# Patient Record
Sex: Female | Born: 1945
Health system: Southern US, Community
[De-identification: ages and names within clinical notes are randomized; demographics above are authoritative.]

## PROBLEM LIST (undated history)

## (undated) DIAGNOSIS — F419 Anxiety disorder, unspecified: Secondary | ICD-10-CM

## (undated) DIAGNOSIS — D649 Anemia, unspecified: Secondary | ICD-10-CM

## (undated) DIAGNOSIS — Z630 Problems in relationship with spouse or partner: Secondary | ICD-10-CM

## (undated) DIAGNOSIS — E785 Hyperlipidemia, unspecified: Secondary | ICD-10-CM

## (undated) DIAGNOSIS — F329 Major depressive disorder, single episode, unspecified: Secondary | ICD-10-CM

## (undated) DIAGNOSIS — R42 Dizziness and giddiness: Secondary | ICD-10-CM

## (undated) DIAGNOSIS — R009 Unspecified abnormalities of heart beat: Secondary | ICD-10-CM

## (undated) DIAGNOSIS — K635 Polyp of colon: Secondary | ICD-10-CM

## (undated) DIAGNOSIS — R1013 Epigastric pain: Secondary | ICD-10-CM

## (undated) DIAGNOSIS — Z780 Asymptomatic menopausal state: Secondary | ICD-10-CM

## (undated) DIAGNOSIS — K219 Gastro-esophageal reflux disease without esophagitis: Secondary | ICD-10-CM

## (undated) DIAGNOSIS — K2970 Gastritis, unspecified, without bleeding: Secondary | ICD-10-CM

## (undated) DIAGNOSIS — F32A Depression, unspecified: Secondary | ICD-10-CM

## (undated) DIAGNOSIS — M1990 Unspecified osteoarthritis, unspecified site: Secondary | ICD-10-CM

## (undated) DIAGNOSIS — I69398 Other sequelae of cerebral infarction: Secondary | ICD-10-CM

## (undated) DIAGNOSIS — I10 Essential (primary) hypertension: Secondary | ICD-10-CM

## (undated) DIAGNOSIS — R630 Anorexia: Secondary | ICD-10-CM

## (undated) DIAGNOSIS — R05 Cough: Secondary | ICD-10-CM

## (undated) HISTORY — DX: Essential (primary) hypertension: I10

## (undated) HISTORY — DX: Major depressive disorder, single episode, unspecified: F32.9

## (undated) HISTORY — DX: Depression, unspecified: F32.A

## (undated) HISTORY — DX: Epigastric pain: R10.13

## (undated) HISTORY — DX: Anorexia: R63.0

## (undated) HISTORY — DX: Hyperlipidemia, unspecified: E78.5

## (undated) HISTORY — DX: Cough: R05

## (undated) HISTORY — DX: Unspecified osteoarthritis, unspecified site: M19.90

## (undated) HISTORY — DX: Problems in relationship with spouse or partner: Z63.0

## (undated) HISTORY — PX: BREAST SURGERY: SHX581

## (undated) HISTORY — DX: Polyp of colon: K63.5

## (undated) HISTORY — DX: Gastritis, unspecified, without bleeding: K29.70

## (undated) HISTORY — DX: Gastro-esophageal reflux disease without esophagitis: K21.9

## (undated) HISTORY — DX: Unspecified abnormalities of heart beat: R00.9

## (undated) HISTORY — PX: MOUTH SURGERY: SHX715

## (undated) HISTORY — DX: Anxiety disorder, unspecified: F41.9

## (undated) HISTORY — DX: Dizziness and giddiness: R42

## (undated) HISTORY — DX: Other sequelae of cerebral infarction: I69.398

## (undated) HISTORY — DX: Asymptomatic menopausal state: Z78.0

## (undated) HISTORY — PX: TUBAL LIGATION: SHX77

## (undated) HISTORY — DX: Other sequelae of cerebral infarction: R42

## (undated) HISTORY — DX: Anemia, unspecified: D64.9

## (undated) HISTORY — PX: COLONOSCOPY: SHX174

---

## 2003-02-01 ENCOUNTER — Emergency Department (HOSPITAL_COMMUNITY): Admission: EM | Admit: 2003-02-01 | Discharge: 2003-02-01 | Payer: Self-pay | Admitting: Emergency Medicine

## 2003-08-19 ENCOUNTER — Emergency Department (HOSPITAL_COMMUNITY): Admission: AD | Admit: 2003-08-19 | Discharge: 2003-08-19 | Payer: Self-pay | Admitting: Emergency Medicine

## 2003-08-19 ENCOUNTER — Emergency Department (HOSPITAL_COMMUNITY): Admission: EM | Admit: 2003-08-19 | Discharge: 2003-08-20 | Payer: Self-pay

## 2003-08-22 ENCOUNTER — Encounter: Admission: RE | Admit: 2003-08-22 | Discharge: 2003-08-22 | Payer: Self-pay | Admitting: Internal Medicine

## 2004-09-02 ENCOUNTER — Emergency Department (HOSPITAL_COMMUNITY): Admission: EM | Admit: 2004-09-02 | Discharge: 2004-09-02 | Payer: Self-pay | Admitting: Emergency Medicine

## 2006-07-22 ENCOUNTER — Ambulatory Visit: Payer: Self-pay | Admitting: Cardiology

## 2010-07-15 ENCOUNTER — Other Ambulatory Visit: Payer: Self-pay | Admitting: Obstetrics and Gynecology

## 2010-07-15 DIAGNOSIS — R1906 Epigastric swelling, mass or lump: Secondary | ICD-10-CM

## 2010-07-17 ENCOUNTER — Ambulatory Visit
Admission: RE | Admit: 2010-07-17 | Discharge: 2010-07-17 | Disposition: A | Discharge status: Home / Self Care | Payer: Medicare Other | Source: Ambulatory Visit | Attending: Obstetrics and Gynecology | Admitting: Obstetrics and Gynecology

## 2010-07-17 DIAGNOSIS — R1906 Epigastric swelling, mass or lump: Secondary | ICD-10-CM

## 2010-07-17 MED ORDER — IOHEXOL 300 MG/ML  SOLN
100.0000 mL | Freq: Once | INTRAMUSCULAR | Status: AC | PRN
  Administered 2010-07-17: 100 mL via INTRAVENOUS

## 2010-09-18 ENCOUNTER — Encounter: Payer: Self-pay | Admitting: Gastroenterology

## 2010-09-18 ENCOUNTER — Ambulatory Visit (INDEPENDENT_AMBULATORY_CARE_PROVIDER_SITE_OTHER): Payer: Medicare Other | Admitting: Gastroenterology

## 2010-09-18 VITALS — BP 120/68 | HR 68 | Ht 63.0 in | Wt 120.0 lb

## 2010-09-18 DIAGNOSIS — R630 Anorexia: Secondary | ICD-10-CM

## 2010-09-18 DIAGNOSIS — R1013 Epigastric pain: Secondary | ICD-10-CM

## 2010-09-18 DIAGNOSIS — R634 Abnormal weight loss: Secondary | ICD-10-CM

## 2010-09-18 DIAGNOSIS — K5900 Constipation, unspecified: Secondary | ICD-10-CM

## 2010-09-18 MED ORDER — PEG-KCL-NACL-NASULF-NA ASC-C 100 G PO SOLR: 1.0000 | Freq: Once | ORAL | Status: AC

## 2010-09-18 MED ORDER — POLYETHYLENE GLYCOL 3350 17 GM/SCOOP PO POWD: ORAL | Status: DC

## 2010-09-18 NOTE — Progress Notes (Signed)
HPI: This is a  very pleasant 65 year old woman who has had upper abdominal pain, weight loss, anorexia over the past several months. She had blood tests done in March showing that she is not anemic. Her complete metabolic profile is normal. She also had a CT scan done of her abdomen pelvis and this was essentially normal except for some constipation.  Who has upper abdominal discomfort ("sore muscle") for four months.  +anorexia. She has lost 30 pounds in the past several months.  She was told she had ulcers and was put on PPI medicines.  She is not better since starting the proton pump inhibitor.  +early satiety.  These symptoms are all relatively new.  Never really takes a lot of pain medicines.  She has chronic constipation, for many many years.  Saw overt blood in stool one time 2 years ago.  She has never had colon cancer screening that I can tell.   Review of systems: Pertinent positive and negative review of systems were noted in the above HPI section.  All other review of systems was otherwise negative.   Past Medical History, Past Surgical History, Family History, Social History, Current Medications, Allergies were all reviewed with the patient via [facility] electronic medical record system.   Physical Exam: BP 120/68  Pulse 68  Ht 5\' 3"  (1.6 m)  Wt 120 lb (54.432 kg)  BMI 21.26 kg/m2 Constitutional: generally well-appearing Psychiatric: alert and oriented x3 Eyes: extraocular movements intact Mouth: oral pharynx moist, no lesions Neck: supple no lymphadenopathy Cardiovascular: heart regular rate and rhythm Lungs: clear to auscultation bilaterally Abdomen: soft, mild abdominal pain, midepigastrium, nondistended, no obvious ascites, no peritoneal signs, normal bowel sounds Extremities: no lower extremity edema bilaterally Skin: no lesions on visible extremities    Assessment and plan: 65 y.o. female with anorexia, weight loss, constipation, upper abdominal   Pain  We should proceed with colonoscopy as well as upper endoscopy. Her mother may have had gastric cancer. CT scan was underwhelming and her labs were essentially normal 2 months ago.

## 2010-09-18 NOTE — Patient Instructions (Signed)
You will be set up for a colonoscopy. You will be set up for an upper endoscopy. Until those tests, you should be on two scoops of miralax. A copy of this information will be made available to Dr. Modesto Charon.

## 2010-09-20 ENCOUNTER — Encounter: Payer: Self-pay | Admitting: Gastroenterology

## 2010-09-23 ENCOUNTER — Encounter: Payer: Self-pay | Admitting: Gastroenterology

## 2010-09-23 ENCOUNTER — Ambulatory Visit (AMBULATORY_SURGERY_CENTER): Payer: Medicare Other | Admitting: Gastroenterology

## 2010-09-23 VITALS — BP 132/86 | HR 61 | Temp 98.4°F | Resp 18 | Ht 63.0 in | Wt 120.0 lb

## 2010-09-23 DIAGNOSIS — D1330 Benign neoplasm of unspecified part of small intestine: Secondary | ICD-10-CM

## 2010-09-23 DIAGNOSIS — K3189 Other diseases of stomach and duodenum: Secondary | ICD-10-CM

## 2010-09-23 DIAGNOSIS — K2970 Gastritis, unspecified, without bleeding: Secondary | ICD-10-CM

## 2010-09-23 DIAGNOSIS — D126 Benign neoplasm of colon, unspecified: Secondary | ICD-10-CM

## 2010-09-23 DIAGNOSIS — K2990 Gastroduodenitis, unspecified, without bleeding: Secondary | ICD-10-CM

## 2010-09-23 DIAGNOSIS — Z800 Family history of malignant neoplasm of digestive organs: Secondary | ICD-10-CM

## 2010-09-23 DIAGNOSIS — R634 Abnormal weight loss: Secondary | ICD-10-CM

## 2010-09-23 LAB — HM COLONOSCOPY

## 2010-09-23 MED ORDER — SODIUM CHLORIDE 0.9 % IV SOLN: 500.0000 mL | INTRAVENOUS | Status: DC

## 2010-09-23 NOTE — Patient Instructions (Signed)
Findings:  Polyp, Hemorrhoids,  Gastritis  Teachings on Gastritis, Polyps,  And Hemorrhoids given to Pt and care giver  Discharge instructions explained and given to pt and care giver.

## 2010-09-24 ENCOUNTER — Telehealth: Payer: Self-pay

## 2010-09-24 NOTE — Telephone Encounter (Signed)

## 2012-02-05 ENCOUNTER — Encounter (HOSPITAL_COMMUNITY): Payer: Self-pay | Admitting: Emergency Medicine

## 2012-02-05 ENCOUNTER — Emergency Department (HOSPITAL_COMMUNITY): Payer: Medicare Other

## 2012-02-05 ENCOUNTER — Emergency Department (HOSPITAL_COMMUNITY)
Admission: EM | Admit: 2012-02-05 | Discharge: 2012-02-05 | Disposition: A | Discharge status: Home / Self Care | Payer: Medicare Other | Attending: Emergency Medicine | Admitting: Emergency Medicine

## 2012-02-05 DIAGNOSIS — Z886 Allergy status to analgesic agent status: Secondary | ICD-10-CM | POA: Insufficient documentation

## 2012-02-05 DIAGNOSIS — F3289 Other specified depressive episodes: Secondary | ICD-10-CM | POA: Insufficient documentation

## 2012-02-05 DIAGNOSIS — R079 Chest pain, unspecified: Secondary | ICD-10-CM

## 2012-02-05 DIAGNOSIS — F329 Major depressive disorder, single episode, unspecified: Secondary | ICD-10-CM | POA: Insufficient documentation

## 2012-02-05 DIAGNOSIS — F439 Reaction to severe stress, unspecified: Secondary | ICD-10-CM

## 2012-02-05 DIAGNOSIS — M129 Arthropathy, unspecified: Secondary | ICD-10-CM | POA: Insufficient documentation

## 2012-02-05 DIAGNOSIS — F4389 Other reactions to severe stress: Secondary | ICD-10-CM | POA: Insufficient documentation

## 2012-02-05 DIAGNOSIS — K219 Gastro-esophageal reflux disease without esophagitis: Secondary | ICD-10-CM | POA: Insufficient documentation

## 2012-02-05 DIAGNOSIS — M810 Age-related osteoporosis without current pathological fracture: Secondary | ICD-10-CM | POA: Insufficient documentation

## 2012-02-05 DIAGNOSIS — F438 Other reactions to severe stress: Secondary | ICD-10-CM | POA: Insufficient documentation

## 2012-02-05 DIAGNOSIS — I10 Essential (primary) hypertension: Secondary | ICD-10-CM | POA: Insufficient documentation

## 2012-02-05 DIAGNOSIS — F411 Generalized anxiety disorder: Secondary | ICD-10-CM | POA: Insufficient documentation

## 2012-02-05 LAB — BASIC METABOLIC PANEL
Chloride: 101 mEq/L (ref 96–112)
Creatinine, Ser: 0.82 mg/dL (ref 0.50–1.10)
GFR calc Af Amer: 85 mL/min — ABNORMAL LOW (ref >90)
Potassium: 4 mEq/L (ref 3.5–5.1)
Sodium: 135 mEq/L (ref 135–145)

## 2012-02-05 LAB — CBC
HCT: 37.3 % (ref 36.0–46.0)
Hemoglobin: 12.4 g/dL (ref 12.0–15.0)
MCV: 89.7 fL (ref 78.0–100.0)
Platelets: 317 10*3/uL (ref 150–400)
RBC: 4.16 MIL/uL (ref 3.87–5.11)
RDW: 13.5 % (ref 11.5–15.5)

## 2012-02-05 LAB — TROPONIN I: Troponin I: <0.3 ng/mL (ref <0.30)
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
## 2012-02-05 NOTE — ED Provider Notes (Signed)
History   This chart was scribed for Flint Melter, MD by Albertha Ghee Rifaie. This patient was seen in room APA12/APA12 and the patient's care was started at 19;15.  CSN: [id number]  Arrival date & time 02/05/12  4098   First MD Initiated Contact with Patient 02/05/12 1915      Chief Complaint  Patient presents with  . Chest Pain    The history is provided by the patient and a relative. No language interpreter was used.   Kathryn Stein is a 66 y.o. female who presents to the Emergency Department complaining of intermittent CP that radiates to the shoulder bilaterally and lasts about 5-10 min. Pain is worsening with breathing. She states taking aspirin but no relief.  Pt also c/o a week of intermittent neck pain. She denies taking any medications for it. She states taking citalopram for stress and meclizine. Pt reports having some stress at home attributed ther husband who aggravate her. She reports being told that she had a weak heart after her 3rd pregnancy. She denies fever, nausea, chills and emesis. Pt denies smoking and alcohol use.   Past Medical History  Diagnosis Date  . Depression   . Anorexia   . Dyspepsia   . Anemia   . Anxiety   . Arthritis   . Hypertension   . GERD (gastroesophageal reflux disease)   . Osteoporosis     Past Surgical History  Procedure Date  . Breast surgery     lt.  . Cesarean section   . Mouth surgery     bone extraction  . Colonoscopy     Family History  Problem Relation Age of Onset  . Colon cancer Mother   . Diabetes Mother     3 sisters  . Prostate cancer Brother   . Stomach cancer      niece  . Heart disease Sister   . Liver disease Brother   . Kidney cancer      niece    History  Substance Use Topics  . Smoking status: Never Smoker   . Smokeless tobacco: Never Used  . Alcohol Use: No    No OB history provided.   Review of Systems  All other systems reviewed and are negative.    Allergies  Codeine  Home  Medications   Current Outpatient Rx  Name Route Sig Dispense Refill  . ASPIRIN EC 81 MG PO TBEC Oral Take 81 mg by mouth daily as needed. For pain    . CALCIUM CITRATE-VITAMIN D 315-200 MG-UNIT PO TABS Oral Take 1 tablet by mouth 2 (two) times daily.      Marland Kitchen VITAMIN D 1000 UNITS PO TABS Oral Take 1,000 Units by mouth 2 (two) times daily.    Marland Kitchen CITALOPRAM HYDROBROMIDE 40 MG PO TABS Oral Take 40 mg by mouth daily.    Marland Kitchen MECLIZINE HCL 25 MG PO TABS Oral Take 25 mg by mouth every 8 (eight) hours as needed. For dizziness      BP 146/98  Pulse 75  Temp 98.1 F (36.7 C) (Oral)  Resp 20  Ht 5\' 5"  (1.651 m)  Wt 135 lb (61.236 kg)  BMI 22.47 kg/m2  SpO2 100%  Physical Exam  Nursing note and vitals reviewed. Constitutional: She is oriented to person, place, and time. She appears well-developed and well-nourished. No distress.  HENT:  Head: Normocephalic and atraumatic.  Eyes: Conjunctivae normal and EOM are normal.  Neck: Neck supple. No tracheal deviation present.  Cardiovascular: Normal rate, regular rhythm and normal heart sounds.        Normal heart   Pulmonary/Chest: Effort normal. No respiratory distress.       congestion in chest  Normal lungs.  Abdominal: She exhibits no distension.  Musculoskeletal: Normal range of motion.  Neurological: She is alert and oriented to person, place, and time. No sensory deficit.  Skin: Skin is dry.  Psychiatric: Her behavior is normal.       Seems depressed.     ED Course  Procedures (including critical care time) DIAGNOSTIC STUDIES: Oxygen Saturation is 100% on room air, normal by my interpretation.    COORDINATION OF CARE: 7:33 PM Discussed treatment plan with pt at bedside and pt agreed to plan. 9:05 PM Revaluated and pt reports feeling better, test results were shared as negative and pt was advised to follow up with PCP; discharge plans were discussed with pt at bedside and pt agreed to plans.   Date: 02/05/2012  Rate: 61  Rhythm:  normal sinus rhythm  QRS Axis: normal  Intervals: normal  ST/T Wave abnormalities: normal  Conduction Disutrbances:none  Narrative Interpretation:   Old EKG Reviewed: none available   Labs Reviewed  BASIC METABOLIC PANEL - Abnormal; Notable for the following:    GFR calc non Af Amer 73 (*)     GFR calc Af Amer 85 (*)     All other components within normal limits  CBC  TROPONIN I  LAB REPORT - SCANNED   Dg Chest Port 1 View  02/05/2012  *RADIOLOGY REPORT*  Clinical Data: Chest pain and shortness of breath.  PORTABLE CHEST - 1 VIEW  Comparison: Chest x-ray 09/02/2004.  Findings: Lung volumes are normal.  No consolidative airspace disease.  No definite pleural effusions.  Pulmonary vasculature is normal.  Heart size is upper limits of normal, and likely accentuated by patient rotation, lordotic positioning and portable AP technique.  Mediastinal contours are within normal limits allowing for the above factors.  IMPRESSION: 1.  No radiographic evidence of acute cardiopulmonary disease.   Original Report Authenticated By: Florencia Reasons, M.D.      1. Chest pain   2. Stress       MDM  Nonspecific CP with negative ED evaluation and underlying stress d/t issues with husband. Doubt ACS, PE, PNE. Doubt metabolic instability, serious bacterial infection or impending vascular collapse; the patient is stable for discharge.    I personally performed the services described in this documentation, which was scribed in my presence. The recorded information has been reviewed and considered.    Plan: Home Medications- usual; Home Treatments- rest, stress management; Recommended follow up- PCP f/u in 4-5 days        Flint Melter, MD 02/07/12 1151
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
## 2012-02-05 NOTE — ED Notes (Signed)
Pt c/o left chest/shoulder pain intermittently since 8am today.

## 2012-07-11 ENCOUNTER — Encounter: Payer: Self-pay | Admitting: Family Medicine

## 2012-07-11 DIAGNOSIS — Z860100 Personal history of colon polyps, unspecified: Secondary | ICD-10-CM | POA: Insufficient documentation

## 2012-07-11 DIAGNOSIS — F32A Depression, unspecified: Secondary | ICD-10-CM | POA: Insufficient documentation

## 2012-07-11 DIAGNOSIS — M810 Age-related osteoporosis without current pathological fracture: Secondary | ICD-10-CM | POA: Insufficient documentation

## 2012-07-11 DIAGNOSIS — F3289 Other specified depressive episodes: Secondary | ICD-10-CM

## 2012-07-11 DIAGNOSIS — F329 Major depressive disorder, single episode, unspecified: Secondary | ICD-10-CM | POA: Insufficient documentation

## 2012-07-24 ENCOUNTER — Other Ambulatory Visit: Payer: Self-pay | Admitting: *Deleted

## 2012-07-24 DIAGNOSIS — M810 Age-related osteoporosis without current pathological fracture: Secondary | ICD-10-CM

## 2012-07-30 ENCOUNTER — Encounter: Payer: Self-pay | Admitting: Family Medicine

## 2012-07-30 ENCOUNTER — Ambulatory Visit (INDEPENDENT_AMBULATORY_CARE_PROVIDER_SITE_OTHER): Payer: Medicare Other | Admitting: Family Medicine

## 2012-07-30 VITALS — BP 125/75 | HR 66 | Temp 97.4°F | Ht 63.0 in | Wt 144.0 lb

## 2012-07-30 DIAGNOSIS — F3289 Other specified depressive episodes: Secondary | ICD-10-CM

## 2012-07-30 DIAGNOSIS — Z13220 Encounter for screening for lipoid disorders: Secondary | ICD-10-CM

## 2012-07-30 DIAGNOSIS — F32A Depression, unspecified: Secondary | ICD-10-CM

## 2012-07-30 DIAGNOSIS — F329 Major depressive disorder, single episode, unspecified: Secondary | ICD-10-CM

## 2012-07-30 DIAGNOSIS — M810 Age-related osteoporosis without current pathological fracture: Secondary | ICD-10-CM

## 2012-07-30 DIAGNOSIS — F4329 Adjustment disorder with other symptoms: Secondary | ICD-10-CM

## 2012-07-30 DIAGNOSIS — F438 Other reactions to severe stress: Secondary | ICD-10-CM

## 2012-07-30 DIAGNOSIS — F4389 Other reactions to severe stress: Secondary | ICD-10-CM

## 2012-07-30 LAB — LIPID PANEL
Cholesterol: 201 mg/dL — ABNORMAL HIGH (ref 0–200)
HDL: 50 mg/dL (ref >39)
LDL Cholesterol: 139 mg/dL — ABNORMAL HIGH (ref 0–99)
Total CHOL/HDL Ratio: 4 Ratio
Triglycerides: 60 mg/dL (ref <150)
VLDL: 12 mg/dL (ref 0–40)

## 2012-07-30 MED ORDER — CITALOPRAM HYDROBROMIDE 40 MG PO TABS: 40.0000 mg | ORAL_TABLET | Freq: Every day | ORAL | Status: DC

## 2012-07-30 NOTE — Progress Notes (Signed)
Subjective:     Patient ID: Kathryn Stein, female   DOB: 1945/11/22, 67 y.o.   MRN: [id number]  HPI Patient comes in for followup of her depression. Her husband is now attending church. He is also participating in the family dynamics and being a positive influence. One of the grandkids he is still using marijuana. One of the kids have stopped using marijuana. She is happy about progress made in the family problems. She is sleeping better. No suicidal ideation. No hallucination. She is able to function and cope with  daily activities quite well. She does need a refill on the citalopram. On questioning patient has not had a lipid panel recently she is fasting. She does have osteoporosis and is on Forteo injections. And is doing well with that without any side effects. She is followed by the pharmacist here at [facility] family medicine.  Past Medical History  Diagnosis Date  . Depression   . Anorexia   . Dyspepsia   . Anemia   . Anxiety   . Arthritis   . Hypertension   . GERD (gastroesophageal reflux disease)   . Osteoporosis   . Colon polyps   . Gastritis     found on EGD  . Marital relationship problem   . Vertigo due to previous cerebellar infarction   . Postmenopausal    Past Surgical History  Procedure Laterality Date  . Cesarean section    . Mouth surgery      bone extraction  . Colonoscopy    . Breast surgery Left     biopsy benign  . Tubal ligation     History   Social History  . Marital Status: Married    Spouse Name: N/A    Number of Children: 5  . Years of Education: N/A   Occupational History  . disability    Social History Main Topics  . Smoking status: Never Smoker   . Smokeless tobacco: Never Used  . Alcohol Use: No  . Drug Use: No  . Sexually Active: Not on file   Other Topics Concern  . Not on file   Social History Narrative  . No narrative on file   Family History  Problem Relation Age of Onset  . Colon cancer Mother   .  Diabetes Mother     3 sisters  . Prostate cancer Brother   . Stomach cancer      niece  . Kidney cancer      niece  . Heart disease Sister   . Liver disease Brother   . Stroke Father   . Diabetes Sister   . Heart disease Sister    Current Outpatient Prescriptions on File Prior to Visit  Medication Sig Dispense Refill  . calcium citrate-vitamin D (CITRACAL+D) 315-200 MG-UNIT per tablet Take 1 tablet by mouth 2 (two) times daily.        . cholecalciferol (VITAMIN D) 1000 UNITS tablet Take 1,000 Units by mouth 2 (two) times daily.      . Teriparatide, Recombinant, (FORTEO) 600 MCG/2.4ML SOLN Inject 20 mcg into the skin daily.      Marland Kitchen aspirin EC 81 MG tablet Take 81 mg by mouth daily as needed. For pain      . meclizine (ANTIVERT) 25 MG tablet Take 25 mg by mouth every 8 (eight) hours as needed. For dizziness       Current Facility-Administered Medications on File Prior to Visit  Medication Dose Route Frequency Provider Last Rate Last  Dose  . 0.9 %  sodium chloride infusion  500 mL Intravenous Continuous Rachael Fee, MD       Allergies  Allergen Reactions  . Codeine Nausea And Vomiting   Immunization History  Administered Date(s) Administered  . DTaP 07/12/2010  . Influenza-Generic 02/23/2012  . Pneumococcal Polysaccharide 12/23/2011   Prior to Admission medications   Medication Sig Start Date End Date Taking? Authorizing Provider  calcium citrate-vitamin D (CITRACAL+D) 315-200 MG-UNIT per tablet Take 1 tablet by mouth 2 (two) times daily.     Yes Historical Provider, MD  cholecalciferol (VITAMIN D) 1000 UNITS tablet Take 1,000 Units by mouth 2 (two) times daily.   Yes Historical Provider, MD  citalopram (CELEXA) 40 MG tablet Take 1 tablet (40 mg total) by mouth daily. 07/30/12  Yes Ileana Ladd, MD  Teriparatide, Recombinant, (FORTEO) 600 MCG/2.4ML SOLN Inject 20 mcg into the skin daily. 09/11/10 09/10/12 Yes Historical Provider, MD  aspirin EC 81 MG tablet Take 81 mg by mouth  daily as needed. For pain    Historical Provider, MD  meclizine (ANTIVERT) 25 MG tablet Take 25 mg by mouth every 8 (eight) hours as needed. For dizziness    Historical Provider, MD     Review of Systems  All other systems reviewed and are negative.       Objective:   Physical Exam BP 125/75  Pulse 66  Temp(Src) 97.4 F (36.3 C) (Oral)  Ht 5\' 3"  (1.6 m)  Wt 144 lb (65.318 kg)  BMI 25.51 kg/m2 On examination she appeared in good health and spirits. Well developed, well nourished. Vital signs as documented.  Skin warm and dry and without overt rashes. Head & Neck without JVD. Lungs clear.  Heart exam notable for regular rhythm, normal sounds and absence of murmurs, rubs or gallops. Abdomen unremarkable and without evidence of organomegaly, masses, or abdominal aortic enlargement.  Breast exam: not performed. Gyn Exam: Not performed. External Genitalia: Vagina:: Cervix: Uterus: Adnexae: R/V: Extremities nonedematous.    Assessment:     1 depression doing very well on present regimen. 2 osteoporosis doing well on Forteo. 3 screening for cholesterol. 4 marital discord this has resolved.    Plan:     Supportive therapy. Patient counseled on wellness. If her depression relapses she should call stat. If there is any thoughts of homicide or suicide she should proceed to behavioral health emergently. Barbara patient doesn't quite well on present regimen. Her citalopram was refilled. Labs was ordered. Will see her back for followup in 4 months.  Giann Obara P. Modesto Charon, M.D.

## 2012-09-01 ENCOUNTER — Ambulatory Visit: Payer: Self-pay

## 2012-09-01 ENCOUNTER — Other Ambulatory Visit: Payer: Self-pay

## 2012-11-10 ENCOUNTER — Telehealth: Payer: Self-pay

## 2012-11-10 NOTE — Telephone Encounter (Signed)
Patient said she missed two appts you referred her to and needs to set up again

## 2012-11-16 NOTE — Telephone Encounter (Signed)
Can wait until follow up appointment.

## 2012-11-16 NOTE — Telephone Encounter (Signed)
Pt aware.

## 2012-11-30 ENCOUNTER — Ambulatory Visit (INDEPENDENT_AMBULATORY_CARE_PROVIDER_SITE_OTHER): Payer: Medicare Other | Admitting: Family Medicine

## 2012-11-30 ENCOUNTER — Encounter: Payer: Self-pay | Admitting: Family Medicine

## 2012-11-30 VITALS — BP 128/81 | HR 64 | Temp 97.5°F | Ht 63.0 in | Wt 130.2 lb

## 2012-11-30 DIAGNOSIS — Z860100 Personal history of colon polyps, unspecified: Secondary | ICD-10-CM

## 2012-11-30 DIAGNOSIS — R634 Abnormal weight loss: Secondary | ICD-10-CM

## 2012-11-30 DIAGNOSIS — M810 Age-related osteoporosis without current pathological fracture: Secondary | ICD-10-CM

## 2012-11-30 DIAGNOSIS — R630 Anorexia: Secondary | ICD-10-CM

## 2012-11-30 DIAGNOSIS — F3289 Other specified depressive episodes: Secondary | ICD-10-CM

## 2012-11-30 DIAGNOSIS — F329 Major depressive disorder, single episode, unspecified: Secondary | ICD-10-CM

## 2012-11-30 LAB — COMPLETE METABOLIC PANEL WITH GFR
ALT: 9 U/L (ref 0–35)
AST: 17 U/L (ref 0–37)
Albumin: 4.5 g/dL (ref 3.5–5.2)
Alkaline Phosphatase: 60 U/L (ref 39–117)
BUN: 19 mg/dL (ref 6–23)
CO2: 31 mEq/L (ref 19–32)
Calcium: 10 mg/dL (ref 8.4–10.5)
Chloride: 102 mEq/L (ref 96–112)
Creat: 0.87 mg/dL (ref 0.50–1.10)
GFR, Est African American: 80 mL/min
GFR, Est Non African American: 70 mL/min
Glucose, Bld: 74 mg/dL (ref 70–99)
Potassium: 4.4 mEq/L (ref 3.5–5.3)
Sodium: 139 mEq/L (ref 135–145)
Total Bilirubin: 0.7 mg/dL (ref 0.3–1.2)
Total Protein: 7 g/dL (ref 6.0–8.3)

## 2012-11-30 LAB — POCT CBC
Granulocyte percent: 60.8 %G (ref 37–80)
HCT, POC: 38.3 % (ref 37.7–47.9)
Hemoglobin: 12.7 g/dL (ref 12.2–16.2)
Lymph, poc: 1.7 (ref 0.6–3.4)
MCH, POC: 29.5 pg (ref 27–31.2)
MCHC: 33.1 g/dL (ref 31.8–35.4)
MCV: 89.1 fL (ref 80–97)
MPV: 6.9 fL (ref 0–99.8)
POC Granulocyte: 3.2 (ref 2–6.9)
POC LYMPH PERCENT: 33.4 %L (ref 10–50)
Platelet Count, POC: 306 10*3/uL (ref 142–424)
RBC: 4.3 M/uL (ref 4.04–5.48)
RDW, POC: 13.3 %
WBC: 5.2 10*3/uL (ref 4.6–10.2)

## 2012-11-30 LAB — TSH: TSH: 2.214 u[IU]/mL (ref 0.350–4.500)

## 2012-11-30 LAB — LIPID PANEL
Cholesterol: 224 mg/dL — ABNORMAL HIGH (ref 0–200)
HDL: 61 mg/dL (ref >39)
LDL Cholesterol: 153 mg/dL — ABNORMAL HIGH (ref 0–99)
Total CHOL/HDL Ratio: 3.7 Ratio
Triglycerides: 49 mg/dL (ref <150)
VLDL: 10 mg/dL (ref 0–40)

## 2012-11-30 MED ORDER — CYPROHEPTADINE HCL 4 MG PO TABS: 4.0000 mg | ORAL_TABLET | Freq: Three times a day (TID) | ORAL | Status: DC | PRN

## 2012-11-30 NOTE — Progress Notes (Signed)
Patient ID: Kathryn Stein, female   DOB: Sep 25, 1945, 67 y.o.   MRN: [id number] SUBJECTIVE: CC: Chief Complaint  Patient presents with  . Follow-up    states appetite poor. fasting    HPI: Marital problems again. arguing with husband about everything. He stopped going to church again. No appetite is poor. Lost weight again. Sleep poor. Sleeps and wakes every 45 minutes. Some of the grandkids moved out. The one at her house has quit using drugs. Night sweats? No melena, no blood in the stools. Constipated again. Not eating due to dissatisfaction with husband and home life like  Before. She has had EGD and colonoscopy in 2012 with similar symptoms and it resolved when husband participated with church and her personal life.  Past Medical History  Diagnosis Date  . Depression   . Anorexia   . Dyspepsia   . Anemia   . Anxiety   . Arthritis   . Hypertension   . GERD (gastroesophageal reflux disease)   . Osteoporosis   . Colon polyps   . Gastritis     found on EGD  . Marital relationship problem   . Vertigo due to previous cerebellar infarction   . Postmenopausal   . Vertigo    Past Surgical History  Procedure Laterality Date  . Cesarean section    . Mouth surgery      bone extraction  . Colonoscopy    . Breast surgery Left     biopsy benign  . Tubal ligation     History   Social History  . Marital Status: Married    Spouse Name: N/A    Number of Children: 5  . Years of Education: N/A   Occupational History  . disability    Social History Main Topics  . Smoking status: Never Smoker   . Smokeless tobacco: Never Used  . Alcohol Use: No  . Drug Use: No  . Sexually Active: Not on file   Other Topics Concern  . Not on file   Social History Narrative  . No narrative on file   Family History  Problem Relation Age of Onset  . Colon cancer Mother   . Diabetes Mother     3 sisters  . Prostate cancer Brother   . Stomach cancer      niece  . Kidney cancer     niece  . Heart disease Sister   . Liver disease Brother   . Stroke Father   . Diabetes Sister   . Heart disease Sister    Current Outpatient Prescriptions on File Prior to Visit  Medication Sig Dispense Refill  . aspirin EC 81 MG tablet Take 81 mg by mouth daily as needed. For pain      . calcium citrate-vitamin D (CITRACAL+D) 315-200 MG-UNIT per tablet Take 1 tablet by mouth 2 (two) times daily.        . cholecalciferol (VITAMIN D) 1000 UNITS tablet Take 1,000 Units by mouth 2 (two) times daily.      . citalopram (CELEXA) 40 MG tablet Take 1 tablet (40 mg total) by mouth daily.  30 tablet  5   No current facility-administered medications on file prior to visit.   Allergies  Allergen Reactions  . Codeine Nausea And Vomiting  . Darvocet (Propoxyphene-Acetaminophen)    Immunization History  Administered Date(s) Administered  . DTaP 07/12/2010  . Influenza-Generic 02/23/2012  . Pneumococcal Polysaccharide 12/23/2011   Prior to Admission medications   Medication Sig Start Date  End Date Taking? Authorizing Provider  aspirin EC 81 MG tablet Take 81 mg by mouth daily as needed. For pain   Yes Historical Provider, MD  calcium citrate-vitamin D (CITRACAL+D) 315-200 MG-UNIT per tablet Take 1 tablet by mouth 2 (two) times daily.     Yes Historical Provider, MD  cholecalciferol (VITAMIN D) 1000 UNITS tablet Take 1,000 Units by mouth 2 (two) times daily.   Yes Historical Provider, MD  citalopram (CELEXA) 40 MG tablet Take 1 tablet (40 mg total) by mouth daily. 07/30/12  Yes Ileana Ladd, MD  cyproheptadine (PERIACTIN) 4 MG tablet Take 1 tablet (4 mg total) by mouth 3 (three) times daily as needed. 11/30/12   Ileana Ladd, MD     ROS: As above in the HPI. All other systems are stable or negative.  OBJECTIVE: APPEARANCE:  Patient in no acute distress.The patient appeared well nourished and normally developed. Acyanotic. Waist: VITAL SIGNS:BP 128/81  Pulse 64  Temp(Src) 97.5 F (36.4  C) (Oral)  Ht 5\' 3"  (1.6 m)  Wt 130 lb 3.2 oz (59.058 kg)  BMI 23.07 kg/m2 AAF with some weight loss since last visit.  SKIN: warm and  Dry without overt rashes, tattoos and scars  HEAD and Neck: without JVD, Head and scalp: normal Eyes:No scleral icterus. Fundi normal, eye movements normal. Ears: Auricle normal, canal normal, Tympanic membranes normal, insufflation normal. Nose: normal Throat: normal Neck & thyroid: normal  CHEST & LUNGS: Chest wall: normal Lungs: Clear  CVS: Reveals the PMI to be normally located. Regular rhythm, First and Second Heart sounds are normal,  absence of murmurs, rubs or gallops. Peripheral vasculature: Radial pulses: normal Dorsal pedis pulses: normal Posterior pulses: normal  ABDOMEN:  Appearance: normal Benign, no organomegaly, no masses, no Abdominal Aortic enlargement. No Guarding , no rebound. No Bruits. Bowel sounds: normal  RECTAL: N/A GU: N/A  EXTREMETIES: nonedematous. Both Femoral and Pedal pulses are normal.  MUSCULOSKELETAL:  Spine: normal Joints: intact  NEUROLOGIC: oriented to time,place and person; nonfocal. Strength is normal Sensory is normal Reflexes are normal Cranial Nerves are normal.  PSYCHIATRY: sad, not suicidal, not hallucinating. Patient has been taking her citalopram and it has been doing well until recent disagreements at home. Patient's pastor was her counselor but he  Recently died.  ASSESSMENT: Anorexia - Plan: cyproheptadine (PERIACTIN) 4 MG tablet, POCT CBC  Loss of weight - Plan: POCT CBC, COMPLETE METABOLIC PANEL WITH GFR, TSH, Lipid panel  Depressive disorder, not elsewhere classified - Plan: TSH  Personal history of colonic polyps  Osteoporosis, unspecified  PLAN: Orders Placed This Encounter  Procedures  . COMPLETE METABOLIC PANEL WITH GFR  . TSH  . Lipid panel  . POCT CBC   Meds ordered this encounter  Medications  . cyproheptadine (PERIACTIN) 4 MG tablet    Sig: Take 1  tablet (4 mg total) by mouth 3 (three) times daily as needed.    Dispense:  90 tablet    Refill:  1   Results for orders placed in visit on 07/30/12  LIPID PANEL      Result Value Range   Cholesterol 201 (*) 0 - 200 mg/dL   Triglycerides 60  <962 mg/dL   HDL 50  >95 mg/dL   Total CHOL/HDL Ratio 4.0     VLDL 12  0 - 40 mg/dL   LDL Cholesterol 284 (*) 0 - 99 mg/dL   Advised to seek a new counselor at church or at [employer]. Let us know  if she needs help finding a new one. Supportive therapy.  Return in about 6 weeks (around 01/11/2013) for Recheck medical problems.  Jamieka Royle P. Modesto Charon, M.D.

## 2012-11-30 NOTE — Patient Instructions (Signed)
Stress Stress-related medical problems are becoming increasingly common. The body has a built-in physical response to stressful situations. Faced with pressure, challenge or danger, we need to react quickly. Our bodies release hormones such as cortisol and adrenaline to help do this. These hormones are part of the "fight or flight" response and affect the metabolic rate, heart rate and blood pressure, resulting in a heightened, stressed state that prepares the body for optimum performance in dealing with a stressful situation. It is likely that early man required these mechanisms to stay alive, but usually modern stresses do not call for this, and the same hormones released in today's world can damage health and reduce coping ability. CAUSES  Pressure to perform at work, at school or in sports.  Threats of physical violence.  Money worries.  Arguments.  Family conflicts.  Divorce or separation from significant other.  Bereavement.  New job or unemployment.  Changes in location.  Alcohol or drug abuse. SOMETIMES, THERE IS NO PARTICULAR REASON FOR DEVELOPING STRESS. Almost all people are at risk of being stressed at some time in their lives. It is important to know that some stress is temporary and some is long term.  Temporary stress will go away when a situation is resolved. Most people can cope with short periods of stress, and it can often be relieved by relaxing, taking a walk, chatting through issues with friends, or having a good night's sleep.  Chronic (long-term, continuous) stress is much harder to deal with. It can be psychologically and emotionally damaging. It can be harmful both for an individual and for friends and family. SYMPTOMS Everyone reacts to stress differently. There are some common effects that help us recognize it. In times of extreme stress, people may:  Shake uncontrollably.  Breathe faster and deeper than normal (hyperventilate).  Vomit.  For people  with asthma, stress can trigger an attack.  For some people, stress may trigger migraine headaches, ulcers, and body pain. PHYSICAL EFFECTS OF STRESS MAY INCLUDE:  Loss of energy.  Skin problems.  Aches and pains resulting from tense muscles, including neck ache, backache and tension headaches.  Increased pain from arthritis and other conditions.  Irregular heart beat (palpitations).  Periods of irritability or anger.  Apathy or depression.  Anxiety (feeling uptight or worrying).  Unusual behavior.  Loss of appetite.  Comfort eating.  Lack of concentration.  Loss of, or decreased, sex-drive.  Increased smoking, drinking, or recreational drug use.  For women, missed periods.  Ulcers, joint pain, and muscle pain. Post-traumatic stress is the stress caused by any serious accident, strong emotional damage, or extremely difficult or violent experience such as rape or war. Post-traumatic stress victims can experience mixtures of emotions such as fear, shame, depression, guilt or anger. It may include recurrent memories or images that may be haunting. These feelings can last for weeks, months or even years after the traumatic event that triggered them. Specialized treatment, possibly with medicines and psychological therapies, is available. If stress is causing physical symptoms, severe distress or making it difficult for you to function as normal, it is worth seeing your caregiver. It is important to remember that although stress is a usual part of life, extreme or prolonged stress can lead to other illnesses that will need treatment. It is better to visit a doctor sooner rather than later. Stress has been linked to the development of high blood pressure and heart disease, as well as insomnia and depression. There is no diagnostic test for   stress since everyone reacts to it differently. But a caregiver will be able to spot the physical symptoms, such  as:  Headaches.  Shingles.  Ulcers. Emotional distress such as intense worry, low mood or irritability should be detected when the doctor asks pertinent questions to identify any underlying problems that might be the cause. In case there are physical reasons for the symptoms, the doctor may also want to do some tests to exclude certain conditions. If you feel that you are suffering from stress, try to identify the aspects of your life that are causing it. Sometimes you may not be able to change or avoid them, but even a small change can have a positive ripple effect. A simple lifestyle change can make all the difference. STRATEGIES THAT CAN HELP DEAL WITH STRESS:  Delegating or sharing responsibilities.  Avoiding confrontations.  Learning to be more assertive.  Regular exercise.  Avoid using alcohol or street drugs to cope.  Eating a healthy, balanced diet, rich in fruit and vegetables and proteins.  Finding humor or absurdity in stressful situations.  Never taking on more than you know you can handle comfortably.  Organizing your time better to get as much done as possible.  Talking to friends or family and sharing your thoughts and fears.  Listening to music or relaxation tapes.  Tensing and then relaxing your muscles, starting at the toes and working up to the head and neck. If you think that you would benefit from help, either in identifying the things that are causing your stress or in learning techniques to help you relax, see a caregiver who is capable of helping you with this. Rather than relying on medications, it is usually better to try and identify the things in your life that are causing stress and try to deal with them. There are many techniques of managing stress including counseling, psychotherapy, aromatherapy, yoga, and exercise. Your caregiver can help you determine what is best for you. Document Released: 07/19/2002 Document Revised: 07/21/2011 Document  Reviewed: 06/15/2007 [employer] Patient Information 2014 [employer].  

## 2012-11-30 NOTE — Progress Notes (Signed)
Quick Note:  Lab result at goal. The LDLc is high however the HDLc is great at 61. The good cholesterol balances the bad. No change in Medications for now. No Change in plans and follow up. ______

## 2012-12-06 ENCOUNTER — Telehealth: Payer: Self-pay | Admitting: Family Medicine

## 2012-12-07 NOTE — Telephone Encounter (Signed)
These have been done, pt aware

## 2012-12-09 ENCOUNTER — Telehealth: Payer: Self-pay | Admitting: Family Medicine

## 2012-12-09 NOTE — Telephone Encounter (Signed)
Pt notified that meds are at [facility] --spoke to [place] at [place]

## 2012-12-21 ENCOUNTER — Telehealth: Payer: Self-pay | Admitting: Family Medicine

## 2012-12-21 NOTE — Telephone Encounter (Signed)
Spoke with pt and informed her I had spoken with Silvio Pate at [facility] and med would cost 30.00 dollars and pt stated could afford it.

## 2012-12-21 NOTE — Telephone Encounter (Signed)
Cheapest can even buy it cash. It is generic.

## 2012-12-22 ENCOUNTER — Telehealth: Payer: Self-pay | Admitting: *Deleted

## 2012-12-22 NOTE — Telephone Encounter (Signed)
Medicare will not cover cyproheptadine hcl  Because all drugs used for  Anorexia, weight loss or weight gain are excluded from coverage under medicare rules.

## 2012-12-22 NOTE — Telephone Encounter (Signed)
I thought we dealt with this yesterday.

## 2012-12-23 NOTE — Telephone Encounter (Signed)
Spoke with pt and she states she went and got the medicine yest at [facility].

## 2013-01-11 ENCOUNTER — Ambulatory Visit: Payer: Medicare Other | Admitting: Family Medicine

## 2013-01-18 ENCOUNTER — Encounter: Payer: Self-pay | Admitting: Family Medicine

## 2013-01-18 ENCOUNTER — Ambulatory Visit (INDEPENDENT_AMBULATORY_CARE_PROVIDER_SITE_OTHER): Payer: Medicare Other | Admitting: Family Medicine

## 2013-01-18 VITALS — BP 137/79 | HR 80 | Temp 97.3°F | Wt 137.8 lb

## 2013-01-18 DIAGNOSIS — E441 Mild protein-calorie malnutrition: Secondary | ICD-10-CM | POA: Insufficient documentation

## 2013-01-18 DIAGNOSIS — F329 Major depressive disorder, single episode, unspecified: Secondary | ICD-10-CM

## 2013-01-18 DIAGNOSIS — M549 Dorsalgia, unspecified: Secondary | ICD-10-CM | POA: Insufficient documentation

## 2013-01-18 DIAGNOSIS — E46 Unspecified protein-calorie malnutrition: Secondary | ICD-10-CM | POA: Insufficient documentation

## 2013-01-18 DIAGNOSIS — F3289 Other specified depressive episodes: Secondary | ICD-10-CM

## 2013-01-18 DIAGNOSIS — Z860100 Personal history of colon polyps, unspecified: Secondary | ICD-10-CM

## 2013-01-18 DIAGNOSIS — R630 Anorexia: Secondary | ICD-10-CM

## 2013-01-18 DIAGNOSIS — M810 Age-related osteoporosis without current pathological fracture: Secondary | ICD-10-CM

## 2013-01-18 NOTE — Progress Notes (Signed)
Patient ID: Kathryn Stein, female   DOB: 1945/08/07, 67 y.o.   MRN: [id number] SUBJECTIVE: CC: Chief Complaint  Patient presents with  . Follow-up    6 week follow up states appetite better. feels like "catch " in lower back with pain in  left leg     HPI:  Family stress getting better. The events are settling down. Has a catch in the lower back when she bends over. No weakness , no numbness in the legs. No radiculopathy.  Depression is better. Not suicidal. Coping better and back to baseline.apetite picked up and gained back weight. Sleeping better. No paasive death wish and no suicidal ideation.  Past Medical History  Diagnosis Date  . Depression   . Anorexia   . Dyspepsia   . Anemia   . Anxiety   . Arthritis   . Hypertension   . GERD (gastroesophageal reflux disease)   . Osteoporosis   . Colon polyps   . Gastritis     found on EGD  . Marital relationship problem   . Vertigo due to previous cerebellar infarction   . Postmenopausal   . Vertigo    Past Surgical History  Procedure Laterality Date  . Cesarean section    . Mouth surgery      bone extraction  . Colonoscopy    . Breast surgery Left     biopsy benign  . Tubal ligation     History   Social History  . Marital Status: Married    Spouse Name: N/A    Number of Children: 5  . Years of Education: N/A   Occupational History  . disability    Social History Main Topics  . Smoking status: Never Smoker   . Smokeless tobacco: Never Used  . Alcohol Use: No  . Drug Use: No  . Sexual Activity: Not on file   Other Topics Concern  . Not on file   Social History Narrative  . No narrative on file   Family History  Problem Relation Age of Onset  . Colon cancer Mother   . Diabetes Mother     3 sisters  . Prostate cancer Brother   . Stomach cancer      niece  . Kidney cancer      niece  . Heart disease Sister   . Liver disease Brother   . Stroke Father   . Diabetes Sister   . Heart disease Sister     Current Outpatient Prescriptions on File Prior to Visit  Medication Sig Dispense Refill  . aspirin EC 81 MG tablet Take 81 mg by mouth daily as needed. For pain      . calcium citrate-vitamin D (CITRACAL+D) 315-200 MG-UNIT per tablet Take 1 tablet by mouth 2 (two) times daily.        . cholecalciferol (VITAMIN D) 1000 UNITS tablet Take 1,000 Units by mouth 2 (two) times daily.      . citalopram (CELEXA) 40 MG tablet Take 1 tablet (40 mg total) by mouth daily.  30 tablet  5  . cyproheptadine (PERIACTIN) 4 MG tablet Take 1 tablet (4 mg total) by mouth 3 (three) times daily as needed.  90 tablet  1   No current facility-administered medications on file prior to visit.   Allergies  Allergen Reactions  . Codeine Nausea And Vomiting  . Darvocet [Propoxyphene-Acetaminophen]    Immunization History  Administered Date(s) Administered  . DTaP 07/12/2010  . Influenza-Generic 02/23/2012  . Pneumococcal Polysaccharide  12/23/2011   Prior to Admission medications   Medication Sig Start Date End Date Taking? Authorizing Provider  aspirin EC 81 MG tablet Take 81 mg by mouth daily as needed. For pain   Yes Historical Provider, MD  calcium citrate-vitamin D (CITRACAL+D) 315-200 MG-UNIT per tablet Take 1 tablet by mouth 2 (two) times daily.     Yes Historical Provider, MD  cholecalciferol (VITAMIN D) 1000 UNITS tablet Take 1,000 Units by mouth 2 (two) times daily.   Yes Historical Provider, MD  citalopram (CELEXA) 40 MG tablet Take 1 tablet (40 mg total) by mouth daily. 07/30/12  Yes Ileana Ladd, MD  cyproheptadine (PERIACTIN) 4 MG tablet Take 1 tablet (4 mg total) by mouth 3 (three) times daily as needed. 11/30/12  Yes Ileana Ladd, MD    ROS: As above in the HPI. All other systems are stable or negative.  OBJECTIVE: APPEARANCE:  Patient in no acute distress.The patient appeared well nourished and normally developed. Acyanotic. Waist: VITAL SIGNS:BP 137/79  Pulse 80  Temp(Src) 97.3 F  (36.3 C) (Oral)  Wt 137 lb 12.8 oz (62.506 kg)  BMI 24.42 kg/m2 AAF  SKIN: warm and  Dry without overt rashes, tattoos and scars  HEAD and Neck: without JVD, Head and scalp: normal Eyes:No scleral icterus. Fundi normal, eye movements normal. Ears: Auricle normal, canal normal, Tympanic membranes normal, insufflation normal. Nose: normal Throat: normal Neck & thyroid: normal  CHEST & LUNGS: Chest wall: normal Lungs: Clear  CVS: Reveals the PMI to be normally located. Regular rhythm, First and Second Heart sounds are normal,  absence of murmurs, rubs or gallops. Peripheral vasculature: Radial pulses: normal Dorsal pedis pulses: normal Posterior pulses: normal  ABDOMEN:  Appearance: normal Benign, no organomegaly, no masses, no Abdominal Aortic enlargement. No Guarding , no rebound. No Bruits. Bowel sounds: normal  RECTAL: N/A GU: N/A  EXTREMETIES: nonedematous.  MUSCULOSKELETAL:  Spine: FROM, but has a sharp pain on twisting and bending at L3-L4 area. SLR was negative. Joints: intact  NEUROLOGIC: oriented to time,place and person; nonfocal. Strength is normal Sensory is normal Reflexes are normal Cranial Nerves are normal.   Psychiatry. Stable.  ASSESSMENT: Depressive disorder, not elsewhere classified  Osteoporosis, unspecified  Personal history of colonic polyps  Anorexia symptom  Back pain - musculo=skeletal.    PLAN: Results for orders placed in visit on 11/30/12  COMPLETE METABOLIC PANEL WITH GFR      Result Value Range   Sodium 139  135 - 145 mEq/L   Potassium 4.4  3.5 - 5.3 mEq/L   Chloride 102  96 - 112 mEq/L   CO2 31  19 - 32 mEq/L   Glucose, Bld 74  70 - 99 mg/dL   BUN 19  6 - 23 mg/dL   Creat 2.13  0.86 - 5.78 mg/dL   Total Bilirubin 0.7  0.3 - 1.2 mg/dL   Alkaline Phosphatase 60  39 - 117 U/L   AST 17  0 - 37 U/L   ALT 9  0 - 35 U/L   Total Protein 7.0  6.0 - 8.3 g/dL   Albumin 4.5  3.5 - 5.2 g/dL   Calcium 46.9  8.4 - 62.9  mg/dL   GFR, Est African American 80     GFR, Est Non African American 70    TSH      Result Value Range   TSH 2.214  0.350 - 4.500 uIU/mL  LIPID PANEL      Result Value  Range   Cholesterol 224 (*) 0 - 200 mg/dL   Triglycerides 49  <098 mg/dL   HDL 61  >11 mg/dL   Total CHOL/HDL Ratio 3.7     VLDL 10  0 - 40 mg/dL   LDL Cholesterol 914 (*) 0 - 99 mg/dL  POCT CBC      Result Value Range   WBC 5.2  4.6 - 10.2 K/uL   Lymph, poc 1.7  0.6 - 3.4   POC LYMPH PERCENT 33.4  10 - 50 %L   MID (cbc)    0 - 0.9   POC MID %    0 - 12 %M   POC Granulocyte 3.2  2 - 6.9   Granulocyte percent 60.8  37 - 80 %G   RBC 4.3  4.04 - 5.48 M/uL   Hemoglobin 12.7  12.2 - 16.2 g/dL   HCT, POC 78.2  95.6 - 47.9 %   MCV 89.1  80 - 97 fL   MCH, POC 29.5  27 - 31.2 pg   MCHC 33.1  31.8 - 35.4 g/dL   RDW, POC 21.3     Platelet Count, POC 306.0  142 - 424 K/uL   MPV 6.9  0 - 99.8 fL    Reviewed labs and meds.  Supportive therapy. Balanced lifestyle. Heat to the back with massages. Back care discussed.  Return in about 2 months (around 03/20/2013) for Recheck medical problems.  Zola Runion P. [place] Charon, M.D.

## 2013-02-11 ENCOUNTER — Other Ambulatory Visit: Payer: Self-pay | Admitting: Family Medicine

## 2013-03-22 ENCOUNTER — Other Ambulatory Visit: Payer: Self-pay | Admitting: Family Medicine

## 2013-04-05 ENCOUNTER — Encounter: Payer: Self-pay | Admitting: Family Medicine

## 2013-04-05 ENCOUNTER — Ambulatory Visit (INDEPENDENT_AMBULATORY_CARE_PROVIDER_SITE_OTHER): Payer: Medicare Other | Admitting: Family Medicine

## 2013-04-05 VITALS — BP 119/74 | HR 66 | Temp 97.6°F | Ht 63.0 in | Wt 142.4 lb

## 2013-04-05 DIAGNOSIS — Z23 Encounter for immunization: Secondary | ICD-10-CM | POA: Insufficient documentation

## 2013-04-05 DIAGNOSIS — F329 Major depressive disorder, single episode, unspecified: Secondary | ICD-10-CM

## 2013-04-05 DIAGNOSIS — Z860100 Personal history of colon polyps, unspecified: Secondary | ICD-10-CM

## 2013-04-05 DIAGNOSIS — M810 Age-related osteoporosis without current pathological fracture: Secondary | ICD-10-CM

## 2013-04-05 DIAGNOSIS — F3289 Other specified depressive episodes: Secondary | ICD-10-CM

## 2013-04-05 NOTE — Progress Notes (Signed)
Patient ID: Kathryn Stein, female   DOB: 1946/04/11, 67 y.o.   MRN: [id number] SUBJECTIVE: CC: Chief Complaint  Patient presents with  . Follow-up    2 MONTH FOLLOW UP CHRONIC PROBLEMS     HPI:  Here for follow up. Depression is doing well. No suicidal ideation or delusions. Family stresses are calm at present. Only 1 family member is at home. Husband is cooperating.  Past Medical History  Diagnosis Date  . Depression   . Anorexia   . Dyspepsia   . Anemia   . Anxiety   . Arthritis   . Hypertension   . GERD (gastroesophageal reflux disease)   . Osteoporosis   . Colon polyps   . Gastritis     found on EGD  . Marital relationship problem   . Vertigo due to previous cerebellar infarction   . Postmenopausal   . Vertigo    Past Surgical History  Procedure Laterality Date  . Cesarean section    . Mouth surgery      bone extraction  . Colonoscopy    . Breast surgery Left     biopsy benign  . Tubal ligation     History   Social History  . Marital Status: Married    Spouse Name: N/A    Number of Children: 5  . Years of Education: N/A   Occupational History  . disability    Social History Main Topics  . Smoking status: Never Smoker   . Smokeless tobacco: Never Used  . Alcohol Use: No  . Drug Use: No  . Sexual Activity: Not on file   Other Topics Concern  . Not on file   Social History Narrative  . No narrative on file   Family History  Problem Relation Age of Onset  . Colon cancer Mother   . Diabetes Mother     3 sisters  . Prostate cancer Brother   . Stomach cancer      niece  . Kidney cancer      niece  . Heart disease Sister   . Liver disease Brother   . Stroke Father   . Diabetes Sister   . Heart disease Sister    Current Outpatient Prescriptions on File Prior to Visit  Medication Sig Dispense Refill  . aspirin EC 81 MG tablet Take 81 mg by mouth daily as needed. For pain      . calcium citrate-vitamin D (CITRACAL+D) 315-200 MG-UNIT per  tablet Take 1 tablet by mouth 2 (two) times daily.        . cholecalciferol (VITAMIN D) 1000 UNITS tablet Take 1,000 Units by mouth 2 (two) times daily.      . citalopram (CELEXA) 40 MG tablet TAKE ONE TABLET BY MOUTH ONE TIME DAILY  30 tablet  0  . cyproheptadine (PERIACTIN) 4 MG tablet Take 1 tablet (4 mg total) by mouth 3 (three) times daily as needed.  90 tablet  1   No current facility-administered medications on file prior to visit.   Allergies  Allergen Reactions  . Codeine Nausea And Vomiting  . Darvocet [Propoxyphene-Acetaminophen]    Immunization History  Administered Date(s) Administered  . DTaP 07/12/2010  . Influenza,inj,Quad PF,36+ Mos 04/05/2013  . Influenza-Unspecified 02/23/2012  . Pneumococcal Polysaccharide-23 12/23/2011   Prior to Admission medications   Medication Sig Start Date End Date Taking? Authorizing Provider  aspirin EC 81 MG tablet Take 81 mg by mouth daily as needed. For pain   Yes Historical  Provider, MD  calcium citrate-vitamin D (CITRACAL+D) 315-200 MG-UNIT per tablet Take 1 tablet by mouth 2 (two) times daily.     Yes Historical Provider, MD  cholecalciferol (VITAMIN D) 1000 UNITS tablet Take 1,000 Units by mouth 2 (two) times daily.   Yes Historical Provider, MD  citalopram (CELEXA) 40 MG tablet TAKE ONE TABLET BY MOUTH ONE TIME DAILY 03/22/13  Yes Ileana Ladd, MD  cyproheptadine (PERIACTIN) 4 MG tablet Take 1 tablet (4 mg total) by mouth 3 (three) times daily as needed. 11/30/12  Yes Ileana Ladd, MD     ROS: As above in the HPI. All other systems are stable or negative.  OBJECTIVE: APPEARANCE:  Patient in no acute distress.The patient appeared well nourished and normally developed. Acyanotic. Waist: VITAL SIGNS:BP 119/74  Pulse 66  Temp(Src) 97.6 F (36.4 C) (Oral)  Ht 5\' 3"  (1.6 m)  Wt 142 lb 6.4 oz (64.592 kg)  BMI 25.23 kg/m2  AAF  SKIN: warm and  Dry without overt rashes, tattoos and scars  HEAD and Neck: without  JVD, Head and scalp: normal Eyes:No scleral icterus. Fundi normal, eye movements normal. Ears: Auricle normal, canal normal, Tympanic membranes normal, insufflation normal. Nose: normal Throat: normal Neck & thyroid: normal  CHEST & LUNGS: Chest wall: normal Lungs: Clear  CVS: Reveals the PMI to be normally located. Regular rhythm, First and Second Heart sounds are normal,  absence of murmurs, rubs or gallops. Peripheral vasculature: Radial pulses: normal Dorsal pedis pulses: normal Posterior pulses: normal  ABDOMEN:  Appearance: normal Benign, no organomegaly, no masses, no Abdominal Aortic enlargement. No Guarding , no rebound. No Bruits. Bowel sounds: normal  RECTAL: N/A GU: N/A  EXTREMETIES: nonedematous.  MUSCULOSKELETAL:  Spine: normal Joints: intact  NEUROLOGIC: oriented to time,place and person; nonfocal. Strength is normal Sensory is normal Reflexes are normal Cranial Nerves are normal.  ASSESSMENT:  Depressive disorder, not elsewhere classified  Osteoporosis, unspecified  Personal history of colonic polyps  Need for prophylactic vaccination and inoculation against influenza  Patient is stable and doing well. No stressors at present and patient coping well.  PLAN: No changes in medications. Supportive therapy.  No orders of the defined types were placed in this encounter.   No orders of the defined types were placed in this encounter.   There are no discontinued medications. Return in about 4 months (around 08/03/2013) for Recheck medical problems.  Dayannara Pascal P. [place] Charon, M.D.

## 2013-05-21 ENCOUNTER — Other Ambulatory Visit: Payer: Self-pay | Admitting: Family Medicine

## 2013-05-24 NOTE — Telephone Encounter (Signed)
Last seen 04/05/13  [facility]

## 2013-05-24 NOTE — Telephone Encounter (Signed)
Call patient : Prescription refilled & sent to pharmacy in [place]. 

## 2013-05-26 ENCOUNTER — Ambulatory Visit (INDEPENDENT_AMBULATORY_CARE_PROVIDER_SITE_OTHER): Payer: Medicare HMO | Admitting: Family Medicine

## 2013-05-26 ENCOUNTER — Encounter: Payer: Self-pay | Admitting: Family Medicine

## 2013-05-26 ENCOUNTER — Ambulatory Visit (HOSPITAL_COMMUNITY)
Admission: RE | Admit: 2013-05-26 | Discharge: 2013-05-26 | Disposition: A | Discharge status: Home / Self Care | Payer: Medicare HMO | Source: Ambulatory Visit | Attending: Family Medicine | Admitting: Family Medicine

## 2013-05-26 VITALS — BP 151/78 | HR 102 | Temp 98.0°F | Ht 64.0 in | Wt 143.4 lb

## 2013-05-26 DIAGNOSIS — R519 Headache, unspecified: Secondary | ICD-10-CM | POA: Insufficient documentation

## 2013-05-26 DIAGNOSIS — J020 Streptococcal pharyngitis: Secondary | ICD-10-CM

## 2013-05-26 DIAGNOSIS — R51 Headache: Secondary | ICD-10-CM | POA: Insufficient documentation

## 2013-05-26 DIAGNOSIS — J029 Acute pharyngitis, unspecified: Secondary | ICD-10-CM | POA: Insufficient documentation

## 2013-05-26 LAB — POCT RAPID STREP A (OFFICE): Rapid Strep A Screen: POSITIVE — AB

## 2013-05-26 MED ORDER — KETOROLAC TROMETHAMINE 60 MG/2ML IM SOLN
60.0000 mg | Freq: Once | INTRAMUSCULAR | Status: AC
  Administered 2013-05-26: 60 mg via INTRAMUSCULAR

## 2013-05-26 MED ORDER — AMOXICILLIN 500 MG PO CAPS: 500.0000 mg | ORAL_CAPSULE | Freq: Three times a day (TID) | ORAL | Status: DC

## 2013-05-26 NOTE — Patient Instructions (Addendum)
Strep Throat Strep throat is an infection of the throat caused by a bacteria named Streptococcus pyogenes. Your caregiver may call the infection streptococcal "tonsillitis" or "pharyngitis" depending on whether there are signs of inflammation in the tonsils or back of the throat. Strep throat is most common in children aged 68 15 years during the cold months of the year, but it can occur in people of any age during any season. This infection is spread from person to person (contagious) through coughing, sneezing, or other close contact. SYMPTOMS   Fever or chills.  Painful, swollen, red tonsils or throat.  Pain or difficulty when swallowing. Ketorolac injection What is this medicine? KETOROLAC (kee toe ROLE ak) is a non-steroidal anti-inflammatory drug (NSAID). It is used to treat moderate to severe pain for up to 5 days. It is commonly used after surgery. This medicine should not be used for more than 5 days. This medicine may be used for other purposes; ask your health care provider or pharmacist if you have questions. COMMON BRAND NAME(S): Toradol What should I tell my health care provider before I take this medicine? They need to know if you have any of these conditions: -asthma, especially aspirin-sensitive asthma -bleeding problems -kidney disease -stomach bleed, ulcer, or other problem -taking aspirin, other NSAID, or probenecid -an unusual or allergic reaction to ketorolac, tromethamine, aspirin, other NSAIDs, other medicines, foods, dyes or preservatives -pregnant or trying to get pregnant -breast-feeding How should I use this medicine? This medicine is for injection into a muscle or into a vein. It is given by a health care professional in a hospital or clinic setting. Talk to your pediatrician regarding the use of this medicine in children. While this drug may be prescribed for children as young as 80 years old for selected conditions, precautions do apply. Patients over 20 years  old may have a stronger reaction and need a smaller dose. Overdosage: If you think you have taken too much of this medicine contact a poison control center or emergency room at once. NOTE: This medicine is only for you. Do not share this medicine with others. What if I miss a dose? This does not apply. What may interact with this medicine? Do not take this medicine with any of the following medications: -aspirin and aspirin-like medicines -cidofovir -methotrexate -NSAIDs, medicines for pain and inflammation, like ibuprofen or naproxen -pentoxifylline -probenecid This medicine may also interact with the following medications: -alcohol -alendronate -alprazolam -carbamazepine -diuretics -flavocoxid -fluoxetine -ginkgo -lithium -medicines for blood pressure like enalapril -medicines that affect platelets like pentoxifylline -medicines that treat or prevent blood clots like heparin, warfarin -muscle relaxants -pemetrexed -phenytoin -thiothixene This list may not describe all possible interactions. Give your health care provider a list of all the medicines, herbs, non-prescription drugs, or dietary supplements you use. Also tell them if you smoke, drink alcohol, or use illegal drugs. Some items may interact with your medicine. What should I watch for while using this medicine? Tell your doctor or healthcare professional if your symptoms do not start to get better or if they get worse. This medicine does not prevent heart attack or stroke. In fact, this medicine may increase the chance of a heart attack or stroke. The chance may increase with longer use of this medicine and in people who have heart disease. If you take aspirin to prevent heart attack or stroke, talk with your doctor or health care professional. Do not take medicines such as ibuprofen and naproxen with this medicine. Side effects  such as stomach upset, nausea, or ulcers may be more likely to occur. Many medicines available  without a prescription should not be taken with this medicine. This medicine can cause ulcers and bleeding in the stomach and intestines at any time during treatment. Do not smoke cigarettes or drink alcohol. These increase irritation to your stomach and can make it more susceptible to damage from this medicine. Ulcers and bleeding can happen without warning symptoms and can cause death. This medicine can cause you to bleed more easily. Try to avoid damage to your teeth and gums when you brush or floss your teeth. What side effects may I notice from receiving this medicine? Side effects that you should report to your doctor or health care professional as soon as possible: -allergic reactions like skin rash, itching or hives, swelling of the face, lips, or tongue -breathing problems -high blood pressure -nausea, vomiting -redness, blistering, peeling or loosening of the skin, including inside the mouth -severe stomach pain -signs and symptoms of bleeding such as bloody or black, tarry stools; red or dark-brown urine; spitting up blood or brown material that looks like coffee grounds; red spots on the skin; unusual bruising or bleeding from the eye, gums, or nose -signs and symptoms of a blood clot changes in vision; chest pain; severe, sudden headache; trouble speaking; sudden numbness or weakness of the face, arm, or leg -trouble passing urine or change in the amount of urine -unexplained weight gain or swelling -unusually weak or tired -yellowing of eyes or skin  Side effects that usually do not require medical attention (report to your doctor or health care professional if they continue or are bothersome): -diarrhea -dizziness -headache -heartburn This list may not describe all possible side effects. Call your doctor for medical advice about side effects. You may report side effects to FDA at 1-800-FDA-1088. Where should I keep my medicine? This drug is given in a hospital or clinic and  will not be stored at home. NOTE: This sheet is a summary. It may not cover all possible information. If you have questions about this medicine, talk to your doctor, pharmacist, or health care provider.  2014, [employer]. (2012-09-14 16:34:56)  White or yellow spots on the tonsils or throat.  Swollen, tender lymph nodes or "glands" of the neck or under the jaw.  Red rash all over the body (rare). DIAGNOSIS  Many different infections can cause the same symptoms. A test must be done to confirm the diagnosis so the right treatment can be given. A "rapid strep test" can help your caregiver make the diagnosis in a few minutes. If this test is not available, a light swab of the infected area can be used for a throat culture test. If a throat culture test is done, results are usually available in a day or two. TREATMENT  Strep throat is treated with antibiotic medicine. HOME CARE INSTRUCTIONS   Gargle with 1 tsp of salt in 1 cup of warm water, 3 4 times per day or as needed for comfort.  Family members who also have a sore throat or fever should be tested for strep throat and treated with antibiotics if they have the strep infection.  Make sure everyone in your household washes their hands well.  Do not share food, drinking cups, or personal items that could cause the infection to spread to others.  You may need to eat a soft food diet until your sore throat gets better.  Drink enough water and fluids  to keep your urine clear or pale yellow. This will help prevent dehydration.  Get plenty of rest.  Stay home from school, daycare, or work until you have been on antibiotics for 24 hours.  Only take over-the-counter or prescription medicines for pain, discomfort, or fever as directed by your caregiver.  If antibiotics are prescribed, take them as directed. Finish them even if you start to feel better. SEEK MEDICAL CARE IF:   The glands in your neck continue to enlarge.  You  develop a rash, cough, or earache.  You cough up green, yellow-brown, or bloody sputum.  You have pain or discomfort not controlled by medicines.  Your problems seem to be getting worse rather than better. SEEK IMMEDIATE MEDICAL CARE IF:   You develop any new symptoms such as vomiting, severe headache, stiff or painful neck, chest pain, shortness of breath, or trouble swallowing.  You develop severe throat pain, drooling, or changes in your voice.  You develop swelling of the neck, or the skin on the neck becomes red and tender.  You have a fever.  You develop signs of dehydration, such as fatigue, dry mouth, and decreased urination.  You become increasingly sleepy, or you cannot wake up completely. Document Released: 04/25/2000 Document Revised: 04/14/2012 Document Reviewed: 06/27/2010 [facility] Patient Information 2014 [place], [place].   Transient Ischemic Attack A transient ischemic attack (TIA) is a "warning stroke" that causes stroke-like symptoms. Unlike a stroke, a TIA does not cause permanent damage to the brain. The symptoms of a TIA can happen very fast and do not last long. It is important to know the symptoms of a TIA and what to do. This can help prevent a major stroke or death. CAUSES   A TIA is caused by a temporary blockage in an artery in the brain or neck (carotid artery). The blockage does not allow the brain to get the blood supply it needs and can cause different symptoms. The blockage can be caused by either:  A blood clot.  Fatty buildup (plaque) in a neck or brain artery. RISK FACTORS  High blood pressure (hypertension).  High cholesterol.  Diabetes mellitus.  Heart disease.  The build up of plaque in the blood vessels (peripheral artery disease or atherosclerosis).  The build up of plaque in the blood vessels providing blood and oxygen to the brain (carotid artery stenosis).  An abnormal heart rhythm (atrial  fibrillation).  Obesity.  Smoking.  Taking oral contraceptives (especially in combination with smoking).  Physical inactivity.  A diet high in fats, salt (sodium), and calories.  Alcohol use.  Use of illegal drugs (especially cocaine and methamphetamine).  Being female.  Being African American.  Being over the age of 53.  Family history of stroke.  Previous history of blood clots, stroke, TIA, or heart attack.  Sickle cell disease. SYMPTOMS  TIA symptoms are the same as a stroke but are temporary. These symptoms usually develop suddenly, or may be newly present upon awakening from sleep:  Sudden weakness or numbness of the face, arm, or leg, especially on one side of the body.  Sudden trouble walking or difficulty moving arms or legs.  Sudden confusion.  Sudden personality changes.  Trouble speaking (aphasia) or understanding.  Difficulty swallowing.  Sudden trouble seeing in one or both eyes.  Double vision.  Dizziness.  Loss of balance or coordination.  Sudden severe headache with no known cause.  Trouble reading or writing.  Loss of bowel or bladder control.  Loss of  consciousness. DIAGNOSIS  Your caregiver may be able to determine the presence or absence of a TIA based on your symptoms, history, and physical exam. Computed tomography (CT scan) of the brain is usually performed to help identify a TIA. Other tests may be done to diagnose a TIA. These tests may include:  Electrocardiography.  Continuous heart monitoring.  Echocardiography.  Carotid ultrasonography.  Magnetic resonance imaging (MRI).  A scan of the brain circulation.  Blood tests. PREVENTION  The risk of a TIA can be decreased by appropriately treating high blood pressure, high cholesterol, diabetes, heart disease, and obesity and by quitting smoking, limiting alcohol, and staying physically active. TREATMENT  Time is of the essence. Since the symptoms of TIA are the same as a  stroke, it is important to seek treatment within 3 4 hours of the start of symptoms because you may receive a medicine to dissolve the clot (thrombolytic) that cannot be given after that time. Treatment options vary. Treatment options may include rest, oxygen, intravenous (IV) fluids, and medicines to thin the blood (anticoagulants). Medicines and diet may be used to address diabetes, high blood pressure, and other risk factors. Measures will be taken to prevent short-term and long-term complications, including infection from breathing foreign material into the lungs (aspiration pneumonia), blood clots in the legs, and falls. Treatment options include procedures to either remove plaque in the carotid arteries or dilate carotid arteries that have narrowed due to plaque. Those procedures are:  Carotid endarterectomy.  Carotid angioplasty and stenting. HOME CARE INSTRUCTIONS   Take all medicines prescribed by your caregiver. Follow the directions carefully. Medicines may be used to control risk factors for a stroke. Be sure you understand all your medicine instructions.  You may be told to take aspirin or the anticoagulant warfarin. Warfarin needs to be taken exactly as instructed.  Taking too much or too little warfarin is dangerous. Too much warfarin increases the risk of bleeding. Too little warfarin continues to allow the risk for blood clots. While taking warfarin, you will need to have regular blood tests to measure your blood clotting time. A PT blood test measures how long it takes for blood to clot. Your PT is used to calculate another value called an INR. Your PT and INR help your caregiver to adjust your dose of warfarin. The dose can change for many reasons. It is critically important that you take warfarin exactly as prescribed.  Many foods, especially foods high in vitamin K can interfere with warfarin and affect the PT and INR. Foods high in vitamin K include spinach, kale, broccoli,  cabbage, collard and turnip greens, brussels sprouts, peas, cauliflower, seaweed, and parsley as well as beef and pork liver, green tea, and soybean oil. You should eat a consistent amount of foods high in vitamin K. Avoid major changes in your diet, or notify your caregiver before changing your diet. Arrange a visit with a dietitian to answer your questions.  Many medicines can interfere with warfarin and affect the PT and INR. You must tell your caregiver about any and all medicines you take, this includes all vitamins and supplements. Be especially cautious with aspirin and anti-inflammatory medicines. Do not take or discontinue any prescribed or over-the-counter medicine except on the advice of your caregiver or pharmacist.  Warfarin can have side effects, such as excessive bruising or bleeding. You will need to hold pressure over cuts for longer than usual. Your caregiver or pharmacist will discuss other potential side effects.  Avoid sports  or activities that may cause injury or bleeding.  Be mindful when shaving, flossing your teeth, or handling sharp objects.  Alcohol can change the body's ability to handle warfarin. It is best to avoid alcoholic drinks or consume only very small amounts while taking warfarin. Notify your caregiver if you change your alcohol intake.  Notify your dentist or other caregivers before procedures.  Eat a diet that includes 5 or more servings of fruits and vegetables each day. This may reduce the risk of stroke. Certain diets may be prescribed to address high blood pressure, high cholesterol, diabetes, or obesity.  A low-sodium, low-saturated fat, low-trans fat, low-cholesterol diet is recommended to manage high blood pressure.  A low-saturated fat, low-trans fat, low-cholesterol, and high-fiber diet may control cholesterol levels.  A controlled-carbohydrate, controlled-sugar diet is recommended to manage diabetes.  A reduced-calorie, low-sodium, low-saturated  fat, low-trans fat, low-cholesterol diet is recommended to manage obesity.  Maintain a healthy weight.  Stay physically active. It is recommended that you get at least 30 minutes of activity on most or all days.  Do not smoke.  Limit alcohol use even if you are not taking warfarin. Moderate alcohol use is considered to be:  No more than 2 drinks each day for men.  No more than 1 drink each day for nonpregnant women.  Stop drug abuse.  Home safety. A safe home environment is important to reduce the risk of falls. Your caregiver may arrange for specialists to evaluate your home. Having grab bars in the bedroom and bathroom is often important. Your caregiver may arrange for equipment to be used at home, such as raised toilets and a seat for the shower.  Follow all instructions for follow-up with your caregiver. This is very important. This includes any referrals and lab tests. Proper follow up can prevent a stroke or another TIA from occurring. SEEK MEDICAL CARE IF:  You have personality changes.  You have difficulty swallowing.  You are seeing double.  You have dizziness.  You have a fever.  You have skin breakdown. SEEK IMMEDIATE MEDICAL CARE IF:  Any of these symptoms may represent a serious problem that is an emergency. Do not wait to see if the symptoms will go away. Get medical help right away. Call your local emergency services (911 in [place] Do not drive yourself to the hospital.  You have sudden weakness or numbness of the face, arm, or leg, especially on one side of the body.  You have sudden trouble walking or difficulty moving arms or legs.  You have sudden confusion.  You have trouble speaking (aphasia) or understanding.  You have sudden trouble seeing in one or both eyes.  You have a loss of balance or coordination.  You have a sudden, severe headache with no known cause.  You have new chest pain or an irregular heartbeat.  You have a partial or total  loss of consciousness. MAKE SURE YOU:   Understand these instructions.  Will watch your condition.  Will get help right away if you are not doing well or get worse. Document Released: 02/05/2005 Document Revised: 04/14/2012 Document Reviewed: 06/21/2009 [facility] Patient Information 2014 [place].

## 2013-05-26 NOTE — Progress Notes (Signed)
Patient ID: Kathryn Stein, female   DOB: [birth year], 68 y.o.   MRN: [id number] SUBJECTIVE: CC: Chief Complaint  Patient presents with  . Acute Visit    SORE THROAT AND PAIN IN BACK OF HEAD N NO NAUSES NO VOMITING ,NO BLURRED VISON .     HPI: Sore Throat Patient complains of sore throat. Associated symptoms include chills, headache, nasal blockage, post nasal drip, sinus and nasal congestion and sore throat. Onset of symptoms was 2 days ago, and have been gradually worsening since that time. She is drinking plenty of fluids. She has not had recent close exposure to someone with proven streptococcal pharyngitis.  Terrible headache at the back of the head. Cannot stand it. Fingers tingled last week. In 1999 she had an episode of terrible headache and face drawn to the right that resolved and was told she had a " mini stroke" in the back of the head.    Past Medical History  Diagnosis Date  . Depression   . Anorexia   . Dyspepsia   . Anemia   . Anxiety   . Arthritis   . Hypertension   . GERD (gastroesophageal reflux disease)   . Osteoporosis   . Colon polyps   . Gastritis     found on EGD  . Marital relationship problem   . Vertigo due to previous cerebellar infarction   . Postmenopausal   . Vertigo    Past Surgical History  Procedure Laterality Date  . Cesarean section    . Mouth surgery      bone extraction  . Colonoscopy    . Breast surgery Left     biopsy benign  . Tubal ligation     History   Social History  . Marital Status: Married    Spouse Name: N/A    Number of Children: 5  . Years of Education: N/A   Occupational History  . disability    Social History Main Topics  . Smoking status: Never Smoker   . Smokeless tobacco: Never Used  . Alcohol Use: No  . Drug Use: No  . Sexual Activity: Not on file   Other Topics Concern  . Not on file   Social History Narrative  . No narrative on file   Family History  Problem Relation Age of Onset  . Colon  cancer Mother   . Diabetes Mother     3 sisters  . Prostate cancer Brother   . Stomach cancer      niece  . Kidney cancer      niece  . Heart disease Sister   . Liver disease Brother   . Stroke Father   . Diabetes Sister   . Heart disease Sister    Current Outpatient Prescriptions on File Prior to Visit  Medication Sig Dispense Refill  . aspirin EC 81 MG tablet Take 81 mg by mouth daily as needed. For pain      . calcium citrate-vitamin D (CITRACAL+D) 315-200 MG-UNIT per tablet Take 1 tablet by mouth 2 (two) times daily.        . cholecalciferol (VITAMIN D) 1000 UNITS tablet Take 1,000 Units by mouth 2 (two) times daily.      . citalopram (CELEXA) 40 MG tablet TAKE ONE TABLET BY MOUTH ONE TIME DAILY  30 tablet  0  . cyproheptadine (PERIACTIN) 4 MG tablet TAKE 1 TABLET (4 MG TOTAL) BY MOUTH 3 (THREE) TIMES DAILY AS NEEDED.  90 tablet  0  No current facility-administered medications on file prior to visit.   Allergies  Allergen Reactions  . Codeine Nausea And Vomiting  . Darvocet [Propoxyphene N-Acetaminophen]    Immunization History  Administered Date(s) Administered  . DTaP 07/12/2010  . Influenza,inj,Quad PF,36+ Mos 04/05/2013  . Influenza-Unspecified 02/23/2012  . Pneumococcal Polysaccharide-23 12/23/2011   Prior to Admission medications   Medication Sig Start Date End Date Taking? Authorizing Provider  aspirin EC 81 MG tablet Take 81 mg by mouth daily as needed. For pain   Yes Historical Provider, MD  calcium citrate-vitamin D (CITRACAL+D) 315-200 MG-UNIT per tablet Take 1 tablet by mouth 2 (two) times daily.     Yes Historical Provider, MD  cholecalciferol (VITAMIN D) 1000 UNITS tablet Take 1,000 Units by mouth 2 (two) times daily.   Yes Historical Provider, MD  citalopram (CELEXA) 40 MG tablet TAKE ONE TABLET BY MOUTH ONE TIME DAILY 03/22/13  Yes Vernie Shanks, MD  cyproheptadine (PERIACTIN) 4 MG tablet TAKE 1 TABLET (4 MG TOTAL) BY MOUTH 3 (THREE) TIMES DAILY AS  NEEDED. 05/21/13  Yes Vernie Shanks, MD     ROS: As above in the HPI. All other systems are stable or negative.  OBJECTIVE: APPEARANCE:  Patient in no acute distress.The patient appeared well nourished and normally developed. Acyanotic. Waist: VITAL SIGNS:BP 151/78  Pulse 102  Temp(Src) 98 F (36.7 C) (Oral)  Ht 5\' 4"  (1.626 m)  Wt 143 lb 6.4 oz (65.046 kg)  BMI 24.60 kg/m2  SpO2 97%  AAF holding the back of her head SKIN: warm and  Dry without overt rashes, tattoos and scars  HEAD and Neck: without JVD, Head and scalp: normal Eyes:No scleral icterus. Fundi normal, eye movements normal. Ears: Auricle normal, canal normal, Tympanic membranes normal, insufflation normal. Nose: normal Throat: very red with a small exudate at the left tonsillar area. Neck & thyroid: normal Shotty tender lymph nodes.   CHEST & LUNGS: Chest wall: normal Lungs: Clear  CVS: Reveals the PMI to be normally located. Regular rhythm, First and Second Heart sounds are normal,  absence of murmurs, rubs or gallops. Peripheral vasculature: Radial pulses: normal Dorsal pedis pulses: normal Posterior pulses: normal  ABDOMEN:  Appearance: normal Benign, no organomegaly, no masses, no Abdominal Aortic enlargement. No Guarding , no rebound. No Bruits. Bowel sounds: normal  RECTAL: N/A GU: N/A  EXTREMETIES: nonedematous.  MUSCULOSKELETAL:  Spine: normal Joints: intact  NEUROLOGIC: oriented to time,place and person; nonfocal.speech clear. Strength is normal Sensory is normal Reflexes are normal Cranial Nerves are normal.  ASSESSMENT: Sore throat - Plan: POCT rapid strep A  Headache(784.0) - Plan: CT Head Wo Contrast, ketorolac (TORADOL) injection 60 mg  Streptococcal sore throat - Plan: amoxicillin (AMOXIL) 500 MG capsule H/o TIA and cerebellar incident in the past.  PLAN:  Orders Placed This Encounter  Procedures  . CT Head Wo Contrast    Standing Status: Future     Number  of Occurrences:      Standing Expiration Date: 08/25/2014    Order Specific Question:  Reason for Exam (SYMPTOM  OR DIAGNOSIS REQUIRED)    Answer:  occipital headache severe. h/o cerebellar infarct years ago r/o CVA    Order Specific Question:  Preferred imaging location?    Answer:  [place] rapid strep A   Results for orders placed in visit on 05/26/13  POCT RAPID STREP A (OFFICE)      Result Value Range   Rapid Strep A Screen  Positive (*) Negative    Meds ordered this encounter  Medications  . amoxicillin (AMOXIL) 500 MG capsule    Sig: Take 1 capsule (500 mg total) by mouth 3 (three) times daily.    Dispense:  30 capsule    Refill:  0  . ketorolac (TORADOL) injection 60 mg    Sig:    Hand outs in the AVS on strep throat and TIA.  There are no discontinued medications. Return pending the CT scan.Dub Mikes P. Jacelyn Grip, M.D.

## 2013-05-26 NOTE — Progress Notes (Signed)
TOLERATED TORADOL INJECTION RT GLUTEAL WITHOUT DIFFICULTY

## 2013-06-21 ENCOUNTER — Other Ambulatory Visit: Payer: Self-pay | Admitting: Family Medicine

## 2013-07-07 ENCOUNTER — Ambulatory Visit: Payer: Medicare HMO | Admitting: Family Medicine

## 2013-07-12 ENCOUNTER — Ambulatory Visit (INDEPENDENT_AMBULATORY_CARE_PROVIDER_SITE_OTHER): Payer: Medicare HMO | Admitting: Pharmacist Clinician (PhC)/ Clinical Pharmacy Specialist

## 2013-07-12 ENCOUNTER — Ambulatory Visit (INDEPENDENT_AMBULATORY_CARE_PROVIDER_SITE_OTHER): Payer: Medicare HMO

## 2013-07-12 ENCOUNTER — Other Ambulatory Visit: Payer: Self-pay | Admitting: Family Medicine

## 2013-07-12 DIAGNOSIS — E559 Vitamin D deficiency, unspecified: Secondary | ICD-10-CM

## 2013-07-12 DIAGNOSIS — R5381 Other malaise: Secondary | ICD-10-CM

## 2013-07-12 DIAGNOSIS — M810 Age-related osteoporosis without current pathological fracture: Secondary | ICD-10-CM

## 2013-07-12 DIAGNOSIS — R5383 Other fatigue: Secondary | ICD-10-CM

## 2013-07-12 LAB — HM DEXA SCAN

## 2013-07-12 NOTE — Progress Notes (Signed)
Patient walk into clinic today regarding re-authorization of her forteo.  I noticed that she was due for her dexascan and labs as well.  Her dexascan results reveals marked improvement in the spine and hip T-Scores since starting Forteo.  Her lasbs are pending.  I reviewed progress with patient and labs we would need for follow up.  She is tolerating Forteo with no side effects or problems with administration.  I will contact her insurance for approval to continue Forteo.

## 2013-07-13 LAB — BMP8+EGFR
BUN / CREAT RATIO: 22 (ref 11–26)
BUN: 15 mg/dL (ref 8–27)
CHLORIDE: 102 mmol/L (ref 97–108)
CO2: 26 mmol/L (ref 18–29)
CREATININE: 0.68 mg/dL (ref 0.57–1.00)
Calcium: 9.9 mg/dL (ref 8.7–10.3)
GFR calc Af Amer: 105 mL/min/{1.73_m2} (ref >59)
GFR calc non Af Amer: 91 mL/min/{1.73_m2} (ref >59)
Glucose: 71 mg/dL (ref 65–99)
POTASSIUM: 4.8 mmol/L (ref 3.5–5.2)
SODIUM: 143 mmol/L (ref 134–144)

## 2013-07-13 LAB — ALKALINE PHOSPHATASE: Alkaline Phosphatase: 66 IU/L (ref 39–117)

## 2013-07-13 LAB — VITAMIN D 25 HYDROXY (VIT D DEFICIENCY, FRACTURES): Vit D, 25-Hydroxy: 29.2 ng/mL — ABNORMAL LOW (ref 30.0–100.0)

## 2013-07-13 LAB — PARATHYROID HORMONE, INTACT (NO CA): PTH: 17 pg/mL (ref 15–65)

## 2013-07-29 ENCOUNTER — Ambulatory Visit (INDEPENDENT_AMBULATORY_CARE_PROVIDER_SITE_OTHER): Payer: Medicare HMO | Admitting: Family Medicine

## 2013-07-29 ENCOUNTER — Encounter: Payer: Self-pay | Admitting: Family Medicine

## 2013-07-29 VITALS — BP 142/84 | HR 71 | Temp 97.2°F | Ht 64.0 in | Wt 146.8 lb

## 2013-07-29 DIAGNOSIS — F329 Major depressive disorder, single episode, unspecified: Secondary | ICD-10-CM

## 2013-07-29 DIAGNOSIS — Z860100 Personal history of colon polyps, unspecified: Secondary | ICD-10-CM

## 2013-07-29 DIAGNOSIS — M810 Age-related osteoporosis without current pathological fracture: Secondary | ICD-10-CM

## 2013-07-29 DIAGNOSIS — E785 Hyperlipidemia, unspecified: Secondary | ICD-10-CM | POA: Insufficient documentation

## 2013-07-29 DIAGNOSIS — F3289 Other specified depressive episodes: Secondary | ICD-10-CM

## 2013-07-29 NOTE — Progress Notes (Signed)
Patient ID: SEE BEHARRY, female   DOB: 12-Jan-1946, 68 y.o.   MRN: [id number] SUBJECTIVE: CC: Chief Complaint  Patient presents with  . Follow-up    HEAD AND NECK PAIN BETTER. NO  COMPLAINTS    HPI: Worried about brother who has throat cancer.stress with grandchildren. Otherwise she has been well. Patient is here for follow up of hypertension/depression/: denies Headache;deniesChest Pain;denies weakness;denies Shortness of Breath or Orthopnea;denies Visual changes;denies palpitations;denies cough;denies pedal edema;denies symptoms of TIA or stroke; admits to Compliance with medications. denies Problems with medications. No suicidal ideation. No delusions.  Past Medical History  Diagnosis Date  . Depression   . Anorexia   . Dyspepsia   . Anemia   . Anxiety   . Arthritis   . Hypertension   . GERD (gastroesophageal reflux disease)   . Osteoporosis   . Colon polyps   . Gastritis     found on EGD  . Marital relationship problem   . Vertigo due to previous cerebellar infarction   . Postmenopausal   . Vertigo    Past Surgical History  Procedure Laterality Date  . Cesarean section    . Mouth surgery      bone extraction  . Colonoscopy    . Breast surgery Left     biopsy benign  . Tubal ligation     History   Social History  . Marital Status: Married    Spouse Name: N/A    Number of Children: 10  . Years of Education: N/A   Occupational History  . disability    Social History Main Topics  . Smoking status: Never Smoker   . Smokeless tobacco: Never Used  . Alcohol Use: No  . Drug Use: No  . Sexual Activity: Not on file   Other Topics Concern  . Not on file   Social History Narrative  . No narrative on file   Family History  Problem Relation Age of Onset  . Colon cancer Mother   . Diabetes Mother     3 sisters  . Prostate cancer Brother   . Stomach cancer      niece  . Kidney cancer      niece  . Heart disease Sister   . Liver disease Brother   .  Stroke Father   . Diabetes Sister   . Heart disease Sister    Current Outpatient Prescriptions on File Prior to Visit  Medication Sig Dispense Refill  . aspirin EC 81 MG tablet Take 81 mg by mouth daily as needed. For pain      . calcium citrate-vitamin D (CITRACAL+D) 315-200 MG-UNIT per tablet Take 1 tablet by mouth 2 (two) times daily.        . cholecalciferol (VITAMIN D) 1000 UNITS tablet Take 1,000 Units by mouth 2 (two) times daily.      . citalopram (CELEXA) 40 MG tablet TAKE ONE TABLET BY MOUTH ONE TIME DAILY  30 tablet  0  . cyproheptadine (PERIACTIN) 4 MG tablet TAKE 1 TABLET (4 MG TOTAL) BY MOUTH 3 (THREE) TIMES DAILY AS NEEDED.  90 tablet  0   No current facility-administered medications on file prior to visit.   Allergies  Allergen Reactions  . Codeine Nausea And Vomiting  . Darvocet [Propoxyphene N-Acetaminophen]    Immunization History  Administered Date(s) Administered  . DTaP 07/12/2010  . Influenza,inj,Quad PF,36+ Mos 04/05/2013  . Influenza-Unspecified 02/23/2012  . Pneumococcal Polysaccharide-23 12/23/2011   Prior to Admission medications   Medication  Sig Start Date End Date Taking? Authorizing Provider  aspirin EC 81 MG tablet Take 81 mg by mouth daily as needed. For pain   Yes Historical Provider, MD  calcium citrate-vitamin D (CITRACAL+D) 315-200 MG-UNIT per tablet Take 1 tablet by mouth 2 (two) times daily.     Yes Historical Provider, MD  cholecalciferol (VITAMIN D) 1000 UNITS tablet Take 1,000 Units by mouth 2 (two) times daily.   Yes Historical Provider, MD  citalopram (CELEXA) 40 MG tablet TAKE ONE TABLET BY MOUTH ONE TIME DAILY 03/22/13  Yes Vernie Shanks, MD  cyproheptadine (PERIACTIN) 4 MG tablet TAKE 1 TABLET (4 MG TOTAL) BY MOUTH 3 (THREE) TIMES DAILY AS NEEDED.   Yes Vernie Shanks, MD  amoxicillin (AMOXIL) 500 MG capsule Take 1 capsule (500 mg total) by mouth 3 (three) times daily. 05/26/13   Vernie Shanks, MD     ROS: As above in the HPI. All  other systems are stable or negative.  OBJECTIVE: APPEARANCE:  Patient in no acute distress.The patient appeared well nourished and normally developed. Acyanotic. Waist: VITAL SIGNS:BP 142/84  Pulse 71  Temp(Src) 97.2 F (36.2 C) (Oral)  Ht $R'5\' 4"'Zz$  (1.626 m)  Wt 146 lb 12.8 oz (66.588 kg)  BMI 25.19 kg/m2 AAF  SKIN: warm and  Dry without overt rashes, tattoos and scars  HEAD and Neck: without JVD, Head and scalp: normal Eyes:No scleral icterus. Fundi normal, eye movements normal. Ears: Auricle normal, canal normal, Tympanic membranes normal, insufflation normal. Nose: normal Throat: normal Neck & thyroid: normal  CHEST & LUNGS: Chest wall: normal Lungs: Clear  CVS: Reveals the PMI to be normally located. Regular rhythm, First and Second Heart sounds are normal,  absence of murmurs, rubs or gallops. Peripheral vasculature: Radial pulses: normal Dorsal pedis pulses: normal Posterior pulses: normal  ABDOMEN:  Appearance: normal Benign, no organomegaly, no masses, no Abdominal Aortic enlargement. No Guarding , no rebound. No Bruits. Bowel sounds: normal  RECTAL: N/A GU: N/A  EXTREMETIES: nonedematous.  MUSCULOSKELETAL:  Spine: normal Joints: intact  NEUROLOGIC: oriented to time,place and person; nonfocal. Strength is normal Sensory is normal Reflexes are normal Cranial Nerves are normal.  Psychiatric: intact.  Results for orders placed in visit on 07/12/13  [facility]      Result Value Ref Range   Glucose 71  65 - 99 mg/dL   BUN 15  8 - 27 mg/dL   Creatinine, Ser 0.68  0.57 - 1.00 mg/dL   GFR calc non Af Amer 91  >59 mL/min/1.73   GFR calc Af Amer 105  >59 mL/min/1.73   BUN/Creatinine Ratio 22  11 - 26   Sodium 143  134 - 144 mmol/L   Potassium 4.8  3.5 - 5.2 mmol/L   Chloride 102  97 - 108 mmol/L   CO2 26  18 - 29 mmol/L   Calcium 9.9  8.7 - 10.3 mg/dL  ALKALINE PHOSPHATASE      Result Value Ref Range   Alkaline Phosphatase 66  39 - 117 IU/L   VITAMIN D 25 HYDROXY      Result Value Ref Range   Vit D, 25-Hydroxy 29.2 (*) 30.0 - 100.0 ng/mL  PARATHYROID HORMONE, INTACT (NO CA)      Result Value Ref Range   PTH 17  15 - 65 pg/mL    ASSESSMENT: HLD (hyperlipidemia) - Plan: Hepatic function panel, Lipid panel  Depressive disorder, not elsewhere classified  Osteoporosis, unspecified  Personal history of colonic polyps  PLAN: Discussed coping with her  Stress and her faith.  counselled on stress management.  Orders Placed This Encounter  Procedures  . Hepatic function panel  . Lipid panel  continue present medications.  No orders of the defined types were placed in this encounter.   Medications Discontinued During This Encounter  Medication Reason  . amoxicillin (AMOXIL) 500 MG capsule Completed Course   Return in about 3 months (around 10/29/2013) for Recheck medical problems with Mae.  Jmichael Gille P. Jacelyn Grip, M.D.

## 2013-08-04 ENCOUNTER — Ambulatory Visit: Payer: Medicare Other | Admitting: Family Medicine

## 2013-08-10 ENCOUNTER — Telehealth: Payer: Self-pay | Admitting: Family Medicine

## 2013-08-10 ENCOUNTER — Other Ambulatory Visit: Payer: Self-pay | Admitting: Family Medicine

## 2013-08-10 MED ORDER — CITALOPRAM HYDROBROMIDE 40 MG PO TABS: ORAL_TABLET | ORAL | Status: DC

## 2013-08-10 MED ORDER — CYPROHEPTADINE HCL 4 MG PO TABS: ORAL_TABLET | ORAL | Status: DC

## 2013-08-10 NOTE — Telephone Encounter (Signed)
TAKEN CARE OF IN RESULT NOTES

## 2013-08-10 NOTE — Telephone Encounter (Signed)
Call patient : Prescription refilled & sent to pharmacy in [place]. 

## 2013-09-01 ENCOUNTER — Telehealth: Payer: Self-pay | Admitting: Pharmacist

## 2013-09-01 DIAGNOSIS — M810 Age-related osteoporosis without current pathological fracture: Secondary | ICD-10-CM

## 2013-09-01 NOTE — Telephone Encounter (Signed)
Patient took Forteo for osteoporosis for 2 years.  She had a DEXA in March which showed a great response to [employer].  T-Scores are all now in osteopenic range.  She has a self reported history of jaw deterioration which makes a bisphosphonate or Prolia not the best choice.  I recommend evista 60mg  daily and recheck BMD in 2 years. Tried to call patient to discuss but no answer - left message.

## 2013-09-14 MED ORDER — RALOXIFENE HCL 60 MG PO TABS: 60.0000 mg | ORAL_TABLET | Freq: Every day | ORAL | Status: DC

## 2013-10-12 ENCOUNTER — Encounter: Payer: Self-pay | Admitting: *Deleted

## 2013-11-04 ENCOUNTER — Encounter (INDEPENDENT_AMBULATORY_CARE_PROVIDER_SITE_OTHER): Payer: Self-pay

## 2013-11-04 ENCOUNTER — Encounter: Payer: Self-pay | Admitting: Family

## 2013-11-04 ENCOUNTER — Ambulatory Visit (INDEPENDENT_AMBULATORY_CARE_PROVIDER_SITE_OTHER): Payer: Medicare HMO | Admitting: Family

## 2013-11-04 ENCOUNTER — Ambulatory Visit: Payer: Medicare HMO | Admitting: General Practice

## 2013-11-04 VITALS — BP 144/85 | HR 64 | Temp 97.4°F | Ht 64.0 in | Wt 144.8 lb

## 2013-11-04 DIAGNOSIS — F3289 Other specified depressive episodes: Secondary | ICD-10-CM

## 2013-11-04 DIAGNOSIS — I10 Essential (primary) hypertension: Secondary | ICD-10-CM

## 2013-11-04 DIAGNOSIS — K5900 Constipation, unspecified: Secondary | ICD-10-CM

## 2013-11-04 DIAGNOSIS — E559 Vitamin D deficiency, unspecified: Secondary | ICD-10-CM

## 2013-11-04 DIAGNOSIS — M810 Age-related osteoporosis without current pathological fracture: Secondary | ICD-10-CM

## 2013-11-04 DIAGNOSIS — E785 Hyperlipidemia, unspecified: Secondary | ICD-10-CM

## 2013-11-04 DIAGNOSIS — F329 Major depressive disorder, single episode, unspecified: Secondary | ICD-10-CM

## 2013-11-04 MED ORDER — HYDROCHLOROTHIAZIDE 12.5 MG PO TABS: 12.5000 mg | ORAL_TABLET | Freq: Every day | ORAL | Status: DC

## 2013-11-04 NOTE — Patient Instructions (Signed)
Constipation  Constipation is when a person has fewer than three bowel movements a week, has difficulty having a bowel movement, or has stools that are dry, hard, or larger than normal. As people grow older, constipation is more common. If you try to fix constipation with medicines that make you have a bowel movement (laxatives), the problem may get worse. Long-term laxative use may cause the muscles of the colon to become weak. A low-fiber diet, not taking in enough fluids, and taking certain medicines may make constipation worse.   CAUSES   · Certain medicines, such as antidepressants, pain medicine, iron supplements, antacids, and water pills.    · Certain diseases, such as diabetes, irritable bowel syndrome (IBS), thyroid disease, or depression.    · Not drinking enough water.    · Not eating enough fiber-rich foods.    · Stress or travel.    · Lack of physical activity or exercise.    · Ignoring the urge to have a bowel movement.    · Using laxatives too much.    SIGNS AND SYMPTOMS   · Having fewer than three bowel movements a week.    · Straining to have a bowel movement.    · Having stools that are hard, dry, or larger than normal.    · Feeling full or bloated.    · Pain in the lower abdomen.    · Not feeling relief after having a bowel movement.    DIAGNOSIS   Your health care provider will take a medical history and perform a physical exam. Further testing may be done for severe constipation. Some tests may include:  · A barium enema X-ray to examine your rectum, colon, and, sometimes, your small intestine.    · A sigmoidoscopy to examine your lower colon.    · A colonoscopy to examine your entire colon.  TREATMENT   Treatment will depend on the severity of your constipation and what is causing it. Some dietary treatments include drinking more fluids and eating more fiber-rich foods. Lifestyle treatments may include regular exercise. If these diet and lifestyle recommendations do not help, your health care  provider may recommend taking over-the-counter laxative medicines to help you have bowel movements. Prescription medicines may be prescribed if over-the-counter medicines do not work.   HOME CARE INSTRUCTIONS   · Eat foods that have a lot of fiber, such as fruits, vegetables, whole grains, and beans.  · Limit foods high in fat and processed sugars, such as french fries, hamburgers, cookies, candies, and soda.    · A fiber supplement may be added to your diet if you cannot get enough fiber from foods.    · Drink enough fluids to keep your urine clear or pale yellow.    · Exercise regularly or as directed by your health care provider.    · Go to the restroom when you have the urge to go. Do not hold it.    · Only take over-the-counter or prescription medicines as directed by your health care provider. Do not take other medicines for constipation without talking to your health care provider first.    SEEK IMMEDIATE MEDICAL CARE IF:   · You have bright red blood in your stool.    · Your constipation lasts for more than 4 days or gets worse.    · You have abdominal or rectal pain.    · You have thin, pencil-like stools.    · You have unexplained weight loss.  MAKE SURE YOU:   · Understand these instructions.  · Will watch your condition.  · Will get help right away if you are not   you have with your health care provider. DASH Eating Plan DASH stands for "Dietary Approaches to Stop Hypertension." The DASH eating plan is a healthy eating plan that has been shown to reduce high blood pressure (hypertension). Additional health benefits may include reducing the risk of type 2 diabetes mellitus, heart disease, and stroke. The DASH  eating plan may also help with weight loss. WHAT DO I NEED TO KNOW ABOUT THE DASH EATING PLAN? For the DASH eating plan, you will follow these general guidelines:  Choose foods with a percent daily value for sodium of less than 5% (as listed on the food label).  Use salt-free seasonings or herbs instead of table salt or sea salt.  Check with your health care provider or pharmacist before using salt substitutes.  Eat lower-sodium products, often labeled as "lower sodium" or "no salt added."  Eat fresh foods.  Eat more vegetables, fruits, and low-fat dairy products.  Choose whole grains. Look for the word "whole" as the first word in the ingredient list.  Choose fish and skinless chicken or [place] more often than red meat. Limit fish, poultry, and meat to 6 oz (170 g) each day.  Limit sweets, desserts, sugars, and sugary drinks.  Choose heart-healthy fats.  Limit cheese to 1 oz (28 g) per day.  Eat more home-cooked food and less restaurant, buffet, and fast food.  Limit fried foods.  Cook foods using methods other than frying.  Limit canned vegetables. If you do use them, rinse them well to decrease the sodium.  When eating at a restaurant, ask that your food be prepared with less salt, or no salt if possible. WHAT FOODS CAN I EAT? Seek help from a dietitian for individual calorie needs. Grains Whole grain or whole wheat bread. Brown rice. Whole grain or whole wheat pasta. Quinoa, bulgur, and whole grain cereals. Low-sodium cereals. Corn or whole wheat flour tortillas. Whole grain cornbread. Whole grain crackers. Low-sodium crackers. Vegetables Fresh or frozen vegetables (raw, steamed, roasted, or grilled). Low-sodium or reduced-sodium tomato and vegetable juices. Low-sodium or reduced-sodium tomato sauce and paste. Low-sodium or reduced-sodium canned vegetables.  Fruits All fresh, canned (in natural juice), or frozen fruits. Meat and Other Protein Products Ground beef (85%  or leaner), grass-fed beef, or beef trimmed of fat. Skinless chicken or [place]. Ground chicken or [place]. Pork trimmed of fat. All fish and seafood. Eggs. Dried beans, peas, or lentils. Unsalted nuts and seeds. Unsalted canned beans. Dairy Low-fat dairy products, such as skim or 1% milk, 2% or reduced-fat cheeses, low-fat ricotta or cottage cheese, or plain low-fat yogurt. Low-sodium or reduced-sodium cheeses. Fats and Oils Tub margarines without trans fats. Light or reduced-fat mayonnaise and salad dressings (reduced sodium). Avocado. Safflower, olive, or canola oils. Natural peanut or almond butter. Other Unsalted popcorn and pretzels. The items listed above may not be a complete list of recommended foods or beverages. Contact your dietitian for more options. WHAT FOODS ARE NOT RECOMMENDED? Grains White bread. White pasta. White rice. Refined cornbread. Bagels and croissants. Crackers that contain trans fat. Vegetables Creamed or fried vegetables. Vegetables in a cheese sauce. Regular canned vegetables. Regular canned tomato sauce and paste. Regular tomato and vegetable juices. Fruits Dried fruits. Canned fruit in light or heavy syrup. Fruit juice. Meat and Other Protein Products Fatty cuts of meat. Ribs, chicken wings, bacon, sausage, bologna, salami, chitterlings, fatback, hot dogs, bratwurst, and packaged luncheon meats. Salted nuts and seeds. Canned beans with salt. Dairy Whole or 2% milk, cream, half-and-half,  and cream cheese. Whole-fat or sweetened yogurt. Full-fat cheeses or blue cheese. Nondairy creamers and whipped toppings. Processed cheese, cheese spreads, or cheese curds. Condiments Onion and garlic salt, seasoned salt, table salt, and sea salt. Canned and packaged gravies. Worcestershire sauce. Tartar sauce. Barbecue sauce. Teriyaki sauce. Soy sauce, including reduced sodium. Steak sauce. Fish sauce. Oyster sauce. Cocktail sauce. Horseradish. Ketchup and mustard. Meat flavorings  and tenderizers. Bouillon cubes. Hot sauce. Tabasco sauce. Marinades. Taco seasonings. Relishes. Fats and Oils Butter, stick margarine, lard, shortening, ghee, and bacon fat. Coconut, palm kernel, or palm oils. Regular salad dressings. Other Pickles and olives. Salted popcorn and pretzels. The items listed above may not be a complete list of foods and beverages to avoid. Contact your dietitian for more information. WHERE CAN I FIND MORE INFORMATION? National Heart, Lung, and [employer] Institute: travelstabloid.com Document Released: 04/17/2011 Document Revised: 05/03/2013 Document Reviewed: 03/02/2013 [facility] Patient Information 2015 [place], [place]. This information is not intended to replace advice given to you by your health care provider. Make sure you discuss any questions you have with your health care provider. Hypertension Hypertension, commonly called high blood pressure, is when the force of blood pumping through your arteries is too strong. Your arteries are the blood vessels that carry blood from your heart throughout your body. A blood pressure reading consists of a higher number over a lower number, such as 110/72. The higher number (systolic) is the pressure inside your arteries when your heart pumps. The lower number (diastolic) is the pressure inside your arteries when your heart relaxes. Ideally you want your blood pressure below 120/80. Hypertension forces your heart to work harder to pump blood. Your arteries may become narrow or stiff. Having hypertension puts you at risk for heart disease, stroke, and other problems.  RISK FACTORS Some risk factors for high blood pressure are controllable. Others are not.  Risk factors you cannot control include:   Race. You may be at higher risk if you are African American.  Age. Risk increases with age.  Gender. Men are at higher risk than women before age 34 years. After age 31, women are at higher risk than  men. Risk factors you can control include:  Not getting enough exercise or physical activity.  Being overweight.  Getting too much fat, sugar, calories, or salt in your diet.  Drinking too much alcohol. SIGNS AND SYMPTOMS Hypertension does not usually cause signs or symptoms. Extremely high blood pressure (hypertensive crisis) may cause headache, anxiety, shortness of breath, and nosebleed. DIAGNOSIS  To check if you have hypertension, your health care provider will measure your blood pressure while you are seated, with your arm held at the level of your heart. It should be measured at least twice using the same arm. Certain conditions can cause a difference in blood pressure between your right and left arms. A blood pressure reading that is higher than normal on one occasion does not mean that you need treatment. If one blood pressure reading is high, ask your health care provider about having it checked again. TREATMENT  Treating high blood pressure includes making lifestyle changes and possibly taking medication. Living a healthy lifestyle can help lower high blood pressure. You may need to change some of your habits. Lifestyle changes may include:  Following the DASH diet. This diet is high in fruits, vegetables, and whole grains. It is low in salt, red meat, and added sugars.  Getting at least 2 1/2 hours of brisk physical activity every week.  Losing weight if necessary.  Not smoking.  Limiting alcoholic beverages.  Learning ways to reduce stress. If lifestyle changes are not enough to get your blood pressure under control, your health care provider may prescribe medicine. You may need to take more than one. Work closely with your health care provider to understand the risks and benefits. HOME CARE INSTRUCTIONS  Have your blood pressure rechecked as directed by your health care provider.   Only take medicine as directed by your health care provider. Follow the directions  carefully. Blood pressure medicines must be taken as prescribed. The medicine does not work as well when you skip doses. Skipping doses also puts you at risk for problems.   Do not smoke.   Monitor your blood pressure at home as directed by your health care provider. SEEK MEDICAL CARE IF:   You think you are having a reaction to medicines taken.  You have recurrent headaches or feel dizzy.  You have swelling in your ankles.  You have trouble with your vision. SEEK IMMEDIATE MEDICAL CARE IF:  You develop a severe headache or confusion.  You have unusual weakness, numbness, or feel faint.  You have severe chest or abdominal pain.  You vomit repeatedly.  You have trouble breathing. MAKE SURE YOU:   Understand these instructions.  Will watch your condition.  Will get help right away if you are not doing well or get worse. Document Released: 04/28/2005 Document Revised: 05/03/2013 Document Reviewed: 02/18/2013 [facility] Patient Information 2015 [place], [place]. This information is not intended to replace advice given to you by your health care provider. Make sure you discuss any questions you have with your health care provider.

## 2013-11-04 NOTE — Progress Notes (Signed)
   Subjective:    Patient ID: Kathryn Stein, female    DOB: 04-26-1946, 68 y.o.   MRN: [id number]  HPI Pt presents to the office for chronic follow up for depression, osteoporosis, vitamin D deficiency. Pt also has a hx of hyperlipidemia, but has never been started on any medication. PT denies pain, SOB, or edema. She states takes her medications as prescribed with no complaints or concerns at this time.    Review of Systems  HENT: Negative.   Respiratory: Negative.   Cardiovascular: Negative.   Gastrointestinal: Negative.   Genitourinary: Negative.   Musculoskeletal: Negative.   Hematological: Negative.   All other systems reviewed and are negative.      Objective:   Physical Exam  Vitals reviewed. Constitutional: She is oriented to person, place, and time. She appears well-developed and well-nourished. No distress.  HENT:  Head: Normocephalic and atraumatic.  Right Ear: External ear normal.  Mouth/Throat: Oropharynx is clear and moist.  Eyes: Pupils are equal, round, and reactive to light.  Neck: Normal range of motion. Neck supple. No thyromegaly present.  Cardiovascular: Normal rate, regular rhythm, normal heart sounds and intact distal pulses.   No murmur heard. Pulmonary/Chest: Effort normal and breath sounds normal. No respiratory distress. She has no wheezes.  Abdominal: Soft. Bowel sounds are normal. She exhibits no distension. There is tenderness.  Mild tenderness in all four quadrants PT states she is feeling slightly constipated    Musculoskeletal: Normal range of motion. She exhibits no edema and no tenderness.  Neurological: She is alert and oriented to person, place, and time. She has normal reflexes. No cranial nerve deficit.  Skin: Skin is warm and dry.  Psychiatric: She has a normal mood and affect. Her behavior is normal. Judgment and thought content normal.    BP 144/85  Pulse 64  Temp(Src) 97.4 F (36.3 C) (Oral)  Ht $R'5\' 4"'uG$  (1.626 m)  Wt 144 lb 12.8  oz (65.681 kg)  BMI 24.84 kg/m2       Assessment & Plan:  1. Osteoporosis, unspecified  2. Depressive disorder, not elsewhere classified  3. HLD (hyperlipidemia) - Lipid panel  4. Unspecified vitamin D deficiency - Vit D  25 hydroxy (rtn osteoporosis monitoring)  5. Unspecified constipation  6. Essential hypertension, benign -Dash diet information given -Exercise encouraged - Stress Management  -Continue current meds -RTO in 2 weeks - CMP14+EGFR - hydrochlorothiazide (HYDRODIURIL) 12.5 MG tablet; Take 1 tablet (12.5 mg total) by mouth daily.  Dispense: 90 tablet; Refill: 3  7. Constipation, unspecified constipation type -Pt to start Miralax BID until she feels better-then switch to daily or prn -Information given on constipation   Continue all meds Labs pending Health Maintenance reviewed Diet and exercise encouraged RTO 2 weeks for blood pressure  Evelina Dun, FNP

## 2013-11-05 LAB — CMP14+EGFR
A/G RATIO: 2.4 (ref 1.1–2.5)
ALBUMIN: 4.5 g/dL (ref 3.6–4.8)
ALK PHOS: 55 IU/L (ref 39–117)
ALT: 10 IU/L (ref 0–32)
AST: 15 IU/L (ref 0–40)
BUN / CREAT RATIO: 26 (ref 11–26)
BUN: 19 mg/dL (ref 8–27)
CALCIUM: 9.4 mg/dL (ref 8.7–10.3)
CO2: 25 mmol/L (ref 18–29)
CREATININE: 0.72 mg/dL (ref 0.57–1.00)
Chloride: 102 mmol/L (ref 97–108)
GFR calc Af Amer: 100 mL/min/{1.73_m2} (ref >59)
GFR, EST NON AFRICAN AMERICAN: 87 mL/min/{1.73_m2} (ref >59)
GLOBULIN, TOTAL: 1.9 g/dL (ref 1.5–4.5)
Glucose: 77 mg/dL (ref 65–99)
Potassium: 4.4 mmol/L (ref 3.5–5.2)
Sodium: 142 mmol/L (ref 134–144)
Total Bilirubin: 0.5 mg/dL (ref 0.0–1.2)
Total Protein: 6.4 g/dL (ref 6.0–8.5)

## 2013-11-05 LAB — LIPID PANEL
Chol/HDL Ratio: 2.7 ratio units (ref 0.0–4.4)
Cholesterol, Total: 206 mg/dL — ABNORMAL HIGH (ref 100–199)
HDL: 76 mg/dL (ref >39)
LDL Calculated: 119 mg/dL — ABNORMAL HIGH (ref 0–99)
TRIGLYCERIDES: 56 mg/dL (ref 0–149)
VLDL CHOLESTEROL CAL: 11 mg/dL (ref 5–40)

## 2013-11-05 LAB — VITAMIN D 25 HYDROXY (VIT D DEFICIENCY, FRACTURES): Vit D, 25-Hydroxy: 27.9 ng/mL — ABNORMAL LOW (ref 30.0–100.0)

## 2013-11-07 ENCOUNTER — Other Ambulatory Visit: Payer: Self-pay | Admitting: Family

## 2013-11-07 ENCOUNTER — Telehealth: Payer: Self-pay | Admitting: Family Medicine

## 2013-11-07 MED ORDER — SIMVASTATIN 40 MG PO TABS: 40.0000 mg | ORAL_TABLET | Freq: Every day | ORAL | Status: DC

## 2013-11-07 MED ORDER — VITAMIN D (ERGOCALCIFEROL) 1.25 MG (50000 UNIT) PO CAPS
50000.0000 [IU] | ORAL_CAPSULE | ORAL | Status: DC
Start: 2013-11-07 — End: 2015-04-13

## 2013-11-07 NOTE — Telephone Encounter (Signed)
Message copied by Waverly Ferrari on Mon Nov 07, 2013 12:29 PM ------      Message from: [name], [place] A      Created: Mon Nov 07, 2013  9:53 AM       Kidney and liver function stable      Cholesterol levels high- RX sent to pharmacy      Vit D low- RX sent to pharmacy ------

## 2013-11-08 NOTE — Telephone Encounter (Signed)
Patient aware.

## 2013-11-22 ENCOUNTER — Ambulatory Visit (INDEPENDENT_AMBULATORY_CARE_PROVIDER_SITE_OTHER): Payer: Medicare HMO | Admitting: Family

## 2013-11-22 ENCOUNTER — Encounter: Payer: Self-pay | Admitting: Family

## 2013-11-22 VITALS — BP 107/73 | HR 71 | Temp 98.2°F | Ht 64.0 in | Wt 148.2 lb

## 2013-11-22 DIAGNOSIS — I10 Essential (primary) hypertension: Secondary | ICD-10-CM | POA: Insufficient documentation

## 2013-11-22 DIAGNOSIS — Z23 Encounter for immunization: Secondary | ICD-10-CM

## 2013-11-22 NOTE — Progress Notes (Signed)
   Subjective:    Patient ID: Kathryn Stein, female    DOB: 1945-10-06, 68 y.o.   MRN: [id number]  Hypertension Pertinent negatives include no headaches, palpitations or shortness of breath.   Pt presents to the office today for follow-up on her hypertension. Pt was seen in office two weeks ago and started on hydrochlorothiazide 12.5 mg. Pt denies pain, SOB, palpations, or edema at this time.    Review of Systems  Constitutional: Negative.   HENT: Negative.   Eyes: Negative.   Respiratory: Negative.  Negative for shortness of breath.   Cardiovascular: Negative.  Negative for palpitations.  Gastrointestinal: Negative.   Endocrine: Negative.   Genitourinary: Negative.   Musculoskeletal: Negative.   Neurological: Negative.  Negative for headaches.  Hematological: Negative.   Psychiatric/Behavioral: Negative.   All other systems reviewed and are negative.      Objective:   Physical Exam  Vitals reviewed. Constitutional: She is oriented to person, place, and time. She appears well-developed and well-nourished. No distress.  Cardiovascular: Normal rate, regular rhythm, normal heart sounds and intact distal pulses.   No murmur heard. Pulmonary/Chest: Effort normal and breath sounds normal. No respiratory distress. She has no wheezes.  Abdominal: Soft. Bowel sounds are normal. She exhibits no distension. There is no tenderness.  Musculoskeletal: Normal range of motion. She exhibits no edema and no tenderness.  Neurological: She is alert and oriented to person, place, and time. She has normal reflexes. No cranial nerve deficit.  Skin: Skin is warm and dry.  Psychiatric: She has a normal mood and affect. Her behavior is normal. Judgment and thought content normal.   BP 107/73  Pulse 71  Temp(Src) 98.2 F (36.8 C) (Oral)  Ht 5\' 4"  (1.626 m)  Wt 148 lb 3.2 oz (67.223 kg)  BMI 25.43 kg/m2        Assessment & Plan:  1. Essential hypertension, benign -Dash diet information  given -Exercise encouraged - Stress Management  -Continue current meds -RTO in 3 months for chronic follow-up  -Zostavax Vaccine given today  Evelina Dun, FNP

## 2013-11-22 NOTE — Patient Instructions (Signed)
Hypertension Hypertension, commonly called high blood pressure, is when the force of blood pumping through your arteries is too strong. Your arteries are the blood vessels that carry blood from your heart throughout your body. A blood pressure reading consists of a higher number over a lower number, such as 110/72. The higher number (systolic) is the pressure inside your arteries when your heart pumps. The lower number (diastolic) is the pressure inside your arteries when your heart relaxes. Ideally you want your blood pressure below 120/80. Hypertension forces your heart to work harder to pump blood. Your arteries may become narrow or stiff. Having hypertension puts you at risk for heart disease, stroke, and other problems.  RISK FACTORS Some risk factors for high blood pressure are controllable. Others are not.  Risk factors you cannot control include:   Race. You may be at higher risk if you are African American.  Age. Risk increases with age.  Gender. Men are at higher risk than women before age 45 years. After age 65, women are at higher risk than men. Risk factors you can control include:  Not getting enough exercise or physical activity.  Being overweight.  Getting too much fat, sugar, calories, or salt in your diet.  Drinking too much alcohol. SIGNS AND SYMPTOMS Hypertension does not usually cause signs or symptoms. Extremely high blood pressure (hypertensive crisis) may cause headache, anxiety, shortness of breath, and nosebleed. DIAGNOSIS  To check if you have hypertension, your health care provider will measure your blood pressure while you are seated, with your arm held at the level of your heart. It should be measured at least twice using the same arm. Certain conditions can cause a difference in blood pressure between your right and left arms. A blood pressure reading that is higher than normal on one occasion does not mean that you need treatment. If one blood pressure reading  is high, ask your health care provider about having it checked again. TREATMENT  Treating high blood pressure includes making lifestyle changes and possibly taking medication. Living a healthy lifestyle can help lower high blood pressure. You may need to change some of your habits. Lifestyle changes may include:  Following the DASH diet. This diet is high in fruits, vegetables, and whole grains. It is low in salt, red meat, and added sugars.  Getting at least 2 1/2 hours of brisk physical activity every week.  Losing weight if necessary.  Not smoking.  Limiting alcoholic beverages.  Learning ways to reduce stress. If lifestyle changes are not enough to get your blood pressure under control, your health care provider may prescribe medicine. You may need to take more than one. Work closely with your health care provider to understand the risks and benefits. HOME CARE INSTRUCTIONS  Have your blood pressure rechecked as directed by your health care provider.   Only take medicine as directed by your health care provider. Follow the directions carefully. Blood pressure medicines must be taken as prescribed. The medicine does not work as well when you skip doses. Skipping doses also puts you at risk for problems.   Do not smoke.   Monitor your blood pressure at home as directed by your health care provider. SEEK MEDICAL CARE IF:   You think you are having a reaction to medicines taken.  You have recurrent headaches or feel dizzy.  You have swelling in your ankles.  You have trouble with your vision. SEEK IMMEDIATE MEDICAL CARE IF:  You develop a severe headache or   confusion.  You have unusual weakness, numbness, or feel faint.  You have severe chest or abdominal pain.  You vomit repeatedly.  You have trouble breathing. MAKE SURE YOU:   Understand these instructions.  Will watch your condition.  Will get help right away if you are not doing well or get  worse. Document Released: 04/28/2005 Document Revised: 05/03/2013 Document Reviewed: 02/18/2013 Exit[employer] Patient Information 2015 [employer]. This information is not intended to replace advice given to you by your health care provider. Make sure you discuss any questions you have with your health care provider. DASH Eating Plan DASH stands for "Dietary Approaches to Stop Hypertension." The DASH eating plan is a healthy eating plan that has been shown to reduce high blood pressure (hypertension). Additional health benefits may include reducing the risk of type 2 diabetes mellitus, heart disease, and stroke. The DASH eating plan may also help with weight loss. WHAT DO I NEED TO KNOW ABOUT THE DASH EATING PLAN? For the DASH eating plan, you will follow these general guidelines:  Choose foods with a percent daily value for sodium of less than 5% (as listed on the food label).  Use salt-free seasonings or herbs instead of table salt or sea salt.  Check with your health care provider or pharmacist before using salt substitutes.  Eat lower-sodium products, often labeled as "lower sodium" or "no salt added."  Eat fresh foods.  Eat more vegetables, fruits, and low-fat dairy products.  Choose whole grains. Look for the word "whole" as the first word in the ingredient list.  Choose fish and skinless chicken or turkey more often than red meat. Limit fish, poultry, and meat to 6 oz (170 g) each day.  Limit sweets, desserts, sugars, and sugary drinks.  Choose heart-healthy fats.  Limit cheese to 1 oz (28 g) per day.  Eat more home-cooked food and less restaurant, buffet, and fast food.  Limit fried foods.  Cook foods using methods other than frying.  Limit canned vegetables. If you do use them, rinse them well to decrease the sodium.  When eating at a restaurant, ask that your food be prepared with less salt, or no salt if possible. WHAT FOODS CAN I EAT? Seek help from a dietitian for  individual calorie needs. Grains Whole grain or whole wheat bread. Brown rice. Whole grain or whole wheat pasta. Quinoa, bulgur, and whole grain cereals. Low-sodium cereals. Corn or whole wheat flour tortillas. Whole grain cornbread. Whole grain crackers. Low-sodium crackers. Vegetables Fresh or frozen vegetables (raw, steamed, roasted, or grilled). Low-sodium or reduced-sodium tomato and vegetable juices. Low-sodium or reduced-sodium tomato sauce and paste. Low-sodium or reduced-sodium canned vegetables.  Fruits All fresh, canned (in natural juice), or frozen fruits. Meat and Other Protein Products Ground beef (85% or leaner), grass-fed beef, or beef trimmed of fat. Skinless chicken or turkey. Ground chicken or turkey. Pork trimmed of fat. All fish and seafood. Eggs. Dried beans, peas, or lentils. Unsalted nuts and seeds. Unsalted canned beans. Dairy Low-fat dairy products, such as skim or 1% milk, 2% or reduced-fat cheeses, low-fat ricotta or cottage cheese, or plain low-fat yogurt. Low-sodium or reduced-sodium cheeses. Fats and Oils Tub margarines without trans fats. Light or reduced-fat mayonnaise and salad dressings (reduced sodium). Avocado. Safflower, olive, or canola oils. Natural peanut or almond butter. Other Unsalted popcorn and pretzels. The items listed above may not be a complete list of recommended foods or beverages. Contact your dietitian for more options. WHAT FOODS ARE NOT RECOMMENDED? Grains   White bread. White pasta. White rice. Refined cornbread. Bagels and croissants. Crackers that contain trans fat. Vegetables Creamed or fried vegetables. Vegetables in a cheese sauce. Regular canned vegetables. Regular canned tomato sauce and paste. Regular tomato and vegetable juices. Fruits Dried fruits. Canned fruit in light or heavy syrup. Fruit juice. Meat and Other Protein Products Fatty cuts of meat. Ribs, chicken wings, bacon, sausage, bologna, salami, chitterlings, fatback, hot  dogs, bratwurst, and packaged luncheon meats. Salted nuts and seeds. Canned beans with salt. Dairy Whole or 2% milk, cream, half-and-half, and cream cheese. Whole-fat or sweetened yogurt. Full-fat cheeses or blue cheese. Nondairy creamers and whipped toppings. Processed cheese, cheese spreads, or cheese curds. Condiments Onion and garlic salt, seasoned salt, table salt, and sea salt. Canned and packaged gravies. Worcestershire sauce. Tartar sauce. Barbecue sauce. Teriyaki sauce. Soy sauce, including reduced sodium. Steak sauce. Fish sauce. Oyster sauce. Cocktail sauce. Horseradish. Ketchup and mustard. Meat flavorings and tenderizers. Bouillon cubes. Hot sauce. Tabasco sauce. Marinades. Taco seasonings. Relishes. Fats and Oils Butter, stick margarine, lard, shortening, ghee, and bacon fat. Coconut, palm kernel, or palm oils. Regular salad dressings. Other Pickles and olives. Salted popcorn and pretzels. The items listed above may not be a complete list of foods and beverages to avoid. Contact your dietitian for more information. WHERE CAN I FIND MORE INFORMATION? [employer], [employer]: www.nhlbi.nih.gov/health/health-topics/topics/dash/ Document Released: 04/17/2011 Document Revised: 05/03/2013 Document Reviewed: 03/02/2013 [employer] Patient Information 2015 [employer]. This information is not intended to replace advice given to you by your health care provider. Make sure you discuss any questions you have with your health care provider.  

## 2013-11-23 ENCOUNTER — Telehealth: Payer: Self-pay | Admitting: Family

## 2013-11-23 NOTE — Telephone Encounter (Signed)
Message copied by Cline Crock on Wed Nov 23, 2013  2:41 PM ------      Message from: [name], [place] A      Created: Mon Nov 07, 2013  9:53 AM       Kidney and liver function stable      Cholesterol levels high- RX sent to pharmacy      Vit D low- RX sent to pharmacy ------

## 2013-11-24 ENCOUNTER — Encounter: Payer: Self-pay | Admitting: Family

## 2014-02-22 ENCOUNTER — Ambulatory Visit: Payer: Medicare HMO | Admitting: Family

## 2014-04-13 ENCOUNTER — Other Ambulatory Visit: Payer: Self-pay | Admitting: *Deleted

## 2014-04-13 MED ORDER — CYPROHEPTADINE HCL 4 MG PO TABS: ORAL_TABLET | ORAL | Status: DC

## 2014-06-27 ENCOUNTER — Other Ambulatory Visit: Payer: Self-pay | Admitting: *Deleted

## 2014-06-27 MED ORDER — CYPROHEPTADINE HCL 4 MG PO TABS: ORAL_TABLET | ORAL | Status: DC

## 2014-06-27 NOTE — Telephone Encounter (Signed)
Last seen 11/2013 had an appt in October but was a no show.

## 2014-07-21 ENCOUNTER — Encounter: Payer: Self-pay | Admitting: Pharmacist

## 2014-07-21 ENCOUNTER — Ambulatory Visit (INDEPENDENT_AMBULATORY_CARE_PROVIDER_SITE_OTHER): Payer: Medicare Other | Admitting: Pharmacist

## 2014-07-21 VITALS — BP 120/68 | HR 76 | Ht 63.0 in | Wt 151.0 lb

## 2014-07-21 DIAGNOSIS — H538 Other visual disturbances: Secondary | ICD-10-CM

## 2014-07-21 DIAGNOSIS — Z0000 Encounter for general adult medical examination without abnormal findings: Secondary | ICD-10-CM

## 2014-07-21 DIAGNOSIS — Z23 Encounter for immunization: Secondary | ICD-10-CM | POA: Diagnosis not present

## 2014-07-21 DIAGNOSIS — H9193 Unspecified hearing loss, bilateral: Secondary | ICD-10-CM

## 2014-07-21 MED ORDER — CYPROHEPTADINE HCL 4 MG PO TABS
ORAL_TABLET | ORAL | Status: DC
Start: 2014-07-21 — End: 2014-09-21

## 2014-07-21 NOTE — Patient Instructions (Signed)
Health Maintenance Summary     Pneumonia Vaccine Completed 07/21/2014     MAMMOGRAM Overdue 02/08/2014 appt made for Wednesday, May 25th at 10:00am [facility] [place] Next Due 12/11/2014     COLONOSCOPY Next Due 09/23/2015 Last done 07/14/215   Zostavax / Shingles vaccine Completed 11/2013    Bone Density/ DEXA Next  Due 09/02/2015 Last done 09/01/2013    TETANUS/TDAP Next Due 07/10/2020 Last done 07/11/2010      We are sending referrals for appointment with [email or web address] and for audiologist (hearing specialist) to evaluate hearing.  Consider seeing dentist to evaluate for jaw pain / possible TMJ.  Try to decrease cyproheptadine 41m to 1 tablet twice a day.  If your weight decreases then change back to 3 times a day.   Preventive Care for Adults A healthy lifestyle and preventive care can promote health and wellness. Preventive health guidelines for women include the following key practices.  A routine yearly physical is a good way to check with your health care provider about your health and preventive screening. It is a chance to share any concerns and updates on your health and to receive a thorough exam.  Visit your dentist for a routine exam and preventive care every 6 months. Brush your teeth twice a day and floss once a day. Good oral hygiene prevents tooth decay and gum disease.  The frequency of eye exams is based on your age, health, family medical history, use of contact lenses, and other factors. Follow your health care provider's recommendations for frequency of eye exams.  Eat a healthy diet. Foods like vegetables, fruits, whole grains, low-fat dairy products, and lean protein foods contain the nutrients you need without too many calories. Decrease your intake of foods high in solid fats, added sugars, and salt. Eat the right amount of calories for you.Get information about a proper diet from your health care provider, if  necessary.  Regular physical exercise is one of the most important things you can do for your health. Most adults should get at least 150 minutes of moderate-intensity exercise (any activity that increases your heart rate and causes you to sweat) each week. In addition, most adults need muscle-strengthening exercises on 2 or more days a week.  Maintain a healthy weight. The body mass index (BMI) is a screening tool to identify possible weight problems. It provides an estimate of body fat based on height and weight. Your health care provider can find your BMI and can help you achieve or maintain a healthy weight.For adults 20 years and older:  A BMI below 18.5 is considered underweight.  A BMI of 18.5 to 24.9 is normal.  A BMI of 25 to 29.9 is considered overweight.  A BMI of 30 and above is considered obese.  Maintain normal blood lipids and cholesterol levels by exercising and minimizing your intake of saturated fat. Eat a balanced diet with plenty of fruit and vegetables. Blood tests for lipids and cholesterol should begin at age 4163and be repeated every 5 years. If your lipid or cholesterol levels are high, you are over 50, or you are at high risk for heart disease, you may need your cholesterol levels checked more frequently.Ongoing high lipid and cholesterol levels should be treated with medicines if diet and exercise are not working.  If you smoke, find out from your health care provider how to quit. If you do not use tobacco, do not start.  Lung cancer screening is recommended for adults aged 78-80 years who are at high risk for developing lung cancer because of a history of smoking. A yearly low-dose CT scan of the lungs is recommended for people who have at least a 30-pack-year history of smoking and are a current smoker or have quit within the past 15 years. A pack year of smoking is smoking an average of 1 pack of cigarettes a day for 1 year (for example: 1 pack a day for 30 years or 2  packs a day for 15 years). Yearly screening should continue until the smoker has stopped smoking for at least 15 years. Yearly screening should be stopped for people who develop a health problem that would prevent them from having lung cancer treatment.  High blood pressure causes heart disease and increases the risk of stroke. Your blood pressure should be checked at least every 1 to 2 years. Ongoing high blood pressure should be treated with medicines if weight loss and exercise do not work.  If you are 52-31 years old, ask your health care provider if you should take aspirin to prevent strokes.  Diabetes screening involves taking a blood sample to check your fasting blood sugar level. This should be done once every 3 years, after age 16, if you are within normal weight and without risk factors for diabetes. Testing should be considered at a younger age or be carried out more frequently if you are overweight and have at least 1 risk factor for diabetes.  Breast cancer screening is essential preventive care for women. You should practice "breast self-awareness." This means understanding the normal appearance and feel of your breasts and may include breast self-examination. Any changes detected, no matter how small, should be reported to a health care provider. Women in their 7s and 30s should have a clinical breast exam (CBE) by a health care provider as part of a regular health exam every 1 to 3 years. After age 12, women should have a CBE every year. Starting at age 59, women should consider having a mammogram (breast X-ray test) every year. Women who have a family history of breast cancer should talk to their health care provider about genetic screening. Women at a high risk of breast cancer should talk to their health care providers about having an MRI and a mammogram every year.  Breast cancer gene (BRCA)-related cancer risk assessment is recommended for women who have family members with  BRCA-related cancers. BRCA-related cancers include breast, ovarian, tubal, and peritoneal cancers. Having family members with these cancers may be associated with an increased risk for harmful changes (mutations) in the breast cancer genes BRCA1 and BRCA2. Results of the assessment will determine the need for genetic counseling and BRCA1 and BRCA2 testing.  Routine pelvic exams to screen for cancer are no longer recommended for nonpregnant women who are considered low risk for cancer of the pelvic organs (ovaries, uterus, and vagina) and who do not have symptoms. Ask your health care provider if a screening pelvic exam is right for you.  If you have had past treatment for cervical cancer or a condition that could lead to cancer, you need Pap tests and screening for cancer for at least 20 years after your treatment. If Pap tests have been discontinued, your risk factors (such as having a new sexual partner) need to be reassessed to determine if screening should be resumed. Some women have medical problems that increase the chance of getting cervical cancer. In these  cases, your health care provider may recommend more frequent screening and Pap tests.  The HPV test is an additional test that may be used for cervical cancer screening. The HPV test looks for the virus that can cause the cell changes on the cervix. The cells collected during the Pap test can be tested for HPV. The HPV test could be used to screen women aged 69 years and older, and should be used in women of any age who have unclear Pap test results. After the age of 55, women should have HPV testing at the same frequency as a Pap test.  Colorectal cancer can be detected and often prevented. Most routine colorectal cancer screening begins at the age of 49 years and continues through age 17 years. However, your health care provider may recommend screening at an earlier age if you have risk factors for colon cancer. On a yearly basis, your health  care provider may provide home test kits to check for hidden blood in the stool. Use of a small camera at the end of a tube, to directly examine the colon (sigmoidoscopy or colonoscopy), can detect the earliest forms of colorectal cancer. Talk to your health care provider about this at age 47, when routine screening begins. Direct exam of the colon should be repeated every 5-10 years through age 62 years, unless early forms of pre-cancerous polyps or small growths are found.  People who are at an increased risk for hepatitis B should be screened for this virus. You are considered at high risk for hepatitis B if:  You were born in a country where hepatitis B occurs often. Talk with your health care provider about which countries are considered high risk.  Your parents were born in a high-risk country and you have not received a shot to protect against hepatitis B (hepatitis B vaccine).  You have HIV or AIDS.  You use needles to inject street drugs.  You live with, or have sex with, someone who has hepatitis B.  You get hemodialysis treatment.  You take certain medicines for conditions like cancer, organ transplantation, and autoimmune conditions.  Hepatitis C blood testing is recommended for all people born from [birth year] through 1965 and any individual with known risks for hepatitis C.  Practice safe sex. Use condoms and avoid high-risk sexual practices to reduce the spread of sexually transmitted infections (STIs). STIs include gonorrhea, chlamydia, syphilis, trichomonas, herpes, HPV, and human immunodeficiency virus (HIV). Herpes, HIV, and HPV are viral illnesses that have no cure. They can result in disability, cancer, and death.  You should be screened for sexually transmitted illnesses (STIs) including gonorrhea and chlamydia if:  You are sexually active and are younger than 24 years.  You are older than 24 years and your health care provider tells you that you are at risk for this type of  infection.  Your sexual activity has changed since you were last screened and you are at an increased risk for chlamydia or gonorrhea. Ask your health care provider if you are at risk.  If you are at risk of being infected with HIV, it is recommended that you take a prescription medicine daily to prevent HIV infection. This is called preexposure prophylaxis (PrEP). You are considered at risk if:  You are a heterosexual woman, are sexually active, and are at increased risk for HIV infection.  You take drugs by injection.  You are sexually active with a partner who has HIV.  Talk with your health care provider  about whether you are at high risk of being infected with HIV. If you choose to begin PrEP, you should first be tested for HIV. You should then be tested every 3 months for as long as you are taking PrEP.  Osteoporosis is a disease in which the bones lose minerals and strength with aging. This can result in serious bone fractures or breaks. The risk of osteoporosis can be identified using a bone density scan. Women ages 95 years and over and women at risk for fractures or osteoporosis should discuss screening with their health care providers. Ask your health care provider whether you should take a calcium supplement or vitamin D to reduce the rate of osteoporosis.  Menopause can be associated with physical symptoms and risks. Hormone replacement therapy is available to decrease symptoms and risks. You should talk to your health care provider about whether hormone replacement therapy is right for you.  Use sunscreen. Apply sunscreen liberally and repeatedly throughout the day. You should seek shade when your shadow is shorter than you. Protect yourself by wearing long sleeves, pants, a wide-brimmed hat, and sunglasses year round, whenever you are outdoors.  Once a month, do a whole body skin exam, using a mirror to look at the skin on your back. Tell your health care provider of new moles,  moles that have irregular borders, moles that are larger than a pencil eraser, or moles that have changed in shape or color.  Stay current with required vaccines (immunizations).  Influenza vaccine. All adults should be immunized every year.  Tetanus, diphtheria, and acellular pertussis (Td, Tdap) vaccine. Pregnant women should receive 1 dose of Tdap vaccine during each pregnancy. The dose should be obtained regardless of the length of time since the last dose. Immunization is preferred during the 27th-36th week of gestation. An adult who has not previously received Tdap or who does not know her vaccine status should receive 1 dose of Tdap. This initial dose should be followed by tetanus and diphtheria toxoids (Td) booster doses every 10 years. Adults with an unknown or incomplete history of completing a 3-dose immunization series with Td-containing vaccines should begin or complete a primary immunization series including a Tdap dose. Adults should receive a Td booster every 10 years.  Varicella vaccine. An adult without evidence of immunity to varicella should receive 2 doses or a second dose if she has previously received 1 dose. Pregnant females who do not have evidence of immunity should receive the first dose after pregnancy. This first dose should be obtained before leaving the health care facility. The second dose should be obtained 4-8 weeks after the first dose.  Human papillomavirus (HPV) vaccine. Females aged 13-26 years who have not received the vaccine previously should obtain the 3-dose series. The vaccine is not recommended for use in pregnant females. However, pregnancy testing is not needed before receiving a dose. If a female is found to be pregnant after receiving a dose, no treatment is needed. In that case, the remaining doses should be delayed until after the pregnancy. Immunization is recommended for any person with an immunocompromised condition through the age of 75 years if she  did not get any or all doses earlier. During the 3-dose series, the second dose should be obtained 4-8 weeks after the first dose. The third dose should be obtained 24 weeks after the first dose and 16 weeks after the second dose.  Zoster vaccine. One dose is recommended for adults aged 27 years or older unless  certain conditions are present.  Measles, mumps, and rubella (MMR) vaccine. Adults born before [birth year] generally are considered immune to measles and mumps. Adults born in [birth year] or later should have 1 or more doses of MMR vaccine unless there is a contraindication to the vaccine or there is laboratory evidence of immunity to each of the three diseases. A routine second dose of MMR vaccine should be obtained at least 28 days after the first dose for students attending postsecondary schools, health care workers, or international travelers. People who received inactivated measles vaccine or an unknown type of measles vaccine during 1963-1967 should receive 2 doses of MMR vaccine. People who received inactivated mumps vaccine or an unknown type of mumps vaccine before 1979 and are at high risk for mumps infection should consider immunization with 2 doses of MMR vaccine. For females of childbearing age, rubella immunity should be determined. If there is no evidence of immunity, females who are not pregnant should be vaccinated. If there is no evidence of immunity, females who are pregnant should delay immunization until after pregnancy. Unvaccinated health care workers born before 55 who lack laboratory evidence of measles, mumps, or rubella immunity or laboratory confirmation of disease should consider measles and mumps immunization with 2 doses of MMR vaccine or rubella immunization with 1 dose of MMR vaccine.  Pneumococcal 13-valent conjugate (PCV13) vaccine. When indicated, a person who is uncertain of her immunization history and has no record of immunization should receive the PCV13 vaccine. An adult  aged 48 years or older who has certain medical conditions and has not been previously immunized should receive 1 dose of PCV13 vaccine. This PCV13 should be followed with a dose of pneumococcal polysaccharide (PPSV23) vaccine. The PPSV23 vaccine dose should be obtained at least 8 weeks after the dose of PCV13 vaccine. An adult aged 30 years or older who has certain medical conditions and previously received 1 or more doses of PPSV23 vaccine should receive 1 dose of PCV13. The PCV13 vaccine dose should be obtained 1 or more years after the last PPSV23 vaccine dose.  Pneumococcal polysaccharide (PPSV23) vaccine. When PCV13 is also indicated, PCV13 should be obtained first. All adults aged 58 years and older should be immunized. An adult younger than age 32 years who has certain medical conditions should be immunized. Any person who resides in a nursing home or long-term care facility should be immunized. An adult smoker should be immunized. People with an immunocompromised condition and certain other conditions should receive both PCV13 and PPSV23 vaccines. People with human immunodeficiency virus (HIV) infection should be immunized as soon as possible after diagnosis. Immunization during chemotherapy or radiation therapy should be avoided. Routine use of PPSV23 vaccine is not recommended for American Indians, [place] Natives, or people younger than 65 years unless there are medical conditions that require PPSV23 vaccine. When indicated, people who have unknown immunization and have no record of immunization should receive PPSV23 vaccine. One-time revaccination 5 years after the first dose of PPSV23 is recommended for people aged 19-64 years who have chronic kidney failure, nephrotic syndrome, asplenia, or immunocompromised conditions. People who received 1-2 doses of PPSV23 before age 48 years should receive another dose of PPSV23 vaccine at age 40 years or later if at least 5 years have passed since the previous  dose. Doses of PPSV23 are not needed for people immunized with PPSV23 at or after age 81 years.  Meningococcal vaccine. Adults with asplenia or persistent complement component deficiencies should receive 2 doses of  quadrivalent meningococcal conjugate (MenACWY-D) vaccine. The doses should be obtained at least 2 months apart. Microbiologists working with certain meningococcal bacteria, [employer] recruits, people at risk during an outbreak, and people who travel to or live in countries with a high rate of meningitis should be immunized. A first-year college student up through age 69 years who is living in a residence hall should receive a dose if she did not receive a dose on or after her 16th birthday. Adults who have certain high-risk conditions should receive one or more doses of vaccine.  Hepatitis A vaccine. Adults who wish to be protected from this disease, have certain high-risk conditions, work with hepatitis A-infected animals, work in hepatitis A research labs, or travel to or work in countries with a high rate of hepatitis A should be immunized. Adults who were previously unvaccinated and who anticipate close contact with an international adoptee during the first 60 days after arrival in the [place] from a country with a high rate of hepatitis A should be immunized.  Hepatitis B vaccine. Adults who wish to be protected from this disease, have certain high-risk conditions, may be exposed to blood or other infectious body fluids, are household contacts or sex partners of hepatitis B positive people, are clients or workers in certain care facilities, or travel to or work in countries with a high rate of hepatitis B should be immunized.  Haemophilus influenzae type b (Hib) vaccine. A previously unvaccinated person with asplenia or sickle cell disease or having a scheduled splenectomy should receive 1 dose of Hib vaccine. Regardless of previous immunization, a recipient of a hematopoietic stem cell  transplant should receive a 3-dose series 6-12 months after her successful transplant. Hib vaccine is not recommended for adults with HIV infection. Preventive Services / Frequency Ages 23 years and over  Blood pressure check.** / Every 1 to 2 years.  Lipid and cholesterol check.** / Every 5 years beginning at age 72 years.  Lung cancer screening. / Every year if you are aged 69-80 years and have a 30-pack-year history of smoking and currently smoke or have quit within the past 15 years. Yearly screening is stopped once you have quit smoking for at least 15 years or develop a health problem that would prevent you from having lung cancer treatment.  Clinical breast exam.** / Every year after age 14 years.  BRCA-related cancer risk assessment.** / For women who have family members with a BRCA-related cancer (breast, ovarian, tubal, or peritoneal cancers).  Mammogram.** / Every year beginning at age 17 years and continuing for as long as you are in good health. Consult with your health care provider.  Pap test.** / Every 3 years starting at age 55 years through age 55 or 3 years with 3 consecutive normal Pap tests. Testing can be stopped between 65 and 70 years with 3 consecutive normal Pap tests and no abnormal Pap or HPV tests in the past 10 years.  HPV screening.** / Every 3 years from ages 78 years through ages 46 or 55 years with a history of 3 consecutive normal Pap tests. Testing can be stopped between 65 and 70 years with 3 consecutive normal Pap tests and no abnormal Pap or HPV tests in the past 10 years.  Fecal occult blood test (FOBT) of stool. / Every year beginning at age 16 years and continuing until age 101 years. You may not need to do this test if you get a colonoscopy every 10 years.  Flexible sigmoidoscopy  or colonoscopy.** / Every 5 years for a flexible sigmoidoscopy or every 10 years for a colonoscopy beginning at age 17 years and continuing until age 17 years.  Hepatitis C  blood test.** / For all people born from [birth year] through 1965 and any individual with known risks for hepatitis C.  Osteoporosis screening.** / A one-time screening for women ages 62 years and over and women at risk for fractures or osteoporosis.  Skin self-exam. / Monthly.  Influenza vaccine. / Every year.  Tetanus, diphtheria, and acellular pertussis (Tdap/Td) vaccine.** / 1 dose of Td every 10 years.  Varicella vaccine.** / Consult your health care provider.  Zoster vaccine.** / 1 dose for adults aged 67 years or older.  Pneumococcal 13-valent conjugate (PCV13) vaccine.** / Consult your health care provider.  Pneumococcal polysaccharide (PPSV23) vaccine.** / 1 dose for all adults aged 51 years and older.  Meningococcal vaccine.** / Consult your health care provider.  Hepatitis A vaccine.** / Consult your health care provider.  Hepatitis B vaccine.** / Consult your health care provider.  Haemophilus influenzae type b (Hib) vaccine.** / Consult your health care provider. ** Family history and personal history of risk and conditions may change your health care provider's recommendations. Document Released: 06/24/2001 Document Revised: 09/12/2013 Document Reviewed: 09/23/2010 [facility] Patient Information 2015 [place], [place]. This information is not intended to replace advice given to you by your health care provider. Make sure you discuss any questions you have with your health care provider.

## 2014-07-21 NOTE — Progress Notes (Signed)
Patient ID: Kathryn Stein, female   DOB: [birth year], 69 y.o.   MRN: [id number]    Subjective:   Kathryn Stein is a 69 y.o. female who presents for an Initial Medicare Annual Wellness Visit.  Current Medications (verified) Outpatient Encounter Prescriptions as of 07/21/2014  Medication Sig  . aspirin EC 81 MG tablet Take 81 mg by mouth daily as needed. For pain  . calcium citrate-vitamin D (CITRACAL+D) 315-200 MG-UNIT per tablet Take 1 tablet by mouth 2 (two) times daily.    . citalopram (CELEXA) 40 MG tablet TAKE ONE TABLET BY MOUTH ONE TIME DAILY  . cyproheptadine (PERIACTIN) 4 MG tablet TAKE 1 TABLET (4 MG TOTAL) BY MOUTH 3 (THREE) TIMES DAILY AS NEEDED.  . hydrochlorothiazide (HYDRODIURIL) 12.5 MG tablet Take 1 tablet (12.5 mg total) by mouth daily.  . raloxifene (EVISTA) 60 MG tablet Take 1 tablet (60 mg total) by mouth daily. For bones  . simvastatin (ZOCOR) 40 MG tablet Take 1 tablet (40 mg total) by mouth at bedtime.  . Vitamin D, Ergocalciferol, (DRISDOL) 50000 UNITS CAPS capsule Take 1 capsule (50,000 Units total) by mouth every 7 (seven) days.    Allergies (verified) Codeine and Darvocet   History: Past Medical History  Diagnosis Date  . Depression   . Anorexia   . Dyspepsia   . Anemia   . Anxiety   . Arthritis   . Hypertension   . GERD (gastroesophageal reflux disease)   . Osteoporosis   . Colon polyps   . Gastritis     found on EGD  . Marital relationship problem   . Vertigo due to previous cerebellar infarction   . Postmenopausal   . Vertigo   . Hyperlipidemia    Past Surgical History  Procedure Laterality Date  . Cesarean section    . Mouth surgery      bone extraction  . Colonoscopy    . Tubal ligation    . Breast surgery Left     biopsy benign   Family History  Problem Relation Age of Onset  . Colon cancer Mother   . Diabetes Mother   . Cancer Mother   . Heart disease Mother   . Hypertension Mother   . Stroke Mother   . Cancer Brother    throat - smoker  . Kidney disease Brother   . Prostate cancer Brother   . Stomach cancer      niece  . Kidney cancer      niece  . Heart disease Sister   . Cancer Sister     breast  . Diabetes Sister   . Liver disease Brother   . Diabetes Brother   . Hypertension Brother   . Stroke Father   . Diabetes Sister   . Heart disease Sister   . Hypertension Sister   . Diabetes Sister   . Cancer Brother     prostate   Social History   Occupational History  . disability    Social History Main Topics  . Smoking status: Never Smoker   . Smokeless tobacco: Never Used  . Alcohol Use: No  . Drug Use: No  . Sexual Activity: No    Do you feel safe at home?  Yes  Current Dietary issues habits:  Breakfast - broiled egg and grits; ham and cheese omelet.  Lunch - sandwich;  Supper - meat and vegetables.  Spare ribs, salad and cornbread;   Pintos;     Objective:  Today's Vitals   07/21/14 1217  BP: 120/68  Pulse: 76  Height: 5\' 3"  (1.6 m)  Weight: 151 lb (68.493 kg)  PainSc: 0-No pain     Activities of Daily Living In your present state of health, do you have any difficulty performing the following activities: 07/21/2014  Is the patient deaf or have difficulty hearing? Y  Hearing Y  Vision N  Difficulty concentrating or making decisions N  Walking or climbing stairs? N  Doing errands, shopping? N  Preparing Food and eating ? N  Using the Toilet? N  In the past six months, have you accidently leaked urine? Y  Do you have problems with loss of bowel control? N  Managing your Medications? N  Managing your Finances? N  Housekeeping or managing your Housekeeping? N    Are there smokers in your home (other than you)? No  Hearing Difficulties: Yes Do you often ask people to speak up or repeat themselves? Yes Do you experience ringing or noises in your ears? No Do you have difficulty understanding soft or whispered voices? Yes   Depression Screen PHQ 2/9 Scores  07/21/2014 11/04/2013  PHQ - 2 Score 1 0    Fall Risk Fall Risk  07/21/2014 11/04/2013  Falls in the past year? No No   Current Exercise Habits:: Home exercise routine, Type of exercise: walking, Time (Minutes): 15, Frequency (Times/Week): 3, Weekly Exercise (Minutes/Week): 45, Intensity: Mild  Cognitive Function: MMSE - Mini Mental State Exam 07/21/2014  Orientation to time 5  Orientation to Place 5  Registration 3  Attention/ Calculation 5  Recall 3  Language- name 2 objects 2  Language- repeat 1  Language- follow 3 step command 3  Language- read & follow direction 1  Write a sentence 1  Copy design 1  Total score 30    Immunizations and Health Maintenance Immunization History  Administered Date(s) Administered  . DTaP 07/12/2010  . Influenza,inj,Quad PF,36+ Mos 04/05/2013  . Influenza-Unspecified 02/23/2012  . Pneumococcal Polysaccharide-23 12/23/2011  . Zoster 11/22/2013   Health Maintenance Due  Topic Date Due  . PNA vac Low Risk Adult (2 of 2 - PCV13) 12/22/2012  . INFLUENZA VACCINE  12/10/2013  . MAMMOGRAM  02/08/2014    Patient Care Team: Sharion Balloon, FNP as PCP - General (Nurse Practitioner)  Indicate any recent Medical Services you may have received from other than [facility] providers in the past year (date may be approximate).    Assessment:    Annual Wellness Visit  Medication Management - cyproheptadine is high risk medicaiton per [facility].  Patient is taking for anorexia / weight loss.  Her weight has been stable recently [employer] / Difficulty reading Decreased hearing in both ears.   Screening Tests Health Maintenance  Topic Date Due  . PNA vac Low Risk Adult (2 of 2 - PCV13) 12/22/2012  . INFLUENZA VACCINE  12/10/2013  . MAMMOGRAM  02/08/2014  . COLONOSCOPY  09/23/2015  . TETANUS/TDAP  07/10/2020  . DEXA SCAN  Completed  . ZOSTAVAX  Completed        Plan:   During the course of the visit Kathryn Stein was educated and counseled about  the following appropriate screening and preventive services:   Vaccines to include Pneumoccal, Influenza, Hepatitis B, Td, Zostavax, - patient given influenza and prevnar 13 vaccine today  Colorectal cancer screening - UTD  Diabetes screening - UTD  Bone Denisty / Osteoporosis Screening  Mammogram - appt made today for 10/04/14 at  10am  PAP - no longer required (last 2014)  Glaucoma screening - referral sent for eye exam  Nutrition counseling  Referral made for audiologist due to decreased hearing bilaterally  Decrease cyproheptadine 4mg  to 1 tablet bid - tid prn.  Monitor weight over next 1-2 months.  If stable then would gradually taper patient off as able.  Continue walking 3 times per week.   Limit high fat meat such as sausage and bacon due to hyperlipidemia. Continue with vegetable intake.     Patient Instructions (the written plan) were given to the patient.   Cherre Robins, [facility]   07/21/2014

## 2014-08-10 ENCOUNTER — Ambulatory Visit: Payer: Medicare Other | Admitting: Family

## 2014-09-21 ENCOUNTER — Encounter: Payer: Self-pay | Admitting: *Deleted

## 2014-09-21 ENCOUNTER — Ambulatory Visit (INDEPENDENT_AMBULATORY_CARE_PROVIDER_SITE_OTHER): Payer: Medicare Other | Admitting: *Deleted

## 2014-09-21 VITALS — BP 149/88 | HR 58 | Ht 62.75 in | Wt 147.0 lb

## 2014-09-21 DIAGNOSIS — H9193 Unspecified hearing loss, bilateral: Secondary | ICD-10-CM

## 2014-09-21 DIAGNOSIS — Z0000 Encounter for general adult medical examination without abnormal findings: Secondary | ICD-10-CM

## 2014-09-21 MED ORDER — CITALOPRAM HYDROBROMIDE 40 MG PO TABS: ORAL_TABLET | ORAL | Status: DC

## 2014-09-21 NOTE — Patient Instructions (Addendum)
Mammogram 10/04/14 at 2:30  [facility] in [place]) 10/14/14 at 9:00 Annual Physical with Evelina Dun, FNP 10/23/14 at 10:00  Try to walk for at least 30 minutes 3 times per week.  Try to eat three balanced meals per day with lean proteins, fruits, and vegetables. Have one or two healthy snacks per day as well. Cashews are a good healthy snack.  Monitor blood pressure at home and bring readings to office visit  Health Maintenance Adopting a healthy lifestyle and getting preventive care can go a long way to promote health and wellness. Talk with your health care provider about what schedule of regular examinations is right for you. This is a good chance for you to check in with your provider about disease prevention and staying healthy. In between checkups, there are plenty of things you can do on your own. Experts have done a lot of research about which lifestyle changes and preventive measures are most likely to keep you healthy. Ask your health care provider for more information. WEIGHT AND DIET  Eat a healthy diet  Be sure to include plenty of vegetables, fruits, low-fat dairy products, and lean protein.  Do not eat a lot of foods high in solid fats, added sugars, or salt.  Get regular exercise. This is one of the most important things you can do for your health.  Most adults should exercise for at least 150 minutes each week. The exercise should increase your heart rate and make you sweat (moderate-intensity exercise).  Most adults should also do strengthening exercises at least twice a week. This is in addition to the moderate-intensity exercise.  Maintain a healthy weight  Body mass index (BMI) is a measurement that can be used to identify possible weight problems. It estimates body fat based on height and weight. Your health care provider can help determine your BMI and help you achieve or maintain a healthy weight.  For females 4 years of age and  older:   A BMI below 18.5 is considered underweight.  A BMI of 18.5 to 24.9 is normal.  A BMI of 25 to 29.9 is considered overweight.  A BMI of 30 and above is considered obese.  Watch levels of cholesterol and blood lipids  You should start having your blood tested for lipids and cholesterol at 69 years of age, then have this test every 5 years.  You may need to have your cholesterol levels checked more often if:  Your lipid or cholesterol levels are high.  You are older than 69 years of age.  You are at high risk for heart disease.  CANCER SCREENING   Lung Cancer  Lung cancer screening is recommended for adults 39-46 years old who are at high risk for lung cancer because of a history of smoking.  A yearly low-dose CT scan of the lungs is recommended for people who:  Currently smoke.  Have quit within the past 15 years.  Have at least a 30-pack-year history of smoking. A pack year is smoking an average of one pack of cigarettes a day for 1 year.  Yearly screening should continue until it has been 15 years since you quit.  Yearly screening should stop if you develop a health problem that would prevent you from having lung cancer treatment.  Breast Cancer  Practice breast self-awareness. This means understanding how your breasts normally appear and feel.  It also means doing regular breast self-exams. Let your health care provider  know about any changes, no matter how small.  If you are in your 20s or 30s, you should have a clinical breast exam (CBE) by a health care provider every 1-3 years as part of a regular health exam.  If you are 37 or older, have a CBE every year. Also consider having a breast X-ray (mammogram) every year.  If you have a family history of breast cancer, talk to your health care provider about genetic screening.  If you are at high risk for breast cancer, talk to your health care provider about having an MRI and a mammogram every  year.  Breast cancer gene (BRCA) assessment is recommended for women who have family members with BRCA-related cancers. BRCA-related cancers include:  Breast.  Ovarian.  Tubal.  Peritoneal cancers.  Results of the assessment will determine the need for genetic counseling and BRCA1 and BRCA2 testing. Cervical Cancer Routine pelvic examinations to screen for cervical cancer are no longer recommended for nonpregnant women who are considered low risk for cancer of the pelvic organs (ovaries, uterus, and vagina) and who do not have symptoms. A pelvic examination may be necessary if you have symptoms including those associated with pelvic infections. Ask your health care provider if a screening pelvic exam is right for you.   The Pap test is the screening test for cervical cancer for women who are considered at risk.  If you had a hysterectomy for a problem that was not cancer or a condition that could lead to cancer, then you no longer need Pap tests.  If you are older than 65 years, and you have had normal Pap tests for the past 10 years, you no longer need to have Pap tests.  If you have had past treatment for cervical cancer or a condition that could lead to cancer, you need Pap tests and screening for cancer for at least 20 years after your treatment.  If you no longer get a Pap test, assess your risk factors if they change (such as having a new sexual partner). This can affect whether you should start being screened again.  Some women have medical problems that increase their chance of getting cervical cancer. If this is the case for you, your health care provider may recommend more frequent screening and Pap tests.  The human papillomavirus (HPV) test is another test that may be used for cervical cancer screening. The HPV test looks for the virus that can cause cell changes in the cervix. The cells collected during the Pap test can be tested for HPV.  The HPV test can be used to screen  women 8 years of age and older. Getting tested for HPV can extend the interval between normal Pap tests from three to five years.  An HPV test also should be used to screen women of any age who have unclear Pap test results.  After 69 years of age, women should have HPV testing as often as Pap tests.  Colorectal Cancer  This type of cancer can be detected and often prevented.  Routine colorectal cancer screening usually begins at 69 years of age and continues through 69 years of age.  Your health care provider may recommend screening at an earlier age if you have risk factors for colon cancer.  Your health care provider may also recommend using home test kits to check for hidden blood in the stool.  A small camera at the end of a tube can be used to examine your colon directly (  sigmoidoscopy or colonoscopy). This is done to check for the earliest forms of colorectal cancer.  Routine screening usually begins at age 25.  Direct examination of the colon should be repeated every 5-10 years through 69 years of age. However, you may need to be screened more often if early forms of precancerous polyps or small growths are found. Skin Cancer  Check your skin from head to toe regularly.  Tell your health care provider about any new moles or changes in moles, especially if there is a change in a mole's shape or color.  Also tell your health care provider if you have a mole that is larger than the size of a pencil eraser.  Always use sunscreen. Apply sunscreen liberally and repeatedly throughout the day.  Protect yourself by wearing long sleeves, pants, a wide-brimmed hat, and sunglasses whenever you are outside. HEART DISEASE, DIABETES, AND HIGH BLOOD PRESSURE   Have your blood pressure checked at least every 1-2 years. High blood pressure causes heart disease and increases the risk of stroke.  If you are between 35 years and 67 years old, ask your health care provider if you should take  aspirin to prevent strokes.  Have regular diabetes screenings. This involves taking a blood sample to check your fasting blood sugar level.  If you are at a normal weight and have a low risk for diabetes, have this test once every three years after 69 years of age.  If you are overweight and have a high risk for diabetes, consider being tested at a younger age or more often. PREVENTING INFECTION  Hepatitis B  If you have a higher risk for hepatitis B, you should be screened for this virus. You are considered at high risk for hepatitis B if:  You were born in a country where hepatitis B is common. Ask your health care provider which countries are considered high risk.  Your parents were born in a high-risk country, and you have not been immunized against hepatitis B (hepatitis B vaccine).  You have HIV or AIDS.  You use needles to inject street drugs.  You live with someone who has hepatitis B.  You have had sex with someone who has hepatitis B.  You get hemodialysis treatment.  You take certain medicines for conditions, including cancer, organ transplantation, and autoimmune conditions. Hepatitis C  Blood testing is recommended for:  Everyone born from [birth year] through 1965.  Anyone with known risk factors for hepatitis C. Sexually transmitted infections (STIs)  You should be screened for sexually transmitted infections (STIs) including gonorrhea and chlamydia if:  You are sexually active and are younger than 69 years of age.  You are older than 69 years of age and your health care provider tells you that you are at risk for this type of infection.  Your sexual activity has changed since you were last screened and you are at an increased risk for chlamydia or gonorrhea. Ask your health care provider if you are at risk.  If you do not have HIV, but are at risk, it may be recommended that you take a prescription medicine daily to prevent HIV infection. This is called  pre-exposure prophylaxis (PrEP). You are considered at risk if:  You are sexually active and do not regularly use condoms or know the HIV status of your partner(s).  You take drugs by injection.  You are sexually active with a partner who has HIV. Talk with your health care provider about whether you are at  high risk of being infected with HIV. If you choose to begin PrEP, you should first be tested for HIV. You should then be tested every 3 months for as long as you are taking PrEP.  PREGNANCY   If you are premenopausal and you may become pregnant, ask your health care provider about preconception counseling.  If you may become pregnant, take 400 to 800 micrograms (mcg) of folic acid every day.  If you want to prevent pregnancy, talk to your health care provider about birth control (contraception). OSTEOPOROSIS AND MENOPAUSE   Osteoporosis is a disease in which the bones lose minerals and strength with aging. This can result in serious bone fractures. Your risk for osteoporosis can be identified using a bone density scan.  If you are 21 years of age or older, or if you are at risk for osteoporosis and fractures, ask your health care provider if you should be screened.  Ask your health care provider whether you should take a calcium or vitamin D supplement to lower your risk for osteoporosis.  Menopause may have certain physical symptoms and risks.  Hormone replacement therapy may reduce some of these symptoms and risks. Talk to your health care provider about whether hormone replacement therapy is right for you.  HOME CARE INSTRUCTIONS   Schedule regular health, dental, and eye exams.  Stay current with your immunizations.   Do not use any tobacco products including cigarettes, chewing tobacco, or electronic cigarettes.  If you are pregnant, do not drink alcohol.  If you are breastfeeding, limit how much and how often you drink alcohol.  Limit alcohol intake to no more than 1  drink per day for nonpregnant women. One drink equals 12 ounces of beer, 5 ounces of wine, or 1 ounces of hard liquor.  Do not use street drugs.  Do not share needles.  Ask your health care provider for help if you need support or information about quitting drugs.  Tell your health care provider if you often feel depressed.  Tell your health care provider if you have ever been abused or do not feel safe at home. Document Released: 11/11/2010 Document Revised: 09/12/2013 Document Reviewed: 03/30/2013 [facility] Patient Information 2015 [place], [place]. This information is not intended to replace advice given to you by your health care provider. Make sure you discuss any questions you have with your health care provider.

## 2014-09-21 NOTE — Progress Notes (Signed)
Patient ID: Kathryn Stein, female   DOB: 1946-01-15, 70 y.o.   MRN: [id number]   Subjective:   Kathryn Stein is a 69 y.o. female who presents for an Initial Medicare Annual Wellness Visit. Ms. Kathryn Stein lives at home with her husband. She has 5 children. She is a retired [employer] forces logistics/support/administrative officer. Her education level is up to 6th grade. She dropped out of school and reports that as an adult she was diagnosed with a learning disability. She has not been taking her Evista, Celexa, or Periactin due to cost.   Review of Systems     Cardiac Risk Factors include: advanced age (>88men, >35 women);dyslipidemia;hypertension;sedentary lifestyle  GI-constipation and decreased appetite. She has Periactin to stimulate appetite.  Urinary-Some stress incontinence with severe coughing Hearing-decreased hearing bilaterally Mental Health-Depression and anxiety  All other systems negative     Objective:    Today's Vitals   09/21/14 0901  BP: 149/88  Pulse: 58  Height: 5' 2.75" (1.594 m)  Weight: 147 lb (66.679 kg)  BMI     26.25   Current Medications (verified) Outpatient Encounter Prescriptions as of 09/21/2014  Medication Sig  . aspirin EC 81 MG tablet Take 81 mg by mouth daily as needed. For pain  . cyproheptadine (PERIACTIN) 4 MG tablet Take 4 mg by mouth 3 (three) times daily as needed for allergies.  . hydrochlorothiazide (HYDRODIURIL) 12.5 MG tablet Take 1 tablet (12.5 mg total) by mouth daily.  . meclizine (ANTIVERT) 25 MG tablet Take 25 mg by mouth 3 (three) times daily as needed for dizziness.  . Omega-3 Fatty Acids (FISH OIL PO) Take by mouth.  . simvastatin (ZOCOR) 40 MG tablet Take 1 tablet (40 mg total) by mouth at bedtime.  . Vitamin D, Ergocalciferol, (DRISDOL) 50000 UNITS CAPS capsule Take 1 capsule (50,000 Units total) by mouth every 7 (seven) days.  . [DISCONTINUED] cyproheptadine (PERIACTIN) 4 MG tablet TAKE 1 TABLET (4 MG TOTAL) BY MOUTH 2 (two) to 3 (THREE) TIMES DAILY AS NEEDED.  . citalopram  (CELEXA) 40 MG tablet TAKE ONE TABLET BY MOUTH ONE TIME DAILY  . raloxifene (EVISTA) 60 MG tablet Take 1 tablet (60 mg total) by mouth daily. For bones (Patient not taking: Reported on 09/21/2014)  . [DISCONTINUED] calcium citrate-vitamin D (CITRACAL+D) 315-200 MG-UNIT per tablet Take 1 tablet by mouth 2 (two) times daily.    . [DISCONTINUED] citalopram (CELEXA) 40 MG tablet TAKE ONE TABLET BY MOUTH ONE TIME DAILY (Patient not taking: Reported on 09/21/2014)   No facility-administered encounter medications on file as of 09/21/2014.    Allergies (verified) Codeine and Darvocet -GI upset  History: Past Medical History  Diagnosis Date  . Depression   . Anorexia   . Dyspepsia   . Anemia   . Anxiety   . Arthritis   . Hypertension   . GERD (gastroesophageal reflux disease)   . Osteoporosis   . Colon polyps   . Gastritis     found on EGD  . Marital relationship problem   . Vertigo due to previous cerebellar infarction   . Postmenopausal   . Vertigo   . Hyperlipidemia    Past Surgical History  Procedure Laterality Date  . Cesarean section    . Mouth surgery      bone extraction  . Colonoscopy    . Tubal ligation    . Breast surgery Left     biopsy benign   Family History  Problem Relation Age of Onset  . Colon  cancer Mother   . Diabetes Mother   . Cancer Mother   . Heart disease Mother   . Hypertension Mother   . Stroke Mother   . Cancer Brother     throat - smoker  . Kidney disease Brother   . Prostate cancer Brother   . Stomach cancer      niece  . Kidney cancer      niece  . Heart disease Sister   . Cancer Sister     breast  . Diabetes Sister   . Liver disease Brother   . Diabetes Brother   . Hypertension Brother   . Stroke Father   . Diabetes Sister   . Heart disease Sister   . Hypertension Sister   . Diabetes Sister   . Cancer Brother     prostate   Social History   Occupational History  . disability    Social History Main Topics  . Smoking  status: Never Smoker   . Smokeless tobacco: Never Used  . Alcohol Use: No  . Drug Use: No  . Sexual Activity: No    Activities of Daily Living In your present state of health, do you have any difficulty performing the following activities: 09/21/2014 07/21/2014  Hearing? Kathryn Stein  Vision? Y Y  Difficulty concentrating or making decisions? Y N  Walking or climbing stairs? N N  Dressing or bathing? N N  Doing errands, shopping? N N  Preparing Food and eating ? N N  Using the Toilet? N N  In the past six months, have you accidently leaked urine? Y Y  Do you have problems with loss of bowel control? N N  Managing your Medications? N N  Managing your Finances? N N  Housekeeping or managing your Housekeeping? N N    Immunizations and Health Maintenance Immunization History  Administered Date(s) Administered  . DTaP 07/12/2010  . Influenza,inj,Quad PF,36+ Mos 04/05/2013  . Influenza-Unspecified 02/23/2012  . Pneumococcal Conjugate-13 07/21/2014  . Pneumococcal Polysaccharide-23 12/23/2011  . Tdap 07/11/2010  . Zoster 11/22/2013   Health Maintenance Due  Topic Date Due  . MAMMOGRAM  02/08/2014    Patient Care Team: Sharion Balloon, FNP as PCP - General (Nurse Practitioner) Milus Banister, MD as Attending Physician (Gastroenterology)     Assessment:   This is a routine wellness examination for Kathryn Stein.   Hearing/Vision screen No formal exams done. Patient did have to squint to read. No hearing deficit noted although she reports some difficulty hearing.  Dietary issues and exercise activities discussed: Current Exercise Habits:: The patient does not participate in regular exercise at present  Goals    . Exercise 150 minutes per week (moderate activity)     Walk for at least 30 minutes 3 times per week    . Have 3 meals a day     Try to eat a balanced meal with lean proteins, fruits, and vegetables 3 times a day with a small healthy snack once or twice a day       Depression Screen PHQ 2/9 Scores 09/21/2014 07/21/2014 11/04/2013  PHQ - 2 Score 4 1 0  PHQ- 9 Score 10 - -   Patient felt much better while taking Celexa. She has been off of this for a few months due to cost. She has seen mental health in the past and found the counseling helpful. She currently talks with her sisters about her troubles at home and finds this cathartic. She states that her  husband's mood greatly affects her mood. If he isn't happy then he is very critical and hard to please. This contributes to her depression and anxiety.    Fall Risk Fall Risk  09/21/2014 07/21/2014 11/04/2013  Falls in the past year? Yes No No  Number falls in past yr: 1 - -  Injury with Fall? No - -   She did sustain one fall recently because of her 53 year old grandson. There was no injury. She does not have any throw rugs or indoor pets. She does have one dog outside but she is careful around him.   Cognitive Function: MMSE - Mini Mental State Exam 09/21/2014 07/21/2014  Orientation to time 5 5  Orientation to Place 5 5  Registration 3 3  Attention/ Calculation 5 5  Recall 2 3  Language- name 2 objects 2 2  Language- repeat 1 1  Language- follow 3 step command 3 3  Language- read & follow direction 1 1  Write a sentence 1 1  Copy design 1 1  Total score - 30  Total score today                               29  Screening Tests Health Maintenance  Topic Date Due  . MAMMOGRAM  02/08/2014  . INFLUENZA VACCINE  12/11/2014  . DEXA SCAN  09/02/2015  . COLONOSCOPY  09/23/2015  . TETANUS/TDAP  07/10/2020  . ZOSTAVAX  Completed  . PNA vac Low Risk Adult  Completed      Plan:      -Mammogram 10/04/14 at 2:30  -[facility] in [place]) 10/14/14 at 9:00 -Annual Physical with Evelina Dun, [place] 10/23/14 at 10:00 -Audiology referral placed. -Celexa refill sent to [facility] where she can buy it for $4 a month. Since she has been off of it for a few months advised  to take 1/2 tablet for first week then increase to whole tablet.  Try to walk for at least 30 minutes 3 times per week.  Try to eat three balanced meals per day with lean proteins, fruits, and vegetables. Have one or two healthy snacks per day as well. Cashews are a good healthy snack.  Monitor blood pressure at home and bring readings to office visit.  An increase in exercise, fiber, and water intake will help constipation.   Will check on cost of Evista and Periactin at [facility]  During the course of the visit, Rozetta was educated and counseled about the following appropriate screening and preventive services:   Vaccines to include Pneumococal-complete, Influenza-needed yearly, Tdap-up to date, Zostavax-complete  Electrocardiogram-Normal 01/2012. May repeat at physical exam if needed.  Cardiovascular disease screening-Lipids screened at routine visits  Colorectal cancer screening-Colonoscopy due 2017  Bone density screening-Due 2017  Diabetes screening-Glucose screening at routine visits  Glaucoma screening-Eye exam scheduled for 10/14/14. Have them send a copy of report to our office  Mammography-Scheduled for 10/04/14 at 2:30  Nutrition counseling-3 balanced meals a day with 2 small snacks  Exercise-Walk 3 times per week for 30 minutes  Patient Instructions (the written plan) were given to the patient.    Chong Sicilian, RN   09/21/2014      I have reviewed and agree with the above AWV documentation.  Claretta Fraise, M.D.

## 2014-10-23 ENCOUNTER — Encounter: Payer: Medicare Other | Admitting: Family

## 2015-02-15 ENCOUNTER — Ambulatory Visit (INDEPENDENT_AMBULATORY_CARE_PROVIDER_SITE_OTHER): Payer: Medicare Other

## 2015-02-15 DIAGNOSIS — Z23 Encounter for immunization: Secondary | ICD-10-CM | POA: Diagnosis not present

## 2015-02-26 ENCOUNTER — Telehealth: Payer: Self-pay | Admitting: Family

## 2015-04-13 ENCOUNTER — Other Ambulatory Visit: Payer: Self-pay | Admitting: Family Medicine

## 2015-04-13 ENCOUNTER — Other Ambulatory Visit: Payer: Self-pay | Admitting: Family

## 2015-04-16 ENCOUNTER — Encounter: Payer: Self-pay | Admitting: *Deleted

## 2015-04-16 NOTE — Telephone Encounter (Signed)
Last seen 11/22/13   [facility]

## 2015-04-16 NOTE — Telephone Encounter (Signed)
Last seen 11/22/13 Kathryn Stein  Last Vit D 11/04/13  27.4

## 2015-04-16 NOTE — Telephone Encounter (Signed)
Pt needs follow up appt. No other refills until seen

## 2015-04-17 ENCOUNTER — Encounter: Payer: Self-pay | Admitting: Family

## 2015-04-17 ENCOUNTER — Ambulatory Visit (INDEPENDENT_AMBULATORY_CARE_PROVIDER_SITE_OTHER): Payer: Medicare Other | Admitting: Family

## 2015-04-17 VITALS — BP 168/95 | HR 73 | Temp 97.5°F | Ht 63.0 in | Wt 148.4 lb

## 2015-04-17 DIAGNOSIS — J069 Acute upper respiratory infection, unspecified: Secondary | ICD-10-CM | POA: Diagnosis not present

## 2015-04-17 DIAGNOSIS — J029 Acute pharyngitis, unspecified: Secondary | ICD-10-CM

## 2015-04-17 DIAGNOSIS — I10 Essential (primary) hypertension: Secondary | ICD-10-CM

## 2015-04-17 LAB — POCT RAPID STREP A (OFFICE): Rapid Strep A Screen: NEGATIVE

## 2015-04-17 MED ORDER — HYDROCHLOROTHIAZIDE 12.5 MG PO TABS: 12.5000 mg | ORAL_TABLET | Freq: Every day | ORAL | Status: DC

## 2015-04-17 MED ORDER — CITALOPRAM HYDROBROMIDE 40 MG PO TABS: ORAL_TABLET | ORAL | Status: DC

## 2015-04-17 MED ORDER — FLUTICASONE PROPIONATE 50 MCG/ACT NA SUSP: 2.0000 | Freq: Every day | NASAL | Status: DC

## 2015-04-17 MED ORDER — SIMVASTATIN 40 MG PO TABS: 40.0000 mg | ORAL_TABLET | Freq: Every day | ORAL | Status: DC

## 2015-04-17 MED ORDER — RALOXIFENE HCL 60 MG PO TABS: ORAL_TABLET | ORAL | Status: DC

## 2015-04-17 NOTE — Patient Instructions (Signed)
Upper Respiratory Infection, Adult Most upper respiratory infections (URIs) are a viral infection of the air passages leading to the lungs. A URI affects the nose, throat, and upper air passages. The most common type of URI is nasopharyngitis and is typically referred to as "the common cold." URIs run their course and usually go away on their own. Most of the time, a URI does not require medical attention, but sometimes a bacterial infection in the upper airways can follow a viral infection. This is called a secondary infection. Sinus and middle ear infections are common types of secondary upper respiratory infections. Bacterial pneumonia can also complicate a URI. A URI can worsen asthma and chronic obstructive pulmonary disease (COPD). Sometimes, these complications can require emergency medical care and may be life threatening.  CAUSES Almost all URIs are caused by viruses. A virus is a type of germ and can spread from one person to another.  RISKS FACTORS You may be at risk for a URI if:   You smoke.   You have chronic heart or lung disease.  You have a weakened defense (immune) system.   You are very young or very old.   You have nasal allergies or asthma.  You work in crowded or poorly ventilated areas.  You work in health care facilities or schools. SIGNS AND SYMPTOMS  Symptoms typically develop 2-3 days after you come in contact with a cold virus. Most viral URIs last 7-10 days. However, viral URIs from the influenza virus (flu virus) can last 14-18 days and are typically more severe. Symptoms may include:   Runny or stuffy (congested) nose.   Sneezing.   Cough.   Sore throat.   Headache.   Fatigue.   Fever.   Loss of appetite.   Pain in your forehead, behind your eyes, and over your cheekbones (sinus pain).  Muscle aches.  DIAGNOSIS  Your health care provider may diagnose a URI by:  Physical exam.  Tests to check that your symptoms are not due to  another condition such as:  Strep throat.  Sinusitis.  Pneumonia.  Asthma. TREATMENT  A URI goes away on its own with time. It cannot be cured with medicines, but medicines may be prescribed or recommended to relieve symptoms. Medicines may help:  Reduce your fever.  Reduce your cough.  Relieve nasal congestion. HOME CARE INSTRUCTIONS   Take medicines only as directed by your health care provider.   Gargle warm saltwater or take cough drops to comfort your throat as directed by your health care provider.  Use a warm mist humidifier or inhale steam from a shower to increase air moisture. This may make it easier to breathe.  Drink enough fluid to keep your urine clear or pale yellow.   Eat soups and other clear broths and maintain good nutrition.   Rest as needed.   Return to work when your temperature has returned to normal or as your health care provider advises. You may need to stay home longer to avoid infecting others. You can also use a face mask and careful hand washing to prevent spread of the virus.  Increase the usage of your inhaler if you have asthma.   Do not use any tobacco products, including cigarettes, chewing tobacco, or electronic cigarettes. If you need help quitting, ask your health care provider. PREVENTION  The best way to protect yourself from getting a cold is to practice good hygiene.   Avoid oral or hand contact with people with cold   symptoms.   Wash your hands often if contact occurs.  There is no clear evidence that vitamin C, vitamin E, echinacea, or exercise reduces the chance of developing a cold. However, it is always recommended to get plenty of rest, exercise, and practice good nutrition.  SEEK MEDICAL CARE IF:   You are getting worse rather than better.   Your symptoms are not controlled by medicine.   You have chills.  You have worsening shortness of breath.  You have brown or red mucus.  You have yellow or brown nasal  discharge.  You have pain in your face, especially when you bend forward.  You have a fever.  You have swollen neck glands.  You have pain while swallowing.  You have white areas in the back of your throat. SEEK IMMEDIATE MEDICAL CARE IF:   You have severe or persistent:  Headache.  Ear pain.  Sinus pain.  Chest pain.  You have chronic lung disease and any of the following:  Wheezing.  Prolonged cough.  Coughing up blood.  A change in your usual mucus.  You have a stiff neck.  You have changes in your:  Vision.  Hearing.  Thinking.  Mood. MAKE SURE YOU:   Understand these instructions.  Will watch your condition.  Will get help right away if you are not doing well or get worse.   This information is not intended to replace advice given to you by your health care provider. Make sure you discuss any questions you have with your health care provider.   Document Released: 10/22/2000 Document Revised: 09/12/2014 Document Reviewed: 08/03/2013 Elsevier Interactive Patient Education 2016 [employer]  - Take meds as prescribed - Use a cool mist humidifier  -Use saline nose sprays frequently -Saline irrigations of the nose can be very helpful if done frequently.  * 4X daily for 1 week*  * Use of a nettie pot can be helpful with this. Follow directions with this* -Force fluids -For any cough or congestion  Use plain Mucinex- regular strength or max strength is fine   * Children- consult with Pharmacist for dosing -For fever or aces or pains- take tylenol or ibuprofen appropriate for age and weight.  * for fevers greater than 101 orally you may alternate ibuprofen and tylenol every  3 hours. -Throat lozenges if help -New toothbrush in 3 days   Willow Shidler, FNP  

## 2015-04-17 NOTE — Progress Notes (Signed)
Subjective:    Patient ID: Kathryn Stein, female    DOB: 02/15/1946, 69 y.o.   MRN: [id number]  Sore Throat  This is a new problem. The current episode started in the past 7 days. The problem has been waxing and waning. There has been no fever. The pain is at a severity of 8/10. The pain is moderate. Associated symptoms include coughing ("every now and then"), ear pain, a hoarse voice and swollen glands. Pertinent negatives include no congestion, diarrhea, ear discharge, headaches, plugged ear sensation, shortness of breath or trouble swallowing. She has had no exposure to strep or mono. Treatments tried: lemon and honey. The treatment provided mild relief.      Review of Systems  Constitutional: Negative.   HENT: Positive for ear pain and hoarse voice. Negative for congestion, ear discharge and trouble swallowing.   Eyes: Negative.   Respiratory: Positive for cough ("every now and then"). Negative for shortness of breath.   Cardiovascular: Negative.  Negative for palpitations.  Gastrointestinal: Negative.  Negative for diarrhea.  Endocrine: Negative.   Genitourinary: Negative.   Musculoskeletal: Negative.   Neurological: Negative.  Negative for headaches.  Hematological: Negative.   Psychiatric/Behavioral: Negative.   All other systems reviewed and are negative.      Objective:   Physical Exam  Constitutional: She is oriented to person, place, and time. She appears well-developed and well-nourished. No distress.  HENT:  Head: Normocephalic and atraumatic.  Right Ear: External ear normal.  Left Ear: External ear normal.  Nasal passage erythemas with mild swelling  Oropharynx erythemas   Eyes: Pupils are equal, round, and reactive to light.  Neck: Normal range of motion. Neck supple. No thyromegaly present.  Cardiovascular: Normal rate, regular rhythm, normal heart sounds and intact distal pulses.   No murmur heard. Pulmonary/Chest: Effort normal and breath sounds normal.  No respiratory distress. She has no wheezes.  Abdominal: Soft. Bowel sounds are normal. She exhibits no distension. There is no tenderness.  Musculoskeletal: Normal range of motion. She exhibits no edema or tenderness.  Neurological: She is alert and oriented to person, place, and time. She has normal reflexes. No cranial nerve deficit.  Skin: Skin is warm and dry.  Psychiatric: She has a normal mood and affect. Her behavior is normal. Judgment and thought content normal.  Vitals reviewed.     BP 168/95 mmHg  Pulse 73  Temp(Src) 97.5 F (36.4 C) (Oral)  Ht 5\' 3"  (1.6 m)  Wt 148 lb 6.4 oz (67.314 kg)  BMI 26.29 kg/m2     Assessment & Plan:  1. Essential hypertension, benign - hydrochlorothiazide (HYDRODIURIL) 12.5 MG tablet; Take 1 tablet (12.5 mg total) by mouth daily.  Dispense: 30 tablet; Refill: 0  2. Sore throat - POCT rapid strep A  3. Viral upper respiratory infection -- Take meds as prescribed - Use a cool mist humidifier  -Use saline nose sprays frequently -Saline irrigations of the nose can be very helpful if done frequently.  * 4X daily for 1 week*  * Use of a nettie pot can be helpful with this. Follow directions with this* -Force fluids -For any cough or congestion  Use plain Mucinex- regular strength or max strength is fine   * Children- consult with Pharmacist for dosing -For fever or aces or pains- take tylenol or ibuprofen appropriate for age and weight.  * for fevers greater than 101 orally you may alternate ibuprofen and tylenol every  3 hours. -Throat lozenges if help -  New toothbrush in 3 days - fluticasone (FLONASE) 50 MCG/ACT nasal spray; Place 2 sprays into both nostrils daily.  Dispense: 16 g; Refill: 6   Pt's medications refilled for 1 month. Pt told she needed to make appt for chronic follow up and lab work. I will not refill medications again until she is seen.  Evelina Dun, FNP

## 2015-05-01 ENCOUNTER — Ambulatory Visit (INDEPENDENT_AMBULATORY_CARE_PROVIDER_SITE_OTHER): Payer: Medicare Other | Admitting: Family

## 2015-05-01 ENCOUNTER — Encounter: Payer: Self-pay | Admitting: Family

## 2015-05-01 VITALS — BP 121/83 | HR 68 | Temp 97.1°F | Ht 63.0 in | Wt 147.8 lb

## 2015-05-01 DIAGNOSIS — I10 Essential (primary) hypertension: Secondary | ICD-10-CM

## 2015-05-01 DIAGNOSIS — E559 Vitamin D deficiency, unspecified: Secondary | ICD-10-CM | POA: Diagnosis not present

## 2015-05-01 DIAGNOSIS — F32A Depression, unspecified: Secondary | ICD-10-CM

## 2015-05-01 DIAGNOSIS — F329 Major depressive disorder, single episode, unspecified: Secondary | ICD-10-CM

## 2015-05-01 DIAGNOSIS — Z1159 Encounter for screening for other viral diseases: Secondary | ICD-10-CM | POA: Diagnosis not present

## 2015-05-01 DIAGNOSIS — M810 Age-related osteoporosis without current pathological fracture: Secondary | ICD-10-CM

## 2015-05-01 DIAGNOSIS — E785 Hyperlipidemia, unspecified: Secondary | ICD-10-CM

## 2015-05-01 NOTE — Patient Instructions (Signed)
Health Maintenance, Female Adopting a healthy lifestyle and getting preventive care can go a long way to promote health and wellness. Talk with your health care provider about what schedule of regular examinations is right for you. This is a good chance for you to check in with your provider about disease prevention and staying healthy. In between checkups, there are plenty of things you can do on your own. Experts have done a lot of research about which lifestyle changes and preventive measures are most likely to keep you healthy. Ask your health care provider for more information. WEIGHT AND DIET  Eat a healthy diet  Be sure to include plenty of vegetables, fruits, low-fat dairy products, and lean protein.  Do not eat a lot of foods high in solid fats, added sugars, or salt.  Get regular exercise. This is one of the most important things you can do for your health.  Most adults should exercise for at least 150 minutes each week. The exercise should increase your heart rate and make you sweat (moderate-intensity exercise).  Most adults should also do strengthening exercises at least twice a week. This is in addition to the moderate-intensity exercise.  Maintain a healthy weight  Body mass index (BMI) is a measurement that can be used to identify possible weight problems. It estimates body fat based on height and weight. Your health care provider can help determine your BMI and help you achieve or maintain a healthy weight.  For females 20 years of age and older:   A BMI below 18.5 is considered underweight.  A BMI of 18.5 to 24.9 is normal.  A BMI of 25 to 29.9 is considered overweight.  A BMI of 30 and above is considered obese.  Watch levels of cholesterol and blood lipids  You should start having your blood tested for lipids and cholesterol at 69 years of age, then have this test every 5 years.  You may need to have your cholesterol levels checked more often if:  Your lipid  or cholesterol levels are high.  You are older than 69 years of age.  You are at high risk for heart disease.  CANCER SCREENING   Lung Cancer  Lung cancer screening is recommended for adults 55-80 years old who are at high risk for lung cancer because of a history of smoking.  A yearly low-dose CT scan of the lungs is recommended for people who:  Currently smoke.  Have quit within the past 15 years.  Have at least a 30-pack-year history of smoking. A pack year is smoking an average of one pack of cigarettes a day for 1 year.  Yearly screening should continue until it has been 15 years since you quit.  Yearly screening should stop if you develop a health problem that would prevent you from having lung cancer treatment.  Breast Cancer  Practice breast self-awareness. This means understanding how your breasts normally appear and feel.  It also means doing regular breast self-exams. Let your health care provider know about any changes, no matter how small.  If you are in your 20s or 30s, you should have a clinical breast exam (CBE) by a health care provider every 1-3 years as part of a regular health exam.  If you are 40 or older, have a CBE every year. Also consider having a breast X-ray (mammogram) every year.  If you have a family history of breast cancer, talk to your health care provider about genetic screening.  If you   are at high risk for breast cancer, talk to your health care provider about having an MRI and a mammogram every year.  Breast cancer gene (BRCA) assessment is recommended for women who have family members with BRCA-related cancers. BRCA-related cancers include:  Breast.  Ovarian.  Tubal.  Peritoneal cancers.  Results of the assessment will determine the need for genetic counseling and BRCA1 and BRCA2 testing. Cervical Cancer Your health care provider may recommend that you be screened regularly for cancer of the pelvic organs (ovaries, uterus, and  vagina). This screening involves a pelvic examination, including checking for microscopic changes to the surface of your cervix (Pap test). You may be encouraged to have this screening done every 3 years, beginning at age 21.  For women ages 30-65, health care providers may recommend pelvic exams and Pap testing every 3 years, or they may recommend the Pap and pelvic exam, combined with testing for human papilloma virus (HPV), every 5 years. Some types of HPV increase your risk of cervical cancer. Testing for HPV may also be done on women of any age with unclear Pap test results.  Other health care providers may not recommend any screening for nonpregnant women who are considered low risk for pelvic cancer and who do not have symptoms. Ask your health care provider if a screening pelvic exam is right for you.  If you have had past treatment for cervical cancer or a condition that could lead to cancer, you need Pap tests and screening for cancer for at least 20 years after your treatment. If Pap tests have been discontinued, your risk factors (such as having a new sexual partner) need to be reassessed to determine if screening should resume. Some women have medical problems that increase the chance of getting cervical cancer. In these cases, your health care provider may recommend more frequent screening and Pap tests. Colorectal Cancer  This type of cancer can be detected and often prevented.  Routine colorectal cancer screening usually begins at 69 years of age and continues through 69 years of age.  Your health care provider may recommend screening at an earlier age if you have risk factors for colon cancer.  Your health care provider may also recommend using home test kits to check for hidden blood in the stool.  A small camera at the end of a tube can be used to examine your colon directly (sigmoidoscopy or colonoscopy). This is done to check for the earliest forms of colorectal  cancer.  Routine screening usually begins at age 50.  Direct examination of the colon should be repeated every 5-10 years through 69 years of age. However, you may need to be screened more often if early forms of precancerous polyps or small growths are found. Skin Cancer  Check your skin from head to toe regularly.  Tell your health care provider about any new moles or changes in moles, especially if there is a change in a mole's shape or color.  Also tell your health care provider if you have a mole that is larger than the size of a pencil eraser.  Always use sunscreen. Apply sunscreen liberally and repeatedly throughout the day.  Protect yourself by wearing long sleeves, pants, a wide-brimmed hat, and sunglasses whenever you are outside. HEART DISEASE, DIABETES, AND HIGH BLOOD PRESSURE   High blood pressure causes heart disease and increases the risk of stroke. High blood pressure is more likely to develop in:  People who have blood pressure in the high end   of the normal range (130-139/85-89 mm Hg).  People who are overweight or obese.  People who are African American.  If you are 38-23 years of age, have your blood pressure checked every 3-5 years. If you are 61 years of age or older, have your blood pressure checked every year. You should have your blood pressure measured twice--once when you are at a hospital or clinic, and once when you are not at a hospital or clinic. Record the average of the two measurements. To check your blood pressure when you are not at a hospital or clinic, you can use:  An automated blood pressure machine at a pharmacy.  A home blood pressure monitor.  If you are between 45 years and 39 years old, ask your health care provider if you should take aspirin to prevent strokes.  Have regular diabetes screenings. This involves taking a blood sample to check your fasting blood sugar level.  If you are at a normal weight and have a low risk for diabetes,  have this test once every three years after 68 years of age.  If you are overweight and have a high risk for diabetes, consider being tested at a younger age or more often. PREVENTING INFECTION  Hepatitis B  If you have a higher risk for hepatitis B, you should be screened for this virus. You are considered at high risk for hepatitis B if:  You were born in a country where hepatitis B is common. Ask your health care provider which countries are considered high risk.  Your parents were born in a high-risk country, and you have not been immunized against hepatitis B (hepatitis B vaccine).  You have HIV or AIDS.  You use needles to inject street drugs.  You live with someone who has hepatitis B.  You have had sex with someone who has hepatitis B.  You get hemodialysis treatment.  You take certain medicines for conditions, including cancer, organ transplantation, and autoimmune conditions. Hepatitis C  Blood testing is recommended for:  Everyone born from [birth year] through 1965.  Anyone with known risk factors for hepatitis C. Sexually transmitted infections (STIs)  You should be screened for sexually transmitted infections (STIs) including gonorrhea and chlamydia if:  You are sexually active and are younger than 69 years of age.  You are older than 69 years of age and your health care provider tells you that you are at risk for this type of infection.  Your sexual activity has changed since you were last screened and you are at an increased risk for chlamydia or gonorrhea. Ask your health care provider if you are at risk.  If you do not have HIV, but are at risk, it may be recommended that you take a prescription medicine daily to prevent HIV infection. This is called pre-exposure prophylaxis (PrEP). You are considered at risk if:  You are sexually active and do not regularly use condoms or know the HIV status of your partner(s).  You take drugs by injection.  You are sexually  active with a partner who has HIV. Talk with your health care provider about whether you are at high risk of being infected with HIV. If you choose to begin PrEP, you should first be tested for HIV. You should then be tested every 3 months for as long as you are taking PrEP.  PREGNANCY   If you are premenopausal and you may become pregnant, ask your health care provider about preconception counseling.  If you may  become pregnant, take 400 to 800 micrograms (mcg) of folic acid every day.  If you want to prevent pregnancy, talk to your health care provider about birth control (contraception). OSTEOPOROSIS AND MENOPAUSE   Osteoporosis is a disease in which the bones lose minerals and strength with aging. This can result in serious bone fractures. Your risk for osteoporosis can be identified using a bone density scan.  If you are 61 years of age or older, or if you are at risk for osteoporosis and fractures, ask your health care provider if you should be screened.  Ask your health care provider whether you should take a calcium or vitamin D supplement to lower your risk for osteoporosis.  Menopause may have certain physical symptoms and risks.  Hormone replacement therapy may reduce some of these symptoms and risks. Talk to your health care provider about whether hormone replacement therapy is right for you.  HOME CARE INSTRUCTIONS   Schedule regular health, dental, and eye exams.  Stay current with your immunizations.   Do not use any tobacco products including cigarettes, chewing tobacco, or electronic cigarettes.  If you are pregnant, do not drink alcohol.  If you are breastfeeding, limit how much and how often you drink alcohol.  Limit alcohol intake to no more than 1 drink per day for nonpregnant women. One drink equals 12 ounces of beer, 5 ounces of wine, or 1 ounces of hard liquor.  Do not use street drugs.  Do not share needles.  Ask your health care provider for help if  you need support or information about quitting drugs.  Tell your health care provider if you often feel depressed.  Tell your health care provider if you have ever been abused or do not feel safe at home.   This information is not intended to replace advice given to you by your health care provider. Make sure you discuss any questions you have with your health care provider.   Document Released: 11/11/2010 Document Revised: 05/19/2014 Document Reviewed: 03/30/2013 Elsevier Interactive Patient Education [employer].

## 2015-05-01 NOTE — Progress Notes (Signed)
   Subjective:    Patient ID: Kathryn Stein, female    DOB: Jul 08, 1945, 69 y.o.   MRN: [id number]  Pt presents to the office today to recheck HTN and for chronic follow up. Pt's BP  Is at goal today! Hypertension This is a chronic problem. The current episode started more than 1 year ago. The problem has been resolved since onset. The problem is controlled. Pertinent negatives include no blurred vision, headaches, palpitations, peripheral edema or shortness of breath. Risk factors for coronary artery disease include dyslipidemia, post-menopausal state, sedentary lifestyle and family history. Past treatments include diuretics. The current treatment provides moderate improvement. There is no history of kidney disease, CAD/MI, CVA or heart failure.  Hyperlipidemia This is a chronic problem. The current episode started more than 1 year ago. The problem is uncontrolled. Recent lipid tests were reviewed and are high. Pertinent negatives include no shortness of breath. Current antihyperlipidemic treatment includes statins. The current treatment provides mild improvement of lipids. Risk factors for coronary artery disease include dyslipidemia, family history, hypertension and post-menopausal.      Review of Systems  Constitutional: Negative.   HENT: Negative.   Eyes: Negative.  Negative for blurred vision.  Respiratory: Negative.  Negative for shortness of breath.   Cardiovascular: Negative.  Negative for palpitations.  Gastrointestinal: Negative.   Endocrine: Negative.   Genitourinary: Negative.   Musculoskeletal: Negative.   Neurological: Negative.  Negative for headaches.  Hematological: Negative.   Psychiatric/Behavioral: Negative.   All other systems reviewed and are negative.      Objective:   Physical Exam  Constitutional: She is oriented to person, place, and time. She appears well-developed and well-nourished. No distress.  HENT:  Head: Normocephalic and atraumatic.  Right Ear:  External ear normal.  Left Ear: External ear normal.  Nose: Nose normal.  Mouth/Throat: Oropharynx is clear and moist.  Eyes: Pupils are equal, round, and reactive to light.  Neck: Normal range of motion. Neck supple. No thyromegaly present.  Cardiovascular: Normal rate, regular rhythm, normal heart sounds and intact distal pulses.   No murmur heard. Pulmonary/Chest: Effort normal and breath sounds normal. No respiratory distress. She has no wheezes.  Abdominal: Soft. Bowel sounds are normal. She exhibits no distension. There is no tenderness.  Musculoskeletal: Normal range of motion. She exhibits no edema or tenderness.  Neurological: She is alert and oriented to person, place, and time. She has normal reflexes. No cranial nerve deficit.  Skin: Skin is warm and dry.  Psychiatric: She has a normal mood and affect. Her behavior is normal. Judgment and thought content normal.  Vitals reviewed.     BP 121/83 mmHg  Pulse 68  Temp(Src) 97.1 F (36.2 C) (Oral)  Ht '5\' 3"'$  (1.6 m)  Wt 147 lb 12.8 oz (67.042 kg)  BMI 26.19 kg/m2     Assessment & Plan:  1. Essential hypertension, benign - CMP14+EGFR  2. Osteoporosis - CMP14+EGFR  3. HLD (hyperlipidemia) - CMP14+EGFR - Lipid panel  4. Depression - CMP14+EGFR  5. Vitamin D deficiency - CMP14+EGFR - VITAMIN D 25 Hydroxy (Vit-D Deficiency, Fractures)  6. Need for hepatitis C screening test - CMP14+EGFR - Hepatitis C antibody   Continue all meds Labs pending Health Maintenance reviewed- Mammogram scheduled for 06/12/15 Diet and exercise encouraged RTO 6 months  Evelina Dun, FNP

## 2015-05-01 NOTE — Addendum Note (Signed)
Addended by: Earlene Plater on: 05/01/2015 11:57 AM   Modules accepted: Miquel Dunn

## 2015-05-02 LAB — CMP14+EGFR
A/G RATIO: 1.9 (ref 1.1–2.5)
ALT: 15 IU/L (ref 0–32)
AST: 24 IU/L (ref 0–40)
Albumin: 4.6 g/dL (ref 3.6–4.8)
Alkaline Phosphatase: 71 IU/L (ref 39–117)
BILIRUBIN TOTAL: 0.4 mg/dL (ref 0.0–1.2)
BUN/Creatinine Ratio: 16 (ref 11–26)
BUN: 15 mg/dL (ref 8–27)
CHLORIDE: 98 mmol/L (ref 96–106)
CO2: 27 mmol/L (ref 18–29)
Calcium: 9.9 mg/dL (ref 8.7–10.3)
Creatinine, Ser: 0.96 mg/dL (ref 0.57–1.00)
GFR calc non Af Amer: 61 mL/min/{1.73_m2} (ref >59)
GFR, EST AFRICAN AMERICAN: 70 mL/min/{1.73_m2} (ref >59)
Globulin, Total: 2.4 g/dL (ref 1.5–4.5)
Glucose: 74 mg/dL (ref 65–99)
POTASSIUM: 3.8 mmol/L (ref 3.5–5.2)
Sodium: 142 mmol/L (ref 134–144)
Total Protein: 7 g/dL (ref 6.0–8.5)

## 2015-05-02 LAB — LIPID PANEL
Chol/HDL Ratio: 2.3 ratio units (ref 0.0–4.4)
Cholesterol, Total: 152 mg/dL (ref 100–199)
HDL: 65 mg/dL (ref >39)
LDL Calculated: 77 mg/dL (ref 0–99)
TRIGLYCERIDES: 52 mg/dL (ref 0–149)
VLDL Cholesterol Cal: 10 mg/dL (ref 5–40)

## 2015-05-02 LAB — VITAMIN D 25 HYDROXY (VIT D DEFICIENCY, FRACTURES): VIT D 25 HYDROXY: 38.6 ng/mL (ref 30.0–100.0)

## 2015-05-02 LAB — HEPATITIS C ANTIBODY: Hep C Virus Ab: <0.1 s/co ratio (ref 0.0–0.9)

## 2015-05-03 ENCOUNTER — Other Ambulatory Visit: Payer: Self-pay

## 2015-05-03 MED ORDER — SIMVASTATIN 40 MG PO TABS: 40.0000 mg | ORAL_TABLET | Freq: Every day | ORAL | Status: DC

## 2015-05-15 ENCOUNTER — Other Ambulatory Visit: Payer: Self-pay | Admitting: Family

## 2015-06-22 ENCOUNTER — Telehealth: Payer: Self-pay | Admitting: Family

## 2015-08-29 ENCOUNTER — Encounter: Payer: Self-pay | Admitting: Gastroenterology

## 2015-11-01 ENCOUNTER — Ambulatory Visit (INDEPENDENT_AMBULATORY_CARE_PROVIDER_SITE_OTHER): Payer: Medicare Other | Admitting: Family

## 2015-11-01 ENCOUNTER — Encounter: Payer: Self-pay | Admitting: Family

## 2015-11-01 VITALS — BP 134/90 | HR 65 | Temp 97.0°F | Ht 63.0 in | Wt 147.6 lb

## 2015-11-01 DIAGNOSIS — F32A Depression, unspecified: Secondary | ICD-10-CM

## 2015-11-01 DIAGNOSIS — I10 Essential (primary) hypertension: Secondary | ICD-10-CM

## 2015-11-01 DIAGNOSIS — E785 Hyperlipidemia, unspecified: Secondary | ICD-10-CM

## 2015-11-01 DIAGNOSIS — E559 Vitamin D deficiency, unspecified: Secondary | ICD-10-CM

## 2015-11-01 DIAGNOSIS — F329 Major depressive disorder, single episode, unspecified: Secondary | ICD-10-CM

## 2015-11-01 DIAGNOSIS — E663 Overweight: Secondary | ICD-10-CM | POA: Diagnosis not present

## 2015-11-01 DIAGNOSIS — Z1211 Encounter for screening for malignant neoplasm of colon: Secondary | ICD-10-CM | POA: Diagnosis not present

## 2015-11-01 DIAGNOSIS — M810 Age-related osteoporosis without current pathological fracture: Secondary | ICD-10-CM

## 2015-11-01 DIAGNOSIS — R636 Underweight: Secondary | ICD-10-CM | POA: Insufficient documentation

## 2015-11-01 MED ORDER — CITALOPRAM HYDROBROMIDE 40 MG PO TABS: ORAL_TABLET | ORAL | Status: DC

## 2015-11-01 MED ORDER — SIMVASTATIN 40 MG PO TABS: 40.0000 mg | ORAL_TABLET | Freq: Every day | ORAL | Status: DC

## 2015-11-01 MED ORDER — RALOXIFENE HCL 60 MG PO TABS: ORAL_TABLET | ORAL | Status: DC

## 2015-11-01 MED ORDER — CYPROHEPTADINE HCL 4 MG PO TABS: 4.0000 mg | ORAL_TABLET | Freq: Three times a day (TID) | ORAL | Status: DC | PRN

## 2015-11-01 MED ORDER — HYDROCHLOROTHIAZIDE 12.5 MG PO TABS
ORAL_TABLET | ORAL | Status: DC
Start: 2015-11-01 — End: 2016-11-21

## 2015-11-01 NOTE — Patient Instructions (Signed)
Health Maintenance, Female Adopting a healthy lifestyle and getting preventive care can go a long way to promote health and wellness. Talk with your health care provider about what schedule of regular examinations is right for you. This is a good chance for you to check in with your provider about disease prevention and staying healthy. In between checkups, there are plenty of things you can do on your own. Experts have done a lot of research about which lifestyle changes and preventive measures are most likely to keep you healthy. Ask your health care provider for more information. WEIGHT AND DIET  Eat a healthy diet  Be sure to include plenty of vegetables, fruits, low-fat dairy products, and lean protein.  Do not eat a lot of foods high in solid fats, added sugars, or salt.  Get regular exercise. This is one of the most important things you can do for your health.  Most adults should exercise for at least 150 minutes each week. The exercise should increase your heart rate and make you sweat (moderate-intensity exercise).  Most adults should also do strengthening exercises at least twice a week. This is in addition to the moderate-intensity exercise.  Maintain a healthy weight  Body mass index (BMI) is a measurement that can be used to identify possible weight problems. It estimates body fat based on height and weight. Your health care provider can help determine your BMI and help you achieve or maintain a healthy weight.  For females 20 years of age and older:   A BMI below 18.5 is considered underweight.  A BMI of 18.5 to 24.9 is normal.  A BMI of 25 to 29.9 is considered overweight.  A BMI of 30 and above is considered obese.  Watch levels of cholesterol and blood lipids  You should start having your blood tested for lipids and cholesterol at 70 years of age, then have this test every 5 years.  You may need to have your cholesterol levels checked more often if:  Your lipid  or cholesterol levels are high.  You are older than 70 years of age.  You are at high risk for heart disease.  CANCER SCREENING   Lung Cancer  Lung cancer screening is recommended for adults 55-80 years old who are at high risk for lung cancer because of a history of smoking.  A yearly low-dose CT scan of the lungs is recommended for people who:  Currently smoke.  Have quit within the past 15 years.  Have at least a 30-pack-year history of smoking. A pack year is smoking an average of one pack of cigarettes a day for 1 year.  Yearly screening should continue until it has been 15 years since you quit.  Yearly screening should stop if you develop a health problem that would prevent you from having lung cancer treatment.  Breast Cancer  Practice breast self-awareness. This means understanding how your breasts normally appear and feel.  It also means doing regular breast self-exams. Let your health care provider know about any changes, no matter how small.  If you are in your 20s or 30s, you should have a clinical breast exam (CBE) by a health care provider every 1-3 years as part of a regular health exam.  If you are 40 or older, have a CBE every year. Also consider having a breast X-ray (mammogram) every year.  If you have a family history of breast cancer, talk to your health care provider about genetic screening.  If you   are at high risk for breast cancer, talk to your health care provider about having an MRI and a mammogram every year.  Breast cancer gene (BRCA) assessment is recommended for women who have family members with BRCA-related cancers. BRCA-related cancers include:  Breast.  Ovarian.  Tubal.  Peritoneal cancers.  Results of the assessment will determine the need for genetic counseling and BRCA1 and BRCA2 testing. Cervical Cancer Your health care provider may recommend that you be screened regularly for cancer of the pelvic organs (ovaries, uterus, and  vagina). This screening involves a pelvic examination, including checking for microscopic changes to the surface of your cervix (Pap test). You may be encouraged to have this screening done every 3 years, beginning at age 21.  For women ages 30-65, health care providers may recommend pelvic exams and Pap testing every 3 years, or they may recommend the Pap and pelvic exam, combined with testing for human papilloma virus (HPV), every 5 years. Some types of HPV increase your risk of cervical cancer. Testing for HPV may also be done on women of any age with unclear Pap test results.  Other health care providers may not recommend any screening for nonpregnant women who are considered low risk for pelvic cancer and who do not have symptoms. Ask your health care provider if a screening pelvic exam is right for you.  If you have had past treatment for cervical cancer or a condition that could lead to cancer, you need Pap tests and screening for cancer for at least 20 years after your treatment. If Pap tests have been discontinued, your risk factors (such as having a new sexual partner) need to be reassessed to determine if screening should resume. Some women have medical problems that increase the chance of getting cervical cancer. In these cases, your health care provider may recommend more frequent screening and Pap tests. Colorectal Cancer  This type of cancer can be detected and often prevented.  Routine colorectal cancer screening usually begins at 70 years of age and continues through 70 years of age.  Your health care provider may recommend screening at an earlier age if you have risk factors for colon cancer.  Your health care provider may also recommend using home test kits to check for hidden blood in the stool.  A small camera at the end of a tube can be used to examine your colon directly (sigmoidoscopy or colonoscopy). This is done to check for the earliest forms of colorectal  cancer.  Routine screening usually begins at age 50.  Direct examination of the colon should be repeated every 5-10 years through 70 years of age. However, you may need to be screened more often if early forms of precancerous polyps or small growths are found. Skin Cancer  Check your skin from head to toe regularly.  Tell your health care provider about any new moles or changes in moles, especially if there is a change in a mole's shape or color.  Also tell your health care provider if you have a mole that is larger than the size of a pencil eraser.  Always use sunscreen. Apply sunscreen liberally and repeatedly throughout the day.  Protect yourself by wearing long sleeves, pants, a wide-brimmed hat, and sunglasses whenever you are outside. HEART DISEASE, DIABETES, AND HIGH BLOOD PRESSURE   High blood pressure causes heart disease and increases the risk of stroke. High blood pressure is more likely to develop in:  People who have blood pressure in the high end   of the normal range (130-139/85-89 mm Hg).  People who are overweight or obese.  People who are African American.  If you are 38-23 years of age, have your blood pressure checked every 3-5 years. If you are 61 years of age or older, have your blood pressure checked every year. You should have your blood pressure measured twice--once when you are at a hospital or clinic, and once when you are not at a hospital or clinic. Record the average of the two measurements. To check your blood pressure when you are not at a hospital or clinic, you can use:  An automated blood pressure machine at a pharmacy.  A home blood pressure monitor.  If you are between 45 years and 39 years old, ask your health care provider if you should take aspirin to prevent strokes.  Have regular diabetes screenings. This involves taking a blood sample to check your fasting blood sugar level.  If you are at a normal weight and have a low risk for diabetes,  have this test once every three years after 70 years of age.  If you are overweight and have a high risk for diabetes, consider being tested at a younger age or more often. PREVENTING INFECTION  Hepatitis B  If you have a higher risk for hepatitis B, you should be screened for this virus. You are considered at high risk for hepatitis B if:  You were born in a country where hepatitis B is common. Ask your health care provider which countries are considered high risk.  Your parents were born in a high-risk country, and you have not been immunized against hepatitis B (hepatitis B vaccine).  You have HIV or AIDS.  You use needles to inject street drugs.  You live with someone who has hepatitis B.  You have had sex with someone who has hepatitis B.  You get hemodialysis treatment.  You take certain medicines for conditions, including cancer, organ transplantation, and autoimmune conditions. Hepatitis C  Blood testing is recommended for:  Everyone born from [birth year] through 1965.  Anyone with known risk factors for hepatitis C. Sexually transmitted infections (STIs)  You should be screened for sexually transmitted infections (STIs) including gonorrhea and chlamydia if:  You are sexually active and are younger than 70 years of age.  You are older than 70 years of age and your health care provider tells you that you are at risk for this type of infection.  Your sexual activity has changed since you were last screened and you are at an increased risk for chlamydia or gonorrhea. Ask your health care provider if you are at risk.  If you do not have HIV, but are at risk, it may be recommended that you take a prescription medicine daily to prevent HIV infection. This is called pre-exposure prophylaxis (PrEP). You are considered at risk if:  You are sexually active and do not regularly use condoms or know the HIV status of your partner(s).  You take drugs by injection.  You are sexually  active with a partner who has HIV. Talk with your health care provider about whether you are at high risk of being infected with HIV. If you choose to begin PrEP, you should first be tested for HIV. You should then be tested every 3 months for as long as you are taking PrEP.  PREGNANCY   If you are premenopausal and you may become pregnant, ask your health care provider about preconception counseling.  If you may  become pregnant, take 400 to 800 micrograms (mcg) of folic acid every day.  If you want to prevent pregnancy, talk to your health care provider about birth control (contraception). OSTEOPOROSIS AND MENOPAUSE   Osteoporosis is a disease in which the bones lose minerals and strength with aging. This can result in serious bone fractures. Your risk for osteoporosis can be identified using a bone density scan.  If you are 61 years of age or older, or if you are at risk for osteoporosis and fractures, ask your health care provider if you should be screened.  Ask your health care provider whether you should take a calcium or vitamin D supplement to lower your risk for osteoporosis.  Menopause may have certain physical symptoms and risks.  Hormone replacement therapy may reduce some of these symptoms and risks. Talk to your health care provider about whether hormone replacement therapy is right for you.  HOME CARE INSTRUCTIONS   Schedule regular health, dental, and eye exams.  Stay current with your immunizations.   Do not use any tobacco products including cigarettes, chewing tobacco, or electronic cigarettes.  If you are pregnant, do not drink alcohol.  If you are breastfeeding, limit how much and how often you drink alcohol.  Limit alcohol intake to no more than 1 drink per day for nonpregnant women. One drink equals 12 ounces of beer, 5 ounces of wine, or 1 ounces of hard liquor.  Do not use street drugs.  Do not share needles.  Ask your health care provider for help if  you need support or information about quitting drugs.  Tell your health care provider if you often feel depressed.  Tell your health care provider if you have ever been abused or do not feel safe at home.   This information is not intended to replace advice given to you by your health care provider. Make sure you discuss any questions you have with your health care provider.   Document Released: 11/11/2010 Document Revised: 05/19/2014 Document Reviewed: 03/30/2013 Elsevier Interactive Patient Education [employer].

## 2015-11-01 NOTE — Progress Notes (Signed)
Subjective:    Patient ID: Kathryn Stein, female    DOB: 10-18-1945, 70 y.o.   MRN: [id number]  Pt presents to the office today  for chronic follow up.  Hypertension This is a chronic problem. The current episode started more than 1 year ago. The problem has been resolved since onset. The problem is controlled. Pertinent negatives include no blurred vision, headaches, palpitations, peripheral edema or shortness of breath. Risk factors for coronary artery disease include dyslipidemia, post-menopausal state, sedentary lifestyle and family history. Past treatments include diuretics. The current treatment provides moderate improvement. There is no history of kidney disease, CAD/MI, CVA or heart failure.  Hyperlipidemia This is a chronic problem. The current episode started more than 1 year ago. The problem is controlled. Recent lipid tests were reviewed and are normal. Pertinent negatives include no shortness of breath. Current antihyperlipidemic treatment includes statins. The current treatment provides mild improvement of lipids. Risk factors for coronary artery disease include dyslipidemia, family history, hypertension and post-menopausal.  Depression        This is a chronic problem.  The current episode started more than 1 year ago.   The onset quality is gradual.   The problem has been waxing and waning since onset.  Associated symptoms include helplessness and sad.  Associated symptoms include no hopelessness, not irritable, no restlessness, no decreased interest, no headaches and no suicidal ideas.  Past treatments include SSRIs - Selective serotonin reuptake inhibitors.  Compliance with treatment is good. Osteoporosis Pt currently taking Evista 60 mg daily. PT states her last Dexa scan was 09/01/13. Will order Dexa today.     Review of Systems  Constitutional: Negative.   HENT: Negative.   Eyes: Negative.  Negative for blurred vision.  Respiratory: Negative.  Negative for shortness of  breath.   Cardiovascular: Negative.  Negative for palpitations.  Gastrointestinal: Negative.   Endocrine: Negative.   Genitourinary: Negative.   Musculoskeletal: Negative.   Neurological: Negative.  Negative for headaches.  Hematological: Negative.   Psychiatric/Behavioral: Positive for depression. Negative for suicidal ideas.  All other systems reviewed and are negative.      Objective:   Physical Exam  Constitutional: She is oriented to person, place, and time. She appears well-developed and well-nourished. She is not irritable. No distress.  HENT:  Head: Normocephalic and atraumatic.  Right Ear: External ear normal.  Left Ear: External ear normal.  Nose: Nose normal.  Mouth/Throat: Oropharynx is clear and moist.  Eyes: Pupils are equal, round, and reactive to light.  Neck: Normal range of motion. Neck supple. No thyromegaly present.  Cardiovascular: Normal rate, regular rhythm, normal heart sounds and intact distal pulses.   No murmur heard. Pulmonary/Chest: Effort normal and breath sounds normal. No respiratory distress. She has no wheezes.  Abdominal: Soft. Bowel sounds are normal. She exhibits no distension. There is no tenderness.  Musculoskeletal: Normal range of motion. She exhibits no edema or tenderness.  Neurological: She is alert and oriented to person, place, and time. She has normal reflexes. No cranial nerve deficit.  Skin: Skin is warm and dry.  Psychiatric: She has a normal mood and affect. Her behavior is normal. Judgment and thought content normal.  Vitals reviewed.     BP 134/90 mmHg  Pulse 65  Temp(Src) 97 F (36.1 C) (Oral)  Ht _0  (1.6 m)  Wt 147 lb 9.6 oz (66.951 kg)  BMI 26.15 kg/m2     Assessment & Plan:  1. Essential hypertension, benign - CMP14+EGFR  2. Osteoporosis - DG [facility] DEXA - CMP14+EGFR  3. Depression - CMP14+EGFR  4. HLD (hyperlipidemia) - CMP14+EGFR - Lipid panel  5. Vitamin D deficiency - CMP14+EGFR  6.  Overweight (BMI 25.0-29.9) - CMP14+EGFR  7. Colon cancer screening - Fecal occult blood, imunochemical; Future    Continue all meds Labs pending Health Maintenance reviewed- Mammogram scheduled for 12/10/15 Diet and exercise encouraged RTO 6 months  Evelina Dun, FNP

## 2015-11-01 NOTE — Addendum Note (Signed)
Addended by: Evelina Dun A on: 11/01/2015 09:14 AM   Modules accepted: Orders

## 2015-11-02 LAB — LIPID PANEL
CHOLESTEROL TOTAL: 182 mg/dL (ref 100–199)
Chol/HDL Ratio: 2.6 ratio units (ref 0.0–4.4)
HDL: 70 mg/dL (ref >39)
LDL Calculated: 104 mg/dL — ABNORMAL HIGH (ref 0–99)
Triglycerides: 38 mg/dL (ref 0–149)
VLDL Cholesterol Cal: 8 mg/dL (ref 5–40)

## 2015-11-02 LAB — CMP14+EGFR
A/G RATIO: 1.8 (ref 1.2–2.2)
ALBUMIN: 4.5 g/dL (ref 3.6–4.8)
ALK PHOS: 85 IU/L (ref 39–117)
ALT: 17 IU/L (ref 0–32)
AST: 18 IU/L (ref 0–40)
BILIRUBIN TOTAL: 0.5 mg/dL (ref 0.0–1.2)
BUN / CREAT RATIO: 24 (ref 12–28)
BUN: 20 mg/dL (ref 8–27)
CHLORIDE: 101 mmol/L (ref 96–106)
CO2: 26 mmol/L (ref 18–29)
Calcium: 9.8 mg/dL (ref 8.7–10.3)
Creatinine, Ser: 0.84 mg/dL (ref 0.57–1.00)
GFR calc non Af Amer: 71 mL/min/{1.73_m2} (ref >59)
GFR, EST AFRICAN AMERICAN: 82 mL/min/{1.73_m2} (ref >59)
GLOBULIN, TOTAL: 2.5 g/dL (ref 1.5–4.5)
Glucose: 74 mg/dL (ref 65–99)
Potassium: 4.7 mmol/L (ref 3.5–5.2)
SODIUM: 141 mmol/L (ref 134–144)
TOTAL PROTEIN: 7 g/dL (ref 6.0–8.5)

## 2015-11-08 ENCOUNTER — Other Ambulatory Visit: Payer: Medicare Other

## 2015-11-08 DIAGNOSIS — Z1211 Encounter for screening for malignant neoplasm of colon: Secondary | ICD-10-CM

## 2015-11-12 LAB — FECAL OCCULT BLOOD, IMMUNOCHEMICAL: Fecal Occult Bld: NEGATIVE

## 2015-12-10 ENCOUNTER — Encounter: Payer: Medicare Other | Admitting: *Deleted

## 2015-12-19 ENCOUNTER — Ambulatory Visit (INDEPENDENT_AMBULATORY_CARE_PROVIDER_SITE_OTHER): Payer: Medicare Other

## 2015-12-19 DIAGNOSIS — M810 Age-related osteoporosis without current pathological fracture: Secondary | ICD-10-CM

## 2016-02-18 ENCOUNTER — Other Ambulatory Visit: Payer: Self-pay | Admitting: Family

## 2016-02-18 DIAGNOSIS — F329 Major depressive disorder, single episode, unspecified: Secondary | ICD-10-CM

## 2016-02-18 DIAGNOSIS — F32A Depression, unspecified: Secondary | ICD-10-CM

## 2016-02-22 ENCOUNTER — Telehealth: Payer: Self-pay | Admitting: Family

## 2016-02-25 NOTE — Telephone Encounter (Signed)
scheduled

## 2016-03-11 ENCOUNTER — Ambulatory Visit: Payer: Medicare Other

## 2016-06-01 ENCOUNTER — Other Ambulatory Visit: Payer: Self-pay | Admitting: Family

## 2016-06-01 DIAGNOSIS — F329 Major depressive disorder, single episode, unspecified: Secondary | ICD-10-CM

## 2016-06-01 DIAGNOSIS — F32A Depression, unspecified: Secondary | ICD-10-CM

## 2016-06-01 DIAGNOSIS — E785 Hyperlipidemia, unspecified: Secondary | ICD-10-CM

## 2016-06-03 NOTE — Telephone Encounter (Signed)
Patient NTBS for follow up and lab work  

## 2016-08-19 ENCOUNTER — Telehealth: Payer: Self-pay | Admitting: Family

## 2016-08-19 ENCOUNTER — Other Ambulatory Visit: Payer: Self-pay | Admitting: Family

## 2016-08-19 DIAGNOSIS — Z1231 Encounter for screening mammogram for malignant neoplasm of breast: Secondary | ICD-10-CM

## 2016-08-19 NOTE — Telephone Encounter (Signed)
Faxed to [facility] °

## 2016-08-27 NOTE — Telephone Encounter (Signed)
Films sent and she is scheduled for screening  mammo on 09/01/16

## 2016-09-01 ENCOUNTER — Ambulatory Visit (HOSPITAL_COMMUNITY): Payer: Medicare Other

## 2016-09-08 ENCOUNTER — Other Ambulatory Visit: Payer: Self-pay | Admitting: Family

## 2016-09-08 DIAGNOSIS — F32A Depression, unspecified: Secondary | ICD-10-CM

## 2016-09-08 DIAGNOSIS — F329 Major depressive disorder, single episode, unspecified: Secondary | ICD-10-CM

## 2016-09-16 ENCOUNTER — Other Ambulatory Visit: Payer: Self-pay | Admitting: Family

## 2016-09-16 DIAGNOSIS — F32A Depression, unspecified: Secondary | ICD-10-CM

## 2016-09-16 DIAGNOSIS — F329 Major depressive disorder, single episode, unspecified: Secondary | ICD-10-CM

## 2016-11-21 ENCOUNTER — Other Ambulatory Visit: Payer: Self-pay | Admitting: Family

## 2016-11-21 DIAGNOSIS — I10 Essential (primary) hypertension: Secondary | ICD-10-CM

## 2017-04-01 ENCOUNTER — Encounter (HOSPITAL_COMMUNITY): Payer: Self-pay

## 2017-04-01 ENCOUNTER — Ambulatory Visit (HOSPITAL_COMMUNITY)
Admission: RE | Admit: 2017-04-01 | Discharge: 2017-04-01 | Disposition: A | Discharge status: Home / Self Care | Payer: Medicare Other | Source: Ambulatory Visit | Attending: Family | Admitting: Family

## 2017-04-01 DIAGNOSIS — Z1231 Encounter for screening mammogram for malignant neoplasm of breast: Secondary | ICD-10-CM | POA: Insufficient documentation

## 2017-06-05 ENCOUNTER — Encounter (HOSPITAL_COMMUNITY): Payer: Self-pay | Admitting: Student

## 2017-06-05 ENCOUNTER — Emergency Department (HOSPITAL_COMMUNITY)
Admission: EM | Admit: 2017-06-05 | Discharge: 2017-06-06 | Disposition: A | Discharge status: Home / Self Care | Payer: Medicare Other | Attending: Emergency Medicine | Admitting: Emergency Medicine

## 2017-06-05 ENCOUNTER — Other Ambulatory Visit: Payer: Self-pay

## 2017-06-05 ENCOUNTER — Emergency Department (HOSPITAL_COMMUNITY): Payer: Medicare Other

## 2017-06-05 DIAGNOSIS — I10 Essential (primary) hypertension: Secondary | ICD-10-CM | POA: Diagnosis not present

## 2017-06-05 DIAGNOSIS — R1012 Left upper quadrant pain: Secondary | ICD-10-CM | POA: Diagnosis not present

## 2017-06-05 DIAGNOSIS — Z79899 Other long term (current) drug therapy: Secondary | ICD-10-CM | POA: Diagnosis not present

## 2017-06-05 DIAGNOSIS — R112 Nausea with vomiting, unspecified: Secondary | ICD-10-CM | POA: Insufficient documentation

## 2017-06-05 DIAGNOSIS — R1010 Upper abdominal pain, unspecified: Secondary | ICD-10-CM | POA: Insufficient documentation

## 2017-06-05 DIAGNOSIS — R140 Abdominal distension (gaseous): Secondary | ICD-10-CM | POA: Diagnosis not present

## 2017-06-05 DIAGNOSIS — Z7982 Long term (current) use of aspirin: Secondary | ICD-10-CM | POA: Diagnosis not present

## 2017-06-05 DIAGNOSIS — R1013 Epigastric pain: Secondary | ICD-10-CM | POA: Diagnosis not present

## 2017-06-05 DIAGNOSIS — R1011 Right upper quadrant pain: Secondary | ICD-10-CM | POA: Diagnosis not present

## 2017-06-05 LAB — URINALYSIS, ROUTINE W REFLEX MICROSCOPIC
BILIRUBIN URINE: NEGATIVE
Bacteria, UA: NONE SEEN
GLUCOSE, UA: NEGATIVE mg/dL
KETONES UR: 5 mg/dL — AB
Nitrite: NEGATIVE
PROTEIN: NEGATIVE mg/dL
Specific Gravity, Urine: 1.028 (ref 1.005–1.030)
pH: 5 (ref 5.0–8.0)

## 2017-06-05 LAB — CBC
HCT: 41 % (ref 36.0–46.0)
HEMOGLOBIN: 13 g/dL (ref 12.0–15.0)
MCH: 29.1 pg (ref 26.0–34.0)
MCHC: 31.7 g/dL (ref 30.0–36.0)
MCV: 91.9 fL (ref 78.0–100.0)
Platelets: 359 10*3/uL (ref 150–400)
RBC: 4.46 MIL/uL (ref 3.87–5.11)
RDW: 13.8 % (ref 11.5–15.5)
WBC: 5.7 10*3/uL (ref 4.0–10.5)

## 2017-06-05 LAB — COMPREHENSIVE METABOLIC PANEL
ALT: 17 U/L (ref 14–54)
ANION GAP: 12 (ref 5–15)
AST: 22 U/L (ref 15–41)
Albumin: 4.9 g/dL (ref 3.5–5.0)
Alkaline Phosphatase: 75 U/L (ref 38–126)
BUN: 16 mg/dL (ref 6–20)
CALCIUM: 10.2 mg/dL (ref 8.9–10.3)
CHLORIDE: 102 mmol/L (ref 101–111)
CO2: 29 mmol/L (ref 22–32)
Creatinine, Ser: 0.7 mg/dL (ref 0.44–1.00)
GFR calc Af Amer: >60 mL/min (ref >60)
GFR calc non Af Amer: >60 mL/min (ref >60)
Glucose, Bld: 89 mg/dL (ref 65–99)
Potassium: 3.6 mmol/L (ref 3.5–5.1)
SODIUM: 143 mmol/L (ref 135–145)
Total Bilirubin: 0.7 mg/dL (ref 0.3–1.2)
Total Protein: 8.2 g/dL — ABNORMAL HIGH (ref 6.5–8.1)

## 2017-06-05 LAB — LIPASE, BLOOD: LIPASE: 27 U/L (ref 11–51)

## 2017-06-05 MED ORDER — SODIUM CHLORIDE 0.9 % IV BOLUS (SEPSIS)
500.0000 mL | Freq: Once | INTRAVENOUS | Status: AC
  Administered 2017-06-05: 500 mL via INTRAVENOUS

## 2017-06-05 MED ORDER — ONDANSETRON HCL 4 MG/2ML IJ SOLN
4.0000 mg | Freq: Once | INTRAMUSCULAR | Status: AC
  Administered 2017-06-05: 4 mg via INTRAVENOUS
  Filled 2017-06-05: qty 2

## 2017-06-05 NOTE — ED Triage Notes (Addendum)
Pt reports abdominal pain and n/v since yesterday. Pt reports not having a "good" BM for 3 weeks. Pt states she has taken mag citrate on Monday and today. Pt also reports using an enema today with no results. Pt reports very little results from these medications. Pt states when she eats the food comes right back up.

## 2017-06-05 NOTE — ED Provider Notes (Signed)
[facility] EMERGENCY DEPARTMENT Provider Note   CSN: [id number] Arrival date & time: 06/05/17  2137     History   Chief Complaint Chief Complaint  Patient presents with  . Abdominal Pain    HPI Kathryn Stein is a 72 y.o. female.  Patient presents to the emergency department for evaluation of nausea and vomiting with abdominal pain.  Patient reports that she has not been having good bowel movements for several weeks.  In the past week she has used magnesium citrate twice and a fleets enema with only very small amount of hard bowel movements as a result.  Yesterday she started having increased upper abdominal pain with vomiting.  She has not been able to hold anything down because of the vomiting.  Pain is crampy and diffuse across the upper abdomen, no alleviating factors noted.      Past Medical History:  Diagnosis Date  . Anemia   . Anorexia   . Anxiety   . Arthritis   . Colon polyps   . Depression   . Dyspepsia   . Gastritis    found on EGD  . GERD (gastroesophageal reflux disease)   . Hyperlipidemia   . Hypertension   . Marital relationship problem   . Osteoporosis   . Postmenopausal   . Vertigo   . Vertigo due to previous cerebellar infarction     Patient Active Problem List   Diagnosis Date Noted  . Overweight (BMI 25.0-29.9) 11/01/2015  . Essential hypertension, benign 11/22/2013  . Vitamin D deficiency 11/04/2013  . HLD (hyperlipidemia) 07/29/2013  . Headache(784.0) 05/26/2013  . Anorexia symptom 01/18/2013  . Back pain 01/18/2013  . Depression 07/11/2012  . Osteoporosis 07/11/2012  . Personal history of colonic polyps 07/11/2012    Past Surgical History:  Procedure Laterality Date  . BREAST SURGERY Left    biopsy benign  . CESAREAN SECTION    . COLONOSCOPY    . MOUTH SURGERY     bone extraction  . TUBAL LIGATION      OB History    No data available       Home Medications    Prior to Admission medications   Medication Sig Start  Date End Date Taking? Authorizing Provider  aspirin EC 81 MG tablet Take 81 mg by mouth daily as needed. For pain    [provider]  citalopram (CELEXA) 40 MG tablet TAKE ONE TABLET BY MOUTH ONE TIME DAILY 06/03/16   Evelina Dun A, FNP  cyproheptadine (PERIACTIN) 4 MG tablet Take 1 tablet (4 mg total) by mouth 3 (three) times daily as needed. 11/01/15   Sharion Balloon, FNP  dicyclomine (BENTYL) 20 MG tablet Take 1 tablet (20 mg total) by mouth 4 (four) times daily -  before meals and at bedtime. 06/06/17   Orpah Greek, MD  fluticasone (FLONASE) 50 MCG/ACT nasal spray Place 2 sprays into both nostrils daily. 04/17/15   Evelina Dun A, FNP  hydrochlorothiazide (MICROZIDE) 12.5 MG capsule TAKE 1 TABLET (12.5 MG TOTAL) BY MOUTH DAILY. 11/21/16   Sharion Balloon, FNP  meclizine (ANTIVERT) 25 MG tablet Take 25 mg by mouth 3 (three) times daily as needed for dizziness. Reported on 11/01/2015    [provider]  Omega-3 Fatty Acids (FISH OIL PO) Take by mouth.    [provider]  ondansetron (ZOFRAN) 4 MG tablet Take 1 tablet (4 mg total) by mouth every 6 (six) hours. 06/06/17   Orpah Greek, MD  raloxifene (EVISTA) 60 MG tablet TAKE 1 TABLET BY MOUTH ONCE A DAY FOR BONES 11/01/15   Evelina Dun A, FNP  simvastatin (ZOCOR) 40 MG tablet TAKE 1 TABLET (40 MG TOTAL) BY MOUTH AT BEDTIME. 06/03/16   Sharion Balloon, FNP    Family History Family History  Problem Relation Age of Onset  . Colon cancer Mother   . Diabetes Mother   . Cancer Mother   . Heart disease Mother   . Hypertension Mother   . Stroke Mother   . Cancer Brother        throat - smoker  . Kidney disease Brother   . Prostate cancer Brother   . Heart disease Sister   . Cancer Sister        breast  . Diabetes Sister   . Liver disease Brother   . Diabetes Brother   . Hypertension Brother   . Stroke Father   . Diabetes Sister   . Heart disease Sister   . Hypertension Sister   .  Diabetes Sister   . Cancer Brother        prostate  . Stomach cancer Unknown        niece  . Kidney cancer Unknown        niece    Social History Social History   Tobacco Use  . Smoking status: Never Smoker  . Smokeless tobacco: Never Used  Substance Use Topics  . Alcohol use: No  . Drug use: No     Allergies   Codeine and Darvocet [propoxyphene n-acetaminophen]   Review of Systems Review of Systems  Gastrointestinal: Positive for abdominal pain, constipation, nausea and vomiting.  All other systems reviewed and are negative.    Physical Exam Updated Vital Signs BP (!) 144/81   Pulse 62   Temp 98.5 F (36.9 C) (Oral)   Resp 16   Ht 5\' 2"  (1.575 m)   Wt 65.8 kg (145 lb)   SpO2 96%   BMI 26.52 kg/m   Physical Exam  Constitutional: She is oriented to person, place, and time. She appears well-developed and well-nourished. No distress.  HENT:  Head: Normocephalic and atraumatic.  Right Ear: Hearing normal.  Left Ear: Hearing normal.  Nose: Nose normal.  Mouth/Throat: Oropharynx is clear and moist and mucous membranes are normal.  Eyes: Conjunctivae and EOM are normal. Pupils are equal, round, and reactive to light.  Neck: Normal range of motion. Neck supple.  Cardiovascular: Regular rhythm, S1 normal and S2 normal. Exam reveals no gallop and no friction rub.  No murmur heard. Pulmonary/Chest: Effort normal and breath sounds normal. No respiratory distress. She exhibits no tenderness.  Abdominal: Soft. Normal appearance and bowel sounds are normal. There is no hepatosplenomegaly. There is tenderness in the right upper quadrant, epigastric area and left upper quadrant. There is no rebound, no guarding, no tenderness at McBurney's point and negative Murphy's sign. No hernia.  Musculoskeletal: Normal range of motion.  Neurological: She is alert and oriented to person, place, and time. She has normal strength. No cranial nerve deficit or sensory deficit. Coordination  normal. GCS eye subscore is 4. GCS verbal subscore is 5. GCS motor subscore is 6.  Skin: Skin is warm, dry and intact. No rash noted. No cyanosis.  Psychiatric: She has a normal mood and affect. Her speech is normal and behavior is normal. Thought content normal.  Nursing note and vitals reviewed.    ED Treatments / Results  Labs (all labs ordered  are listed, but only abnormal results are displayed) Labs Reviewed  COMPREHENSIVE METABOLIC PANEL - Abnormal; Notable for the following components:      Result Value   Total Protein 8.2 (*)    All other components within normal limits  URINALYSIS, ROUTINE W REFLEX MICROSCOPIC - Abnormal; Notable for the following components:   Hgb urine dipstick SMALL (*)    Ketones, ur 5 (*)    Leukocytes, UA TRACE (*)    Squamous Epithelial / LPF 0-5 (*)    All other components within normal limits  LIPASE, BLOOD  CBC    EKG  EKG Interpretation None       Radiology Ct Abdomen Pelvis W Contrast  Result Date: 06/06/2017 CLINICAL DATA:  Abdominal distension EXAM: CT ABDOMEN AND PELVIS WITH CONTRAST TECHNIQUE: Multidetector CT imaging of the abdomen and pelvis was performed using the standard protocol following bolus administration of intravenous contrast. CONTRAST:  189mL ISOVUE-300 IOPAMIDOL (ISOVUE-300) INJECTION 61% COMPARISON:  07/17/2010 FINDINGS: Lower chest: Lung bases demonstrate no acute consolidation or pleural effusion. Normal heart size. Hepatobiliary: No focal liver abnormality is seen. No gallstones, gallbladder wall thickening, or biliary dilatation. Pancreas: Unremarkable. No pancreatic ductal dilatation or surrounding inflammatory changes. Spleen: Normal in size without focal abnormality. Adrenals/Urinary Tract: Adrenal glands are within normal limits. Subcentimeter cortical hypodensity in the right kidney, too small to further characterize. The bladder is normal Stomach/Bowel: The stomach is nonenlarged. There is no dilated small bowel.  No colon wall thickening. Normal appendix. Liquid stools in the right colon Vascular/Lymphatic: Moderate aortic atherosclerosis. No aneurysmal dilatation. No significantly enlarged lymph nodes Reproductive: Negative for adnexal mass.  Uterus is unremarkable. Other: Negative for free air or free fluid. Small fat in the umbilicus. Musculoskeletal: No acute or significant osseous findings. IMPRESSION: 1. No CT evidence for acute intra-abdominal or pelvic abnormality. Negative for a bowel obstruction. Electronically Signed   By: Donavan Foil M.D.   On: 06/06/2017 02:08    Procedures Procedures (including critical care time)  Medications Ordered in ED Medications  sodium chloride 0.9 % bolus 500 mL (0 mLs Intravenous Stopped 06/06/17 0102)  ondansetron (ZOFRAN) injection 4 mg (4 mg Intravenous Given 06/05/17 2339)  iopamidol (ISOVUE-300) 61 % injection 100 mL (100 mLs Intravenous Contrast Given 06/06/17 0105)     Initial Impression / Assessment and Plan / ED Course  I have reviewed the triage vital signs and the nursing notes.  Pertinent labs & imaging results that were available during my care of the patient were reviewed by me and considered in my medical decision making (see chart for details).     Presents to the ER for evaluation of abdominal pain with nausea and vomiting.  She has been feeling like she is constipated for several weeks.  This week she has taken magnesium citrate twice and a fleets enema.  After the second dose of magnesium citrate she started having the abdominal pain, nausea and vomiting.  She is a thin woman, there is no abdominal distention.  She had mild upper abdominal tenderness without guarding or rebound.  No masses palpated.  Lab work is entirely normal.  Patient underwent CT scan to further evaluate.  No acute pathology is seen.  There is no significant stool burden noted.  She is likely having cramping secondary to the laxatives.  Will stop laxatives, initiate Bentyl.   Follow-up with primary care doctor.  Final Clinical Impressions(s) / ED Diagnoses   Final diagnoses:  Pain of upper abdomen  Non-intractable vomiting  with nausea, unspecified vomiting type    ED Discharge Orders        Ordered    ondansetron (ZOFRAN) 4 MG tablet  Every 6 hours     06/06/17 0328    dicyclomine (BENTYL) 20 MG tablet  3 times daily before meals & bedtime     06/06/17 0328       Orpah Greek, MD 06/06/17 [phone]

## 2017-06-06 DIAGNOSIS — R140 Abdominal distension (gaseous): Secondary | ICD-10-CM | POA: Diagnosis not present

## 2017-06-06 MED ORDER — IOPAMIDOL (ISOVUE-300) INJECTION 61%
100.0000 mL | Freq: Once | INTRAVENOUS | Status: AC | PRN
  Administered 2017-06-06: 100 mL via INTRAVENOUS

## 2017-06-06 MED ORDER — ONDANSETRON HCL 4 MG PO TABS: 4.0000 mg | ORAL_TABLET | Freq: Four times a day (QID) | ORAL | 0 refills | Status: DC

## 2017-06-06 MED ORDER — DICYCLOMINE HCL 20 MG PO TABS: 20.0000 mg | ORAL_TABLET | Freq: Three times a day (TID) | ORAL | 0 refills | Status: DC

## 2017-08-11 ENCOUNTER — Encounter: Payer: Self-pay | Admitting: Family

## 2017-08-11 ENCOUNTER — Ambulatory Visit (INDEPENDENT_AMBULATORY_CARE_PROVIDER_SITE_OTHER): Payer: Medicare Other | Admitting: Family

## 2017-08-11 VITALS — BP 185/95 | HR 56 | Temp 97.1°F | Ht 62.0 in | Wt 121.2 lb

## 2017-08-11 DIAGNOSIS — I10 Essential (primary) hypertension: Secondary | ICD-10-CM | POA: Diagnosis not present

## 2017-08-11 DIAGNOSIS — Z1211 Encounter for screening for malignant neoplasm of colon: Secondary | ICD-10-CM | POA: Diagnosis not present

## 2017-08-11 DIAGNOSIS — R5383 Other fatigue: Secondary | ICD-10-CM

## 2017-08-11 DIAGNOSIS — F331 Major depressive disorder, recurrent, moderate: Secondary | ICD-10-CM | POA: Diagnosis not present

## 2017-08-11 DIAGNOSIS — R636 Underweight: Secondary | ICD-10-CM | POA: Diagnosis not present

## 2017-08-11 MED ORDER — CITALOPRAM HYDROBROMIDE 20 MG PO TABS: 20.0000 mg | ORAL_TABLET | Freq: Every day | ORAL | 5 refills | Status: DC

## 2017-08-11 MED ORDER — LISINOPRIL 10 MG PO TABS
10.0000 mg | ORAL_TABLET | Freq: Every day | ORAL | 3 refills | Status: DC
Start: 2017-08-11 — End: 2018-08-27

## 2017-08-11 NOTE — Progress Notes (Signed)
Subjective:    Patient ID: Kathryn Stein, female    DOB: 08-Apr-1946, 72 y.o.   MRN: [id number]  PT presents to the office today with complaints of fatigued, weight loss, and decrease appetite. PT states this started a few weeks ago.   Pt states she stopped all of her medications about 10 months ago because her prescription "ran out" and did not think  She needed them any more.  Weakness  Associated symptoms include fatigue and weakness.  Depression         This is a chronic problem.  The current episode started more than 1 year ago.   The onset quality is gradual.   The problem occurs intermittently.  The problem has been waxing and waning since onset.  Associated symptoms include fatigue, helplessness, hopelessness, restlessness and sad. Hypertension  This is a chronic problem. The current episode started more than 1 year ago. The problem is unchanged. The problem is uncontrolled. Associated symptoms include malaise/fatigue. Pertinent negatives include no peripheral edema, shortness of breath or sweats. The current treatment provides no improvement. There is no history of kidney disease, CAD/MI, CVA or heart failure.      Review of Systems  Constitutional: Positive for fatigue and malaise/fatigue.  Respiratory: Negative for shortness of breath.   Neurological: Positive for weakness.  Psychiatric/Behavioral: Positive for depression.  All other systems reviewed and are negative.      Objective:   Physical Exam  Constitutional: She is oriented to person, place, and time. She appears well-developed and well-nourished. No distress.  Underweight  HENT:  Head: Normocephalic and atraumatic.  Right Ear: External ear normal.  Left Ear: External ear normal.  Nose: Nose normal.  Mouth/Throat: Oropharynx is clear and moist.  Eyes: Pupils are equal, round, and reactive to light.  Neck: Normal range of motion. Neck supple. No thyromegaly present.  Cardiovascular: Normal rate, regular  rhythm, normal heart sounds and intact distal pulses.  No murmur heard. Pulmonary/Chest: Effort normal and breath sounds normal. No respiratory distress. She has no wheezes.  Abdominal: Soft. Bowel sounds are normal. She exhibits no distension. There is no tenderness.  Musculoskeletal: Normal range of motion. She exhibits no edema or tenderness.  Neurological: She is alert and oriented to person, place, and time.  Skin: Skin is warm and dry.  Psychiatric: She has a normal mood and affect. Her behavior is normal. Judgment and thought content normal.  Vitals reviewed.     BP (!) 185/95   Pulse (!) 56   Temp (!) 97.1 F (36.2 C) (Oral)   Ht '5\' 2"'$  (1.575 m)   Wt 121 lb 3.2 oz (55 kg)   BMI 22.17 kg/m      Assessment & Plan:  1. Moderate episode of recurrent major depressive disorder (HCC) Will restart Celexa 20 mg today Stress management  - CMP14+EGFR - citalopram (CELEXA) 20 MG tablet; Take 1 tablet (20 mg total) by mouth daily.  Dispense: 30 tablet; Refill: 5  2. Essential hypertension, benign Will start lisinopril 10 mg today -Dash diet information given -Exercise encouraged - Stress Management  -Continue current meds -RTO in 2 weeks  - lisinopril (PRINIVIL,ZESTRIL) 10 MG tablet; Take 1 tablet (10 mg total) by mouth daily.  Dispense: 90 tablet; Refill: 3 - CMP14+EGFR  3. Underweight Ensure TID with meals - CMP14+EGFR  4. Fatigue, unspecified type - Anemia Profile B - CMP14+EGFR - VITAMIN D 25 Hydroxy (Vit-D Deficiency, Fractures) - TSH  5. Colon cancer screening - Fecal  occult blood, imunochemical; Future   Continue all meds Labs pending Health Maintenance reviewed Diet and exercise encouraged RTO 2 weeks to recheck HTN  Evelina Dun, FNP

## 2017-08-11 NOTE — Patient Instructions (Signed)

## 2017-08-12 LAB — ANEMIA PROFILE B
BASOS ABS: 0 10*3/uL (ref 0.0–0.2)
BASOS: 1 %
EOS (ABSOLUTE): 0 10*3/uL (ref 0.0–0.4)
Eos: 1 %
FERRITIN: 180 ng/mL — AB (ref 15–150)
FOLATE: 12.1 ng/mL (ref >3.0)
HEMATOCRIT: 38.7 % (ref 34.0–46.6)
Hemoglobin: 12.1 g/dL (ref 11.1–15.9)
Immature Grans (Abs): 0 10*3/uL (ref 0.0–0.1)
Immature Granulocytes: 0 %
Iron Saturation: 41 % (ref 15–55)
Iron: 82 ug/dL (ref 27–139)
Lymphocytes Absolute: 1.3 10*3/uL (ref 0.7–3.1)
Lymphs: 32 %
MCH: 29.1 pg (ref 26.6–33.0)
MCHC: 31.3 g/dL — AB (ref 31.5–35.7)
MCV: 93 fL (ref 79–97)
MONOCYTES: 6 %
MONOS ABS: 0.2 10*3/uL (ref 0.1–0.9)
NEUTROS ABS: 2.5 10*3/uL (ref 1.4–7.0)
Neutrophils: 60 %
PLATELETS: 353 10*3/uL (ref 150–379)
RBC: 4.16 x10E6/uL (ref 3.77–5.28)
RDW: 14.1 % (ref 12.3–15.4)
RETIC CT PCT: 0.8 % (ref 0.6–2.6)
Total Iron Binding Capacity: 199 ug/dL — ABNORMAL LOW (ref 250–450)
UIBC: 117 ug/dL — ABNORMAL LOW (ref 118–369)
Vitamin B-12: 495 pg/mL (ref 232–1245)
WBC: 4.1 10*3/uL (ref 3.4–10.8)

## 2017-08-12 LAB — CMP14+EGFR
A/G RATIO: 1.9 (ref 1.2–2.2)
ALK PHOS: 62 IU/L (ref 39–117)
ALT: 10 IU/L (ref 0–32)
AST: 15 IU/L (ref 0–40)
Albumin: 4.2 g/dL (ref 3.5–4.8)
BUN / CREAT RATIO: 14 (ref 12–28)
BUN: 11 mg/dL (ref 8–27)
Bilirubin Total: 0.6 mg/dL (ref 0.0–1.2)
CHLORIDE: 104 mmol/L (ref 96–106)
CO2: 26 mmol/L (ref 20–29)
Calcium: 9.6 mg/dL (ref 8.7–10.3)
Creatinine, Ser: 0.79 mg/dL (ref 0.57–1.00)
GFR calc non Af Amer: 76 mL/min/{1.73_m2} (ref >59)
GFR, EST AFRICAN AMERICAN: 87 mL/min/{1.73_m2} (ref >59)
GLUCOSE: 83 mg/dL (ref 65–99)
Globulin, Total: 2.2 g/dL (ref 1.5–4.5)
POTASSIUM: 4.2 mmol/L (ref 3.5–5.2)
Sodium: 146 mmol/L — ABNORMAL HIGH (ref 134–144)
TOTAL PROTEIN: 6.4 g/dL (ref 6.0–8.5)

## 2017-08-12 LAB — TSH: TSH: 2.19 u[IU]/mL (ref 0.450–4.500)

## 2017-08-12 LAB — VITAMIN D 25 HYDROXY (VIT D DEFICIENCY, FRACTURES): VIT D 25 HYDROXY: 27.7 ng/mL — AB (ref 30.0–100.0)

## 2017-08-13 ENCOUNTER — Other Ambulatory Visit: Payer: Self-pay | Admitting: Family

## 2017-08-13 MED ORDER — VITAMIN D (ERGOCALCIFEROL) 1.25 MG (50000 UNIT) PO CAPS: 50000.0000 [IU] | ORAL_CAPSULE | ORAL | 3 refills | Status: DC

## 2017-08-18 ENCOUNTER — Encounter: Payer: Self-pay | Admitting: *Deleted

## 2017-08-18 ENCOUNTER — Ambulatory Visit (INDEPENDENT_AMBULATORY_CARE_PROVIDER_SITE_OTHER): Payer: Medicare Other | Admitting: *Deleted

## 2017-08-18 ENCOUNTER — Other Ambulatory Visit: Payer: Medicare Other

## 2017-08-18 VITALS — BP 158/80 | HR 57 | Ht 62.0 in | Wt 124.0 lb

## 2017-08-18 DIAGNOSIS — Z0000 Encounter for general adult medical examination without abnormal findings: Secondary | ICD-10-CM | POA: Diagnosis not present

## 2017-08-18 DIAGNOSIS — Z1211 Encounter for screening for malignant neoplasm of colon: Secondary | ICD-10-CM

## 2017-08-18 NOTE — Progress Notes (Addendum)
Subjective:   Kathryn Stein is a 72 y.o. female who presents for an Initial Medicare Annual Wellness Visit.  She lives at home with her husband of 41 years and her 56 year old granddaughter and 74 year old great granddaughter lives with them. She is raising her 84 year old great granddaughter. Her grandson lives across the yard and it is his child that lives with her.  She and her husband have a strained marriage. States that he complains a lot and that really bothers her. She is retired/disabled from [employer].    Review of System  Health is about the same as last year   Cardiac Risk Factors include: advanced age (>81men, >46 women);sedentary lifestyle;dyslipidemia;hypertension  GI: Decreased appetite. Feels hungry but satiated quickly. BM was this morning and it was hard. Takes MOM and an anema as needed.  Musc: Muscle cramps in legs at night    Objective:    Today's Vitals   08/18/17 1017  BP: (!) 158/80  Pulse: (!) 57  Weight: 124 lb (56.2 kg)  Height: 5\' 2"  (1.575 m)   Body mass index is 22.68 kg/m.  Advanced Directives 08/18/2017 06/05/2017 09/21/2014 07/21/2014  Does Patient Have a Medical Advance Directive? No No No No  Would patient like information on creating a medical advance directive? No - Patient declined No - Patient declined Yes - Educational materials given Yes - Educational materials given    Current Medications (verified) Outpatient Encounter Medications as of 08/18/2017  Medication Sig  . aspirin EC 81 MG tablet Take 81 mg by mouth daily as needed. For pain  . citalopram (CELEXA) 20 MG tablet Take 1 tablet (20 mg total) by mouth daily.  . cyproheptadine (PERIACTIN) 4 MG tablet Take 1 tablet (4 mg total) by mouth 3 (three) times daily as needed.  . fluticasone (FLONASE) 50 MCG/ACT nasal spray Place 2 sprays into both nostrils daily.  Marland Kitchen lisinopril (PRINIVIL,ZESTRIL) 10 MG tablet Take 1 tablet (10 mg total) by mouth daily.  . meclizine (ANTIVERT) 25 MG tablet  Take 25 mg by mouth 3 (three) times daily as needed for dizziness. Reported on 11/01/2015  . Omega-3 Fatty Acids (FISH OIL PO) Take by mouth.  . raloxifene (EVISTA) 60 MG tablet TAKE 1 TABLET BY MOUTH ONCE A DAY FOR BONES (Patient not taking: Reported on 08/18/2017)  . simvastatin (ZOCOR) 40 MG tablet TAKE 1 TABLET (40 MG TOTAL) BY MOUTH AT BEDTIME. (Patient not taking: Reported on 08/18/2017)  . Vitamin D, Ergocalciferol, (DRISDOL) 50000 units CAPS capsule Take 1 capsule (50,000 Units total) by mouth every 7 (seven) days. (Patient not taking: Reported on 08/18/2017)   No facility-administered encounter medications on file as of 08/18/2017.     Allergies (verified) Codeine and Darvocet [propoxyphene n-acetaminophen]   History: Past Medical History:  Diagnosis Date  . Anemia   . Anorexia   . Anxiety   . Arthritis   . Colon polyps   . Depression   . Dyspepsia   . Gastritis    found on EGD  . GERD (gastroesophageal reflux disease)   . Hyperlipidemia   . Hypertension   . Marital relationship problem   . Osteoporosis   . Postmenopausal   . Vertigo   . Vertigo due to previous cerebellar infarction    Past Surgical History:  Procedure Laterality Date  . BREAST SURGERY Left    biopsy benign  . CESAREAN SECTION    . COLONOSCOPY    . MOUTH SURGERY  bone extraction  . TUBAL LIGATION     Family History  Problem Relation Age of Onset  . Colon cancer Mother   . Diabetes Mother   . Cancer Mother   . Heart disease Mother   . Hypertension Mother   . Stroke Mother   . Cancer Brother        throat - smoker  . Kidney disease Brother   . Prostate cancer Brother   . Heart disease Sister   . Cancer Sister        breast  . Diabetes Sister   . Liver disease Brother   . Diabetes Brother   . Hypertension Brother   . Drug abuse Brother   . Stroke Father 74  . Diabetes Sister   . Heart disease Sister   . Hypertension Sister   . Diabetes Sister   . Cancer Sister   . Cancer Brother          prostate  . Stomach cancer Unknown        niece  . Kidney cancer Unknown        niece  . Cancer Daughter        kidney  . Alcohol abuse Brother   . Early death Sister   . Heart disease Sister   . Hypertension Sister   . Hypertension Sister   . Diabetes Sister   . Mental illness Daughter    Social History   Socioeconomic History  . Marital status: Married    Spouse name: Not on file  . Number of children: 5  . Years of education: 6  . Highest education level: 6th grade  Occupational History  . Occupation: disability/ retired    Fish farm manager: [employer]  Social Needs  . Financial resource strain: Not very hard  . Food insecurity:    Worry: Never true    Inability: Never true  . Transportation needs:    Medical: No    Non-medical: No  Tobacco Use  . Smoking status: Never Smoker  . Smokeless tobacco: Never Used  Substance and Sexual Activity  . Alcohol use: No  . Drug use: No  . Sexual activity: Yes  Lifestyle  . Physical activity:    Days per week: 2 days    Minutes per session: 30 min  . Stress: To some extent  Relationships  . Social connections:    Talks on phone: More than three times a week    Gets together: More than three times a week    Attends religious service: More than 4 times per year    Active member of club or organization: Yes    Attends meetings of clubs or organizations: More than 4 times per year    Relationship status: Married  Other Topics Concern  . Not on file  Social History Narrative  . Not on file    Tobacco Counseling Counseling given: Not Answered   Clinical Intake:     Pain : No/denies pain     Nutritional Status: BMI of 19-24  Normal Diabetes: No  How often do you need to have someone help you when you read instructions, pamphlets, or other written materials from your doctor or pharmacy?: 2 - Rarely What is the last grade level you completed in school?: 6th  Interpreter Needed?: No  Information entered by ::  Chong Sicilian, RN   Activities of Daily Living In your present state of health, do you have any difficulty performing the following activities: 08/18/2017  Hearing?  N  Vision? Y  Comment overdue  Difficulty concentrating or making decisions? N  Comment sometimes has trouble remembering what she went into a room for  Walking or climbing stairs? N  Dressing or bathing? N  Doing errands, shopping? Y  Comment doesn't drive but has people that are able to take her  Preparing Food and eating ? N  Using the Toilet? N  In the past six months, have you accidently leaked urine? N  Do you have problems with loss of bowel control? N  Managing your Medications? N  Managing your Finances? N  Housekeeping or managing your Housekeeping? N  Some recent data might be hidden     Immunizations and Health Maintenance Immunization History  Administered Date(s) Administered  . DTaP 07/12/2010  . Influenza,inj,Quad PF,6+ Mos 04/05/2013, 02/15/2015  . Influenza-Unspecified 02/23/2012  . Pneumococcal Conjugate-13 07/21/2014  . Pneumococcal Polysaccharide-23 12/23/2011  . Tdap 07/11/2010  . Zoster 11/22/2013   Health Maintenance Due  Topic Date Due  . COLONOSCOPY  09/23/2015    Patient Care Team: Sharion Balloon, FNP as PCP - General (Nurse Practitioner) Milus Banister, MD as Attending Physician (Gastroenterology) Rogene Houston, MD as Consulting Physician (Gastroenterology)  .No hospitalizations, ER visits, or surgeries this past year.      Assessment:   This is a routine wellness examination for [place].  Hearing/Vision screen No deficits noted during visit.  Dietary issues and exercise activities discussed: Current Exercise Habits: Home exercise routine, Type of exercise: walking, Time (Minutes): 20, Frequency (Times/Week): 3, Weekly Exercise (Minutes/Week): 60, Intensity: Mild, Exercise limited by: None identified  Goals    . Exercise 150 minutes per week (moderate activity)      Walk for at least 30 minutes 3 times per week    . Have 3 meals a day     Try to eat a balanced meal with lean proteins, fruits, and vegetables 3 times a day with a small healthy snack once or twice a day      Depression Screen PHQ 2/9 Scores 08/18/2017 08/11/2017 11/01/2015 05/01/2015 09/21/2014 07/21/2014 11/04/2013  PHQ - 2 Score 4 5 0 0 4 1 0  PHQ- 9 Score 11 17 - - 10 - -    Fall Risk Fall Risk  08/18/2017 08/11/2017 11/01/2015 04/16/2015 09/21/2014  Falls in the past year? No No No No Yes  Number falls in past yr: - - - - 1  Injury with Fall? - - - - No  Follow up - - - - Falls prevention discussed  Comment - - - - 74 year old grandson tripped her. Patient does have an outside dog. She does not have any throw rugs in her home.     Is the patient's home free of loose throw rugs in walkways, pet beds, electrical cords, etc?   yes      Grab bars in the bathroom? no but has a standup shower      Handrails on the stairs?   yes      Adequate lighting?   yes  Cognitive Function: MMSE - Mini Mental State Exam 08/18/2017 09/21/2014 07/21/2014  Orientation to time 4 5 5   Orientation to Place 5 5 5   Registration 3 3 3   Attention/ Calculation 5 5 5   Recall 1 2 3   Language- name 2 objects 2 2 2   Language- repeat 1 1 1   Language- follow 3 step command 3 3 3   Language- read & follow direction 1 1 1  Write a sentence 1 1 1   Copy design 0 1 1  Total score 26 - 30   Some difficulty with recall and one component of orientation. Unable to copy design exactly but it was close. May need further follow up considering she had a normal exam 3 years ago.    Screening Tests Health Maintenance  Topic Date Due  . COLONOSCOPY  09/23/2015  . INFLUENZA VACCINE  12/10/2017  . DEXA SCAN  12/18/2017  . MAMMOGRAM  04/01/2018  . COLON CANCER SCREENING ANNUAL FOBT  08/19/2018  . TETANUS/TDAP  07/10/2020  . Hepatitis C Screening  Completed  . PNA vac Low Risk Adult  Completed     Plan:  Stool softener three  times daily. Can switch to miralax after a week if this isn't helping. Drink 8 oz of water and take with a meal.  Increase water intake Plan 3 meals a day Exercise for at least 150 minutes a week Suggested MVI with magnesium and potassium-Women's One Day Keep in contact with family, friends, and Theme park manager. Talk about your problems with them. Seek help if needed.  Keep f/u with PCP. May want to follow up no MMSE Schedule eye exam Colonoscopy ordered  I have personally reviewed and noted the following in the patient's chart:   . Medical and social history . Use of alcohol, tobacco or illicit drugs  . Current medications and supplements . Functional ability and status . Nutritional status . Physical activity . Advanced directives . List of other physicians . Hospitalizations, surgeries, and ER visits in previous 12 months . Vitals . Screenings to include cognitive, depression, and falls . Referrals and appointments  In addition, I have reviewed and discussed with patient certain preventive protocols, quality metrics, and best practice recommendations. A written personalized care plan for preventive services as well as general preventive health recommendations were provided to patient.     Chong Sicilian, RN   08/19/17      I have reviewed and agree with the above AWV documentation.   Evelina Dun, FNP

## 2017-08-18 NOTE — Patient Instructions (Addendum)
Docusate sodium 100mg  three times a day with meals and 8 oz of water.  Schedule an eye exam Stay socially active and talk with friends and family often. If you need any additional help, let us or anyone that can help know.   A colonoscopy was ordered at Dr Olevia Perches office. You should hear back from them soon.    Kathryn Stein , Thank you for taking time to come for your Medicare Wellness Visit. I appreciate your ongoing commitment to your health goals. Please review the following plan we discussed and let me know if I can assist you in the future.   These are the goals we discussed: Goals    . Exercise 150 minutes per week (moderate activity)     Walk for at least 30 minutes 3 times per week    . Have 3 meals a day     Try to eat a balanced meal with lean proteins, fruits, and vegetables 3 times a day with a small healthy snack once or twice a day       This is a list of the screening recommended for you and due dates:  Health Maintenance  Topic Date Due  . Colon Cancer Screening  09/23/2015  . Stool Blood Test  11/07/2016  . Flu Shot  12/10/2017  . DEXA scan (bone density measurement)  12/18/2017  . Mammogram  04/01/2018  . Tetanus Vaccine  07/10/2020  .  Hepatitis C: One time screening is recommended by Center for Disease Control  (CDC) for  adults born from [birth year] through 1965.   Completed  . Pneumonia vaccines  Completed

## 2017-08-19 ENCOUNTER — Encounter (INDEPENDENT_AMBULATORY_CARE_PROVIDER_SITE_OTHER): Payer: Self-pay | Admitting: *Deleted

## 2017-08-19 LAB — FECAL OCCULT BLOOD, IMMUNOCHEMICAL: Fecal Occult Bld: NEGATIVE

## 2017-08-25 ENCOUNTER — Ambulatory Visit (INDEPENDENT_AMBULATORY_CARE_PROVIDER_SITE_OTHER): Payer: Medicare Other | Admitting: Family

## 2017-08-25 ENCOUNTER — Encounter: Payer: Self-pay | Admitting: Family

## 2017-08-25 VITALS — BP 136/73 | HR 69 | Temp 97.0°F | Ht 62.0 in | Wt 122.0 lb

## 2017-08-25 DIAGNOSIS — B372 Candidiasis of skin and nail: Secondary | ICD-10-CM | POA: Diagnosis not present

## 2017-08-25 DIAGNOSIS — F331 Major depressive disorder, recurrent, moderate: Secondary | ICD-10-CM | POA: Diagnosis not present

## 2017-08-25 DIAGNOSIS — I10 Essential (primary) hypertension: Secondary | ICD-10-CM

## 2017-08-25 MED ORDER — NYSTATIN 100000 UNIT/GM EX CREA: 1.0000 "application " | TOPICAL_CREAM | Freq: Two times a day (BID) | CUTANEOUS | 0 refills | Status: DC

## 2017-08-25 NOTE — Patient Instructions (Signed)

## 2017-08-25 NOTE — Progress Notes (Signed)
   Subjective:    Patient ID: Kathryn Stein, female    DOB: April 08, 1946, 72 y.o.   MRN: [id number]  Pt presents to the office today to recheck HTN. PT's BP is at goal today! Hypertension  This is a chronic problem. The current episode started more than 1 year ago. The problem has been resolved since onset. The problem is controlled. Associated symptoms include headaches ("every now and then") and malaise/fatigue. Pertinent negatives include no peripheral edema. The current treatment provides moderate improvement. There is no history of CAD/MI or heart failure.  Depression         This is a chronic problem.  The current episode started more than 1 year ago.   The onset quality is gradual.   The problem occurs intermittently.  The problem has been waxing and waning since onset.  Associated symptoms include headaches ("every now and then"). Rash  This is a new problem. The current episode started 1 to 4 weeks ago. The problem is unchanged. The affected locations include the left buttock and right buttock. The rash is characterized by pain and redness. Past treatments include nothing. The treatment provided no relief.      Review of Systems  Constitutional: Positive for malaise/fatigue.  Skin: Positive for rash.  Neurological: Positive for headaches ("every now and then").  Psychiatric/Behavioral: Positive for depression.  All other systems reviewed and are negative.      Objective:   Physical Exam  Constitutional: She is oriented to person, place, and time. She appears well-developed and well-nourished. No distress.  HENT:  Head: Normocephalic and atraumatic.  Right Ear: External ear normal.  Left Ear: External ear normal.  Nose: Nose normal.  Mouth/Throat: Oropharynx is clear and moist.  Eyes: Pupils are equal, round, and reactive to light.  Neck: Normal range of motion. Neck supple. No thyromegaly present.  Cardiovascular: Normal rate, regular rhythm, normal heart sounds and intact  distal pulses.  No murmur heard. Pulmonary/Chest: Effort normal and breath sounds normal. No respiratory distress. She has no wheezes.  Abdominal: Soft. Bowel sounds are normal. She exhibits no distension. There is no tenderness.  Musculoskeletal: Normal range of motion. She exhibits no edema or tenderness.  Neurological: She is alert and oriented to person, place, and time.  Skin: Skin is warm and dry. Rash noted.  Erythemas and tenderness at peak of gluteal fold  Psychiatric: She has a normal mood and affect. Her behavior is normal. Judgment and thought content normal.  Vitals reviewed.     BP 136/73   Pulse 69   Temp (!) 97 F (36.1 C) (Oral)   Ht '5\' 2"'$  (1.575 m)   Wt 122 lb (55.3 kg)   BMI 22.31 kg/m      Assessment & Plan:  1. Essential hypertension, benign -Dash diet information given -Exercise encouraged - Stress Management  -Continue current meds -RTO in 3 months  - BMP8+EGFR  2. Moderate episode of recurrent major depressive disorder (HCC) Continue Celexa  - BMP8+EGFR  3. Yeast dermatitis Keep clean and dry - BMP8+EGFR - nystatin cream (MYCOSTATIN); Apply 1 application topically 2 (two) times daily.  Dispense: 30 g; Refill: 0   Continue all meds Labs pending Health Maintenance reviewed Diet and exercise encouraged RTO 3 months   Evelina Dun, FNP

## 2017-08-26 LAB — BMP8+EGFR
BUN/Creatinine Ratio: 17 (ref 12–28)
BUN: 11 mg/dL (ref 8–27)
CALCIUM: 9.7 mg/dL (ref 8.7–10.3)
CO2: 30 mmol/L — AB (ref 20–29)
Chloride: 102 mmol/L (ref 96–106)
Creatinine, Ser: 0.66 mg/dL (ref 0.57–1.00)
GFR calc Af Amer: 103 mL/min/{1.73_m2} (ref >59)
GFR, EST NON AFRICAN AMERICAN: 89 mL/min/{1.73_m2} (ref >59)
Glucose: 70 mg/dL (ref 65–99)
POTASSIUM: 3.8 mmol/L (ref 3.5–5.2)
Sodium: 145 mmol/L — ABNORMAL HIGH (ref 134–144)

## 2017-11-26 ENCOUNTER — Ambulatory Visit (INDEPENDENT_AMBULATORY_CARE_PROVIDER_SITE_OTHER): Payer: Medicare Other | Admitting: Family

## 2017-11-26 ENCOUNTER — Encounter: Payer: Self-pay | Admitting: Family

## 2017-11-26 VITALS — BP 172/97 | HR 62 | Temp 97.2°F | Ht 62.0 in | Wt 123.0 lb

## 2017-11-26 DIAGNOSIS — M545 Low back pain: Secondary | ICD-10-CM | POA: Diagnosis not present

## 2017-11-26 DIAGNOSIS — R636 Underweight: Secondary | ICD-10-CM | POA: Diagnosis not present

## 2017-11-26 DIAGNOSIS — I10 Essential (primary) hypertension: Secondary | ICD-10-CM | POA: Diagnosis not present

## 2017-11-26 DIAGNOSIS — E559 Vitamin D deficiency, unspecified: Secondary | ICD-10-CM

## 2017-11-26 DIAGNOSIS — F331 Major depressive disorder, recurrent, moderate: Secondary | ICD-10-CM

## 2017-11-26 DIAGNOSIS — E785 Hyperlipidemia, unspecified: Secondary | ICD-10-CM

## 2017-11-26 DIAGNOSIS — Z1211 Encounter for screening for malignant neoplasm of colon: Secondary | ICD-10-CM

## 2017-11-26 DIAGNOSIS — M810 Age-related osteoporosis without current pathological fracture: Secondary | ICD-10-CM | POA: Diagnosis not present

## 2017-11-26 MED ORDER — RALOXIFENE HCL 60 MG PO TABS: ORAL_TABLET | ORAL | 1 refills | Status: DC

## 2017-11-26 MED ORDER — CYPROHEPTADINE HCL 4 MG PO TABS: 4.0000 mg | ORAL_TABLET | Freq: Three times a day (TID) | ORAL | 3 refills | Status: DC | PRN

## 2017-11-26 NOTE — Progress Notes (Signed)
Subjective:    Patient ID: Kathryn Stein, Kathryn Stein    DOB: 09/09/1945, 72 y.o.   MRN: [id number]  Chief Complaint  Patient presents with  . Medical Management of Chronic Issues    three month recheck   PT presents to the office today for chronic follow up. Pt states she has not taken any of her medications this morning because she forgot. Her BP is elevated today.  Hypertension  This is a chronic problem. The current episode started more than 1 year ago. The problem has been waxing and waning since onset. The problem is uncontrolled. Pertinent negatives include no malaise/fatigue, peripheral edema or shortness of breath. Risk factors for coronary artery disease include dyslipidemia. The current treatment provides moderate improvement. There is no history of kidney disease, CAD/MI or heart failure.  Hyperlipidemia  This is a chronic problem. The current episode started more than 1 year ago. The problem is uncontrolled. Recent lipid tests were reviewed and are high. Pertinent negatives include no shortness of breath. Current antihyperlipidemic treatment includes statins. The current treatment provides moderate improvement of lipids. Risk factors for coronary artery disease include dyslipidemia, post-menopausal and a sedentary lifestyle.  Depression         This is a chronic problem.  The current episode started more than 1 year ago.   The onset quality is gradual.   The problem occurs intermittently.  The problem has been waxing and waning since onset.  Associated symptoms include sad.  Associated symptoms include no helplessness, no hopelessness, no restlessness and no decreased interest.  Past treatments include SSRIs - Selective serotonin reuptake inhibitors. Osteoporosis PT states she was taking Evista daily, but has not had refills in "awhile".    Review of Systems  Constitutional: Negative for malaise/fatigue.  Respiratory: Negative for shortness of breath.   Psychiatric/Behavioral:  Positive for depression.  All other systems reviewed and are negative.      Objective:   Physical Exam  Constitutional: She is oriented to person, place, and time. She appears well-developed and well-nourished. No distress.  HENT:  Head: Normocephalic and atraumatic.  Right Ear: External ear normal.  Left Ear: External ear normal.  Mouth/Throat: Oropharynx is clear and moist.  Eyes: Pupils are equal, round, and reactive to light.  Neck: Normal range of motion. Neck supple. No thyromegaly present.  Cardiovascular: Normal rate, regular rhythm, normal heart sounds and intact distal pulses.  No murmur heard. Pulmonary/Chest: Effort normal and breath sounds normal. No respiratory distress. She has no wheezes.  Abdominal: Soft. Bowel sounds are normal. She exhibits no distension. There is no tenderness.  Musculoskeletal: Normal range of motion. She exhibits no edema or tenderness.  Neurological: She is alert and oriented to person, place, and time. She has normal reflexes. No cranial nerve deficit.  Skin: Skin is warm and dry.  Psychiatric: She has a normal mood and affect. Her behavior is normal. Judgment and thought content normal.  Vitals reviewed.    BP (!) 172/97   Pulse 62   Temp (!) 97.2 F (36.2 C) (Oral)   Ht '5\' 2"'$  (1.575 m)   Wt 123 lb (55.8 kg)   BMI 22.50 kg/m      Assessment & Plan:  Kathryn Stein comes in today with chief complaint of Medical Management of Chronic Issues (three month recheck)   Diagnosis and orders addressed:  1. Hyperlipidemia, unspecified hyperlipidemia type - CMP14+EGFR - Lipid panel - CBC with Differential/Platelet  2. Moderate episode of recurrent major  depressive disorder ([place]) - CMP14+EGFR - CBC with Differential/Platelet  3. Essential hypertension, benign - CMP14+EGFR - CBC with Differential/Platelet  4. Low back pain, unspecified back pain laterality, unspecified chronicity, with sciatica presence unspecified - CMP14+EGFR -  CBC with Differential/Platelet  5. Underweight - CMP14+EGFR - CBC with Differential/Platelet  6. Osteoporosis without current pathological fracture, unspecified osteoporosis type - CMP14+EGFR - CBC with Differential/Platelet - VITAMIN D 25 Hydroxy (Vit-D Deficiency, Fractures)  7. Vitamin D deficiency - CMP14+EGFR - CBC with Differential/Platelet - VITAMIN D 25 Hydroxy (Vit-D Deficiency, Fractures)  8. Colon cancer screening - CMP14+EGFR - CBC with Differential/Platelet - Ambulatory referral to Gastroenterology  9. Osteoporosis - raloxifene (EVISTA) 60 MG tablet; TAKE 1 TABLET BY MOUTH ONCE A DAY FOR BONES  Dispense: 90 tablet; Refill: 1   Labs pending Health Maintenance reviewed Diet and exercise encouraged  Follow up plan: 1 month to recheck BP, pt told to take medications before visit   Evelina Dun, FNP

## 2017-11-26 NOTE — Patient Instructions (Signed)
Failure to Thrive, Adult Failure to thrive is a group of problems. These problems include eating too little and losing weight. People who have this condition may do fewer and fewer activities over time. They may lose interest in being with friends or they may not want to eat or drink. Follow these instructions at home:  Take medicines only as told by your doctor.  Eat a healthy, well-balanced diet. Make sure that you eat enough.  Be active. Do strength training. A physical therapist can help to set up an exercise program that fits you.  Make sure that you are safe at home.  Make sure that you have a plan for what to do if you cannot make decisions for yourself. Contact a doctor if:  You are not able to eat well.  You are not able to move around.  You feel very sad.  You feel very hopeless. Get help right away if:  You think about ending your life.  You cannot eat or drink.  You do not get out of bed.  Staying at home is not safe.  You have a fever. This information is not intended to replace advice given to you by your health care provider. Make sure you discuss any questions you have with your health care provider. Document Released: 04/17/2011 Document Revised: 10/04/2015 Document Reviewed: 07/24/2014 Elsevier Interactive Patient Education  2018 [employer]  

## 2017-11-27 ENCOUNTER — Other Ambulatory Visit: Payer: Self-pay | Admitting: Family

## 2017-11-27 LAB — CMP14+EGFR
ALT: 12 IU/L (ref 0–32)
AST: 16 IU/L (ref 0–40)
Albumin/Globulin Ratio: 2.4 — ABNORMAL HIGH (ref 1.2–2.2)
Albumin: 4.5 g/dL (ref 3.5–4.8)
Alkaline Phosphatase: 62 IU/L (ref 39–117)
BUN/Creatinine Ratio: 20 (ref 12–28)
BUN: 15 mg/dL (ref 8–27)
Bilirubin Total: 0.5 mg/dL (ref 0.0–1.2)
CALCIUM: 9.6 mg/dL (ref 8.7–10.3)
CO2: 25 mmol/L (ref 20–29)
CREATININE: 0.76 mg/dL (ref 0.57–1.00)
Chloride: 103 mmol/L (ref 96–106)
GFR, EST AFRICAN AMERICAN: 91 mL/min/{1.73_m2} (ref >59)
GFR, EST NON AFRICAN AMERICAN: 79 mL/min/{1.73_m2} (ref >59)
GLUCOSE: 71 mg/dL (ref 65–99)
Globulin, Total: 1.9 g/dL (ref 1.5–4.5)
Potassium: 4.4 mmol/L (ref 3.5–5.2)
Sodium: 142 mmol/L (ref 134–144)
TOTAL PROTEIN: 6.4 g/dL (ref 6.0–8.5)

## 2017-11-27 LAB — LIPID PANEL
CHOL/HDL RATIO: 2.4 ratio (ref 0.0–4.4)
Cholesterol, Total: 208 mg/dL — ABNORMAL HIGH (ref 100–199)
HDL: 85 mg/dL (ref >39)
LDL CALC: 112 mg/dL — AB (ref 0–99)
Triglycerides: 57 mg/dL (ref 0–149)
VLDL CHOLESTEROL CAL: 11 mg/dL (ref 5–40)

## 2017-11-27 LAB — CBC WITH DIFFERENTIAL/PLATELET
BASOS ABS: 0 10*3/uL (ref 0.0–0.2)
Basos: 1 %
EOS (ABSOLUTE): 0 10*3/uL (ref 0.0–0.4)
EOS: 1 %
HEMATOCRIT: 37.1 % (ref 34.0–46.6)
Hemoglobin: 11.8 g/dL (ref 11.1–15.9)
Immature Grans (Abs): 0 10*3/uL (ref 0.0–0.1)
Immature Granulocytes: 0 %
LYMPHS ABS: 1.9 10*3/uL (ref 0.7–3.1)
Lymphs: 40 %
MCH: 29.5 pg (ref 26.6–33.0)
MCHC: 31.8 g/dL (ref 31.5–35.7)
MCV: 93 fL (ref 79–97)
MONOS ABS: 0.3 10*3/uL (ref 0.1–0.9)
Monocytes: 7 %
Neutrophils Absolute: 2.4 10*3/uL (ref 1.4–7.0)
Neutrophils: 51 %
Platelets: 316 10*3/uL (ref 150–450)
RBC: 4 x10E6/uL (ref 3.77–5.28)
RDW: 13.7 % (ref 12.3–15.4)
WBC: 4.7 10*3/uL (ref 3.4–10.8)

## 2017-11-27 LAB — VITAMIN D 25 HYDROXY (VIT D DEFICIENCY, FRACTURES): Vit D, 25-Hydroxy: 54.2 ng/mL (ref 30.0–100.0)

## 2017-11-27 MED ORDER — ATORVASTATIN CALCIUM 20 MG PO TABS: 20.0000 mg | ORAL_TABLET | Freq: Every day | ORAL | 11 refills | Status: DC

## 2017-12-03 ENCOUNTER — Encounter: Payer: Self-pay | Admitting: *Deleted

## 2017-12-16 NOTE — Progress Notes (Signed)
Erroneous encounter - patient is not due for AWV until after 08/19/2018

## 2017-12-17 ENCOUNTER — Other Ambulatory Visit: Payer: Self-pay | Admitting: *Deleted

## 2017-12-28 ENCOUNTER — Ambulatory Visit: Payer: Medicare Other | Admitting: Family

## 2017-12-28 ENCOUNTER — Encounter: Payer: Self-pay | Admitting: Gastroenterology

## 2018-01-07 ENCOUNTER — Encounter: Payer: Self-pay | Admitting: Family

## 2018-01-07 ENCOUNTER — Ambulatory Visit (INDEPENDENT_AMBULATORY_CARE_PROVIDER_SITE_OTHER): Payer: Medicare Other | Admitting: Family

## 2018-01-07 VITALS — BP 141/85 | HR 55 | Temp 97.0°F | Ht 62.0 in | Wt 126.8 lb

## 2018-01-07 DIAGNOSIS — I10 Essential (primary) hypertension: Secondary | ICD-10-CM | POA: Diagnosis not present

## 2018-01-07 LAB — BMP8+EGFR
BUN / CREAT RATIO: 20 (ref 12–28)
BUN: 18 mg/dL (ref 8–27)
CHLORIDE: 100 mmol/L (ref 96–106)
CO2: 30 mmol/L — ABNORMAL HIGH (ref 20–29)
Calcium: 10 mg/dL (ref 8.7–10.3)
Creatinine, Ser: 0.89 mg/dL (ref 0.57–1.00)
GFR calc Af Amer: 75 mL/min/{1.73_m2} (ref >59)
GFR calc non Af Amer: 65 mL/min/{1.73_m2} (ref >59)
GLUCOSE: 57 mg/dL — AB (ref 65–99)
POTASSIUM: 4.3 mmol/L (ref 3.5–5.2)
SODIUM: 142 mmol/L (ref 134–144)

## 2018-01-07 NOTE — Patient Instructions (Signed)

## 2018-01-07 NOTE — Progress Notes (Signed)
   Subjective:    Patient ID: Kathryn Stein, female    DOB: 1945-07-07, 72 y.o.   MRN: [id number]  Chief Complaint  Patient presents with  . Hypertension    one month recheck   PT presents to the office today to recheck HTN. PT's BP is stable today.  Hypertension  This is a chronic problem. The current episode started more than 1 year ago. The problem has been waxing and waning since onset. The problem is uncontrolled. Pertinent negatives include no malaise/fatigue, peripheral edema or shortness of breath. The current treatment provides moderate improvement.      Review of Systems  Constitutional: Negative for malaise/fatigue.  Respiratory: Negative for shortness of breath.   All other systems reviewed and are negative.      Objective:   Physical Exam  Constitutional: She is oriented to person, place, and time. She appears well-developed and well-nourished. No distress.  HENT:  Head: Normocephalic and atraumatic.  Right Ear: External ear normal.  Left Ear: External ear normal.  Mouth/Throat: Oropharynx is clear and moist.  Eyes: Pupils are equal, round, and reactive to light.  Neck: Normal range of motion. Neck supple. No thyromegaly present.  Cardiovascular: Normal rate, regular rhythm, normal heart sounds and intact distal pulses.  No murmur heard. Pulmonary/Chest: Effort normal and breath sounds normal. No respiratory distress. She has no wheezes.  Abdominal: Soft. Bowel sounds are normal. She exhibits no distension. There is no tenderness.  Musculoskeletal: Normal range of motion. She exhibits no edema or tenderness.  Neurological: She is alert and oriented to person, place, and time. She has normal reflexes. No cranial nerve deficit.  Skin: Skin is warm and dry.  Psychiatric: She has a normal mood and affect. Her behavior is normal. Judgment and thought content normal.  Vitals reviewed.     BP (!) 141/85   Pulse (!) 55   Temp (!) 97 F (36.1 C) (Oral)   Ht 5'  2" (1.575 m)   Wt 126 lb 12.8 oz (57.5 kg)   BMI 23.19 kg/m      Assessment & Plan:  Kathryn Stein comes in today with chief complaint of Hypertension (one month recheck)   Diagnosis and orders addressed:  1. Essential hypertension, benign -Dash diet information given -Exercise encouraged - Stress Management  -Continue current meds -RTO in 3-4 months  - BMP8+EGFR   Labs pending Health Maintenance reviewed Diet and exercise encouraged  Follow up plan: 3-4 months   Evelina Dun, FNP

## 2018-02-12 ENCOUNTER — Encounter: Payer: Self-pay | Admitting: Gastroenterology

## 2018-02-12 ENCOUNTER — Ambulatory Visit (AMBULATORY_SURGERY_CENTER): Payer: Self-pay

## 2018-02-12 DIAGNOSIS — Z800 Family history of malignant neoplasm of digestive organs: Secondary | ICD-10-CM

## 2018-02-12 DIAGNOSIS — Z86010 Personal history of colonic polyps: Secondary | ICD-10-CM

## 2018-02-12 MED ORDER — PEG 3350-KCL-NA BICARB-NACL 420 G PO SOLR: 4000.0000 mL | Freq: Once | ORAL | 0 refills | Status: AC

## 2018-02-12 NOTE — Progress Notes (Signed)
During the PV, pt states she did not remember some of the medications she was on. She will bring a list of medications on the day of the procedure.  Pt will call our office back, if she is taking any blood thinners. Per pt, no allergies to soy or egg products.Pt not taking any weight loss meds or using  O2 at home.  Pt refused emmi video.

## 2018-02-26 ENCOUNTER — Ambulatory Visit (AMBULATORY_SURGERY_CENTER): Payer: Medicare Other | Admitting: Gastroenterology

## 2018-02-26 ENCOUNTER — Encounter: Payer: Self-pay | Admitting: Gastroenterology

## 2018-02-26 VITALS — BP 132/77 | HR 60 | Temp 97.1°F | Resp 11 | Ht 62.0 in | Wt 134.0 lb

## 2018-02-26 DIAGNOSIS — Z86010 Personal history of colonic polyps: Secondary | ICD-10-CM | POA: Diagnosis not present

## 2018-02-26 DIAGNOSIS — Z1211 Encounter for screening for malignant neoplasm of colon: Secondary | ICD-10-CM | POA: Diagnosis not present

## 2018-02-26 DIAGNOSIS — Z800 Family history of malignant neoplasm of digestive organs: Secondary | ICD-10-CM

## 2018-02-26 MED ORDER — SODIUM CHLORIDE 0.9 % IV SOLN: 500.0000 mL | Freq: Once | INTRAVENOUS | Status: DC

## 2018-02-26 NOTE — Op Note (Signed)
[facility] Patient Name: Kathryn Stein Procedure Date: 02/26/2018 8:17 AM MRN: [id number] Endoscopist: Milus Banister , MD Age: 72 Referring MD:  Date of Birth: April 09, 1946 Gender: Female Account #: [id number] Procedure:                Colonoscopy Indications:              High risk colon cancer surveillance: Personal                            history of colonic polyps; colonoscopy 2012 single                            subCM adenoma Medicines:                Monitored Anesthesia Care Procedure:                Pre-Anesthesia Assessment:                           - Prior to the procedure, a History and Physical                            was performed, and patient medications and                            allergies were reviewed. The patient's tolerance of                            previous anesthesia was also reviewed. The risks                            and benefits of the procedure and the sedation                            options and risks were discussed with the patient.                            All questions were answered, and informed consent                            was obtained. Prior Anticoagulants: The patient has                            taken no previous anticoagulant or antiplatelet                            agents. ASA Grade Assessment: II - A patient with                            mild systemic disease. After reviewing the risks                            and benefits, the patient was deemed in  satisfactory condition to undergo the procedure.                           After obtaining informed consent, the colonoscope                            was passed under direct vision. Throughout the                            procedure, the patient's blood pressure, pulse, and                            oxygen saturations were monitored continuously. The                            Colonoscope was introduced through the anus and                             advanced to the the cecum, identified by                            appendiceal orifice and ileocecal valve. The                            colonoscopy was performed without difficulty. The                            patient tolerated the procedure well. The quality                            of the bowel preparation was good. The ileocecal                            valve, appendiceal orifice, and rectum were                            photographed. Scope In: 8:25:13 AM Scope Out: 8:37:19 AM Scope Withdrawal Time: 0 hours 7 minutes 22 seconds  Total Procedure Duration: 0 hours 12 minutes 6 seconds  Findings:                 Small internal hemorrhoids.                           The entire examined colon appeared normal on direct                            and retroflexion views. Complications:            No immediate complications. Estimated blood loss:                            None. Estimated Blood Loss:     Estimated blood loss: none. Impression:               - Small internal hemorrhoids.                           -  The entire examined colon is normal on direct and                            retroflexion views.                           - No polyps or cancers. Recommendation:           - Patient has a contact number available for                            emergencies. The signs and symptoms of potential                            delayed complications were discussed with the                            patient. Return to normal activities tomorrow.                            Written discharge instructions were provided to the                            patient.                           - Resume previous diet.                           - Continue present medications.                           - You do not need any further colon cancer                            screening tests (including stool testing). These                            types of tests  generally stop around age 31-80. Milus Banister, MD 02/26/2018 8:39:57 AM This report has been signed electronically.

## 2018-02-26 NOTE — Patient Instructions (Signed)
HANDOUT GIVEN FOR HEMORRHOIDS  YOU HAD AN ENDOSCOPIC PROCEDURE TODAY AT [facility]:   Refer to the procedure report that was given to you for any specific questions about what was found during the examination.  If the procedure report does not answer your questions, please call your gastroenterologist to clarify.  If you requested that your care partner not be given the details of your procedure findings, then the procedure report has been included in a sealed envelope for you to review at your convenience later.  YOU SHOULD EXPECT: Some feelings of bloating in the abdomen. Passage of more gas than usual.  Walking can help get rid of the air that was put into your GI tract during the procedure and reduce the bloating. If you had a lower endoscopy (such as a colonoscopy or flexible sigmoidoscopy) you may notice spotting of blood in your stool or on the toilet paper. If you underwent a bowel prep for your procedure, you may not have a normal bowel movement for a few days.  Please Note:  You might notice some irritation and congestion in your nose or some drainage.  This is from the oxygen used during your procedure.  There is no need for concern and it should clear up in a day or so.  SYMPTOMS TO REPORT IMMEDIATELY:   Following lower endoscopy (colonoscopy or flexible sigmoidoscopy):  Excessive amounts of blood in the stool  Significant tenderness or worsening of abdominal pains  Swelling of the abdomen that is new, acute  Fever of 100F or higher  For urgent or emergent issues, a gastroenterologist can be reached at any hour by calling [phone].   DIET:  We do recommend a small meal at first, but then you may proceed to your regular diet.  Drink plenty of fluids but you should avoid alcoholic beverages for 24 hours.  ACTIVITY:  You should plan to take it easy for the rest of today and you should NOT DRIVE or use heavy machinery until tomorrow (because of the sedation  medicines used during the test).    FOLLOW UP: Our staff will call the number listed on your records the next business day following your procedure to check on you and address any questions or concerns that you may have regarding the information given to you following your procedure. If we do not reach you, we will leave a message.  However, if you are feeling well and you are not experiencing any problems, there is no need to return our call.  We will assume that you have returned to your regular daily activities without incident.  If any biopsies were taken you will be contacted by phone or by letter within the next 1-3 weeks.  Please call us at [phone] if you have not heard about the biopsies in 3 weeks.    SIGNATURES/CONFIDENTIALITY: You and/or your care partner have signed paperwork which will be entered into your electronic medical record.  These signatures attest to the fact that that the information above on your After Visit Summary has been reviewed and is understood.  Full responsibility of the confidentiality of this discharge information lies with you and/or your care-partner.

## 2018-02-26 NOTE — Progress Notes (Signed)
Pt's states no medical or surgical changes since previsit or office visit. 

## 2018-02-26 NOTE — Progress Notes (Signed)
To PACU, VSS. Report to [email or web address] 

## 2018-03-01 ENCOUNTER — Telehealth: Payer: Self-pay

## 2018-03-01 NOTE — Telephone Encounter (Signed)
  Follow up Call-  Call back number 02/26/2018  Post procedure Call Back phone  # [phone]  Permission to leave phone message Yes  Some recent data might be hidden     Patient questions:  Do you have a fever, pain , or abdominal swelling? No. Pain Score  0 *  Have you tolerated food without any problems? Yes.    Have you been able to return to your normal activities? Yes.    Do you have any questions about your discharge instructions: Diet   No. Medications  No. Follow up visit  No.  Do you have questions or concerns about your Care? No.  Actions: * If pain score is 4 or above: No action needed, pain <4.

## 2018-04-12 ENCOUNTER — Ambulatory Visit (INDEPENDENT_AMBULATORY_CARE_PROVIDER_SITE_OTHER): Payer: Medicare Other | Admitting: Family

## 2018-04-12 ENCOUNTER — Ambulatory Visit (INDEPENDENT_AMBULATORY_CARE_PROVIDER_SITE_OTHER): Payer: Medicare Other

## 2018-04-12 ENCOUNTER — Encounter: Payer: Self-pay | Admitting: Family

## 2018-04-12 VITALS — BP 158/87 | HR 56 | Temp 97.1°F | Ht 62.0 in | Wt 134.0 lb

## 2018-04-12 DIAGNOSIS — I10 Essential (primary) hypertension: Secondary | ICD-10-CM

## 2018-04-12 DIAGNOSIS — Z780 Asymptomatic menopausal state: Secondary | ICD-10-CM | POA: Diagnosis not present

## 2018-04-12 DIAGNOSIS — M810 Age-related osteoporosis without current pathological fracture: Secondary | ICD-10-CM

## 2018-04-12 DIAGNOSIS — F331 Major depressive disorder, recurrent, moderate: Secondary | ICD-10-CM | POA: Diagnosis not present

## 2018-04-12 DIAGNOSIS — E785 Hyperlipidemia, unspecified: Secondary | ICD-10-CM | POA: Diagnosis not present

## 2018-04-12 DIAGNOSIS — E559 Vitamin D deficiency, unspecified: Secondary | ICD-10-CM

## 2018-04-12 DIAGNOSIS — M85851 Other specified disorders of bone density and structure, right thigh: Secondary | ICD-10-CM | POA: Diagnosis not present

## 2018-04-12 MED ORDER — CITALOPRAM HYDROBROMIDE 40 MG PO TABS: 40.0000 mg | ORAL_TABLET | Freq: Every day | ORAL | 5 refills | Status: DC

## 2018-04-12 NOTE — Patient Instructions (Signed)
Living With Depression Everyone experiences occasional disappointment, sadness, and loss in their lives. When you are feeling down, blue, or sad for at least 2 weeks in a row, it may mean that you have depression. Depression can affect your thoughts and feelings, relationships, daily activities, and physical health. It is caused by changes in the way your brain functions. If you receive a diagnosis of depression, your health care provider will tell you which type of depression you have and what treatment options are available to you. If you are living with depression, there are ways to help you recover from it and also ways to prevent it from coming back. How to cope with lifestyle changes Coping with stress Stress is your body's reaction to life changes and events, both good and bad. Stressful situations may include:  Getting married.  The death of a spouse.  Losing a job.  Retiring.  Having a baby.  Stress can last just a few hours or it can be ongoing. Stress can play a major role in depression, so it is important to learn both how to cope with stress and how to think about it differently. Talk with your health care provider or a counselor if you would like to learn more about stress reduction. He or she may suggest some stress reduction techniques, such as:  Music therapy. This can include creating music or listening to music. Choose music that you enjoy and that inspires you.  Mindfulness-based meditation. This kind of meditation can be done while sitting or walking. It involves being aware of your normal breaths, rather than trying to control your breathing.  Centering prayer. This is a kind of meditation that involves focusing on a spiritual word or phrase. Choose a word, phrase, or sacred image that is meaningful to you and that brings you peace.  Deep breathing. To do this, expand your stomach and inhale slowly through your nose. Hold your breath for 3-5 seconds, then exhale  slowly, allowing your stomach muscles to relax.  Muscle relaxation. This involves intentionally tensing muscles then relaxing them.  Choose a stress reduction technique that fits your lifestyle and personality. Stress reduction techniques take time and practice to develop. Set aside 5-15 minutes a day to do them. Therapists can offer training in these techniques. The training may be covered by some insurance plans. Other things you can do to manage stress include:  Keeping a stress diary. This can help you learn what triggers your stress and ways to control your response.  Understanding what your limits are and saying no to requests or events that lead to a schedule that is too full.  Thinking about how you respond to certain situations. You may not be able to control everything, but you can control how you react.  Adding humor to your life by watching funny films or TV shows.  Making time for activities that help you relax and not feeling guilty about spending your time this way.  Medicines Your health care provider may suggest certain medicines if he or she feels that they will help improve your condition. Avoid using alcohol and other substances that may prevent your medicines from working properly (may interact). It is also important to:  Talk with your pharmacist or health care provider about all the medicines that you take, their possible side effects, and what medicines are safe to take together.  Make it your goal to take part in all treatment decisions (shared decision-making). This includes giving input on the side   effects of medicines. It is best if shared decision-making with your health care provider is part of your total treatment plan.  If your health care provider prescribes a medicine, you may not notice the full benefits of it for 4-8 weeks. Most people who are treated for depression need to be on medicine for at least 6-12 months after they feel better. If you are taking  medicines as part of your treatment, do not stop taking medicines without first talking to your health care provider. You may need to have the medicine slowly decreased (tapered) over time to decrease the risk of harmful side effects. Relationships Your health care provider may suggest family therapy along with individual therapy and drug therapy. While there may not be family problems that are causing you to feel depressed, it is still important to make sure your family learns as much as they can about your mental health. Having your family's support can help make your treatment successful. How to recognize changes in your condition Everyone has a different response to treatment for depression. Recovery from major depression happens when you have not had signs of major depression for two months. This may mean that you will start to:  Have more interest in doing activities.  Feel less hopeless than you did 2 months ago.  Have more energy.  Overeat less often, or have better or improving appetite.  Have better concentration.  Your health care provider will work with you to decide the next steps in your recovery. It is also important to recognize when your condition is getting worse. Watch for these signs:  Having fatigue or low energy.  Eating too much or too little.  Sleeping too much or too little.  Feeling restless, agitated, or hopeless.  Having trouble concentrating or making decisions.  Having unexplained physical complaints.  Feeling irritable, angry, or aggressive.  Get help as soon as you or your family members notice these symptoms coming back. How to get support and help from others How to talk with friends and family members about your condition Talking to friends and family members about your condition can provide you with one way to get support and guidance. Reach out to trusted friends or family members, explain your symptoms to them, and let them know that you are  working with a health care provider to treat your depression. Financial resources Not all insurance plans cover mental health care, so it is important to check with your insurance carrier. If paying for co-pays or counseling services is a problem, search for a local or county mental health care center. They may be able to offer public mental health care services at low or no cost when you are not able to see a private health care provider. If you are taking medicine for depression, you may be able to get the generic form, which may be less expensive. Some makers of prescription medicines also offer help to patients who cannot afford the medicines they need. Follow these instructions at home:  Get the right amount and quality of sleep.  Cut down on using caffeine, tobacco, alcohol, and other potentially harmful substances.  Try to exercise, such as walking or lifting small weights.  Take over-the-counter and prescription medicines only as told by your health care provider.  Eat a healthy diet that includes plenty of vegetables, fruits, whole grains, low-fat dairy products, and lean protein. Do not eat a lot of foods that are high in solid fats, added sugars, or salt.    Keep all follow-up visits as told by your health care provider. This is important. Contact a health care provider if:  You stop taking your antidepressant medicines, and you have any of these symptoms: ? Nausea. ? Headache. ? Feeling lightheaded. ? Chills and body aches. ? Not being able to sleep (insomnia).  You or your friends and family think your depression is getting worse. Get help right away if:  You have thoughts of hurting yourself or others. If you ever feel like you may hurt yourself or others, or have thoughts about taking your own life, get help right away. You can go to your nearest emergency department or call:  Your local emergency services (911 in the [place]  A suicide crisis helpline, such as the  National Suicide Prevention Lifeline at 1-800-273-8255. This is open 24-hours a day.  Summary  If you are living with depression, there are ways to help you recover from it and also ways to prevent it from coming back.  Work with your health care team to create a management plan that includes counseling, stress management techniques, and healthy lifestyle habits. This information is not intended to replace advice given to you by your health care provider. Make sure you discuss any questions you have with your health care provider. Document Released: 03/31/2016 Document Revised: 03/31/2016 Document Reviewed: 03/31/2016 Elsevier Interactive Patient Education  2018 [employer]  

## 2018-04-12 NOTE — Progress Notes (Signed)
Subjective:    Patient ID: Kathryn Stein, female    DOB: [birth year], 72 y.o.   MRN: [id number]  Chief Complaint  Patient presents with  . Medical Management of Chronic Issues   PT presents to the office today for chronic follow up. Pt states she has not taken any of her medications this morning because she forgot. Her BP is elevated today.   She reports her depression is worse recently because she is worried about her great grandson going into foster care and issues with her husband.  Hypertension  This is a chronic problem. The current episode started more than 1 year ago. The problem has been waxing and waning since onset. The problem is uncontrolled. Associated symptoms include malaise/fatigue. Pertinent negatives include no peripheral edema or shortness of breath. Risk factors for coronary artery disease include dyslipidemia and sedentary lifestyle.  Hyperlipidemia  This is a chronic problem. The current episode started more than 1 year ago. Recent lipid tests were reviewed and are high. Pertinent negatives include no shortness of breath. Current antihyperlipidemic treatment includes statins. The current treatment provides moderate improvement of lipids. Risk factors for coronary artery disease include dyslipidemia, hypertension and a sedentary lifestyle.  Back Pain  This is a chronic problem. The current episode started more than 1 year ago. The problem occurs intermittently. The problem has been waxing and waning since onset. The pain is present in the lumbar spine and gluteal. The quality of the pain is described as aching. The pain is at a severity of 8/10. The pain is moderate.  Depression         This is a chronic problem.  The current episode started more than 1 year ago.   The onset quality is gradual.   The problem occurs intermittently.  The problem has been waxing and waning since onset.  Associated symptoms include irritable, restlessness, decreased interest and sad.     The  symptoms are aggravated by family issues.  Past treatments include SSRIs - Selective serotonin reuptake inhibitors. Osteoporosis  Pt taking Evista daily. Last Dexascan was 07/12/13.    Review of Systems  Constitutional: Positive for malaise/fatigue.  Respiratory: Negative for shortness of breath.   Musculoskeletal: Positive for back pain.  Psychiatric/Behavioral: Positive for depression.  All other systems reviewed and are negative.      Objective:   Physical Exam  Constitutional: She is oriented to person, place, and time. She appears well-developed and well-nourished. She is irritable. No distress.  HENT:  Head: Normocephalic and atraumatic.  Right Ear: External ear normal.  Left Ear: External ear normal.  Mouth/Throat: Oropharynx is clear and moist.  Eyes: Pupils are equal, round, and reactive to light.  Neck: Normal range of motion. Neck supple. No thyromegaly present.  Cardiovascular: Normal rate, regular rhythm, normal heart sounds and intact distal pulses.  No murmur heard. Pulmonary/Chest: Effort normal. No respiratory distress. She has decreased breath sounds. She has no wheezes.  Abdominal: Soft. Bowel sounds are normal. She exhibits no distension. There is no tenderness.  Musculoskeletal: Normal range of motion. She exhibits no edema or tenderness.  Neurological: She is alert and oriented to person, place, and time. She has normal reflexes. No cranial nerve deficit.  Skin: Skin is warm and dry.  Psychiatric: She has a normal mood and affect. Judgment and thought content normal. She is withdrawn.  Vitals reviewed.     BP (!) 158/87   Pulse (!) 56   Temp (!) 97.1 F (36.2 C) (  Oral)   Ht '5\' 2"'$  (1.575 m)   Wt 134 lb (60.8 kg)   BMI 24.51 kg/m      Assessment & Plan:  Kathryn Stein comes in today with chief complaint of Medical Management of Chronic Issues   Diagnosis and orders addressed:  1. Essential hypertension, benign - CMP14+EGFR - CBC with  Differential/Platelet  2. Age-related osteoporosis without current pathological fracture - CMP14+EGFR - CBC with Differential/Platelet - DG [facility] DEXA  3. Moderate episode of recurrent major depressive disorder (HCC) We will increase Celexa to 40 mg from 20 mg  Stress management  RTO in 6 weeks  - CMP14+EGFR - CBC with Differential/Platelet - citalopram (CELEXA) 40 MG tablet; Take 1 tablet (40 mg total) by mouth daily.  Dispense: 30 tablet; Refill: 5  4. Hyperlipidemia, unspecified hyperlipidemia type - CMP14+EGFR - CBC with Differential/Platelet  5. Vitamin D deficiency - CMP14+EGFR - CBC with Differential/Platelet   Labs pending Health Maintenance reviewed Diet and exercise encouraged  Follow up plan: 6 weeks    Evelina Dun, FNP

## 2018-04-13 LAB — CBC WITH DIFFERENTIAL/PLATELET
BASOS ABS: 0.1 10*3/uL (ref 0.0–0.2)
Basos: 1 %
EOS (ABSOLUTE): 0 10*3/uL (ref 0.0–0.4)
EOS: 1 %
HEMATOCRIT: 36.3 % (ref 34.0–46.6)
HEMOGLOBIN: 11.9 g/dL (ref 11.1–15.9)
Immature Grans (Abs): 0 10*3/uL (ref 0.0–0.1)
Immature Granulocytes: 0 %
LYMPHS ABS: 1.8 10*3/uL (ref 0.7–3.1)
Lymphs: 44 %
MCH: 30.1 pg (ref 26.6–33.0)
MCHC: 32.8 g/dL (ref 31.5–35.7)
MCV: 92 fL (ref 79–97)
MONOCYTES: 7 %
Monocytes Absolute: 0.3 10*3/uL (ref 0.1–0.9)
Neutrophils Absolute: 1.9 10*3/uL (ref 1.4–7.0)
Neutrophils: 47 %
Platelets: 307 10*3/uL (ref 150–450)
RBC: 3.95 x10E6/uL (ref 3.77–5.28)
RDW: 12.7 % (ref 12.3–15.4)
WBC: 4.1 10*3/uL (ref 3.4–10.8)

## 2018-04-13 LAB — CMP14+EGFR
ALBUMIN: 4.4 g/dL (ref 3.5–4.8)
ALT: 10 IU/L (ref 0–32)
AST: 16 IU/L (ref 0–40)
Albumin/Globulin Ratio: 2.1 (ref 1.2–2.2)
Alkaline Phosphatase: 58 IU/L (ref 39–117)
BILIRUBIN TOTAL: 0.4 mg/dL (ref 0.0–1.2)
BUN / CREAT RATIO: 23 (ref 12–28)
BUN: 19 mg/dL (ref 8–27)
CHLORIDE: 102 mmol/L (ref 96–106)
CO2: 24 mmol/L (ref 20–29)
Calcium: 9.6 mg/dL (ref 8.7–10.3)
Creatinine, Ser: 0.81 mg/dL (ref 0.57–1.00)
GFR, EST AFRICAN AMERICAN: 84 mL/min/{1.73_m2} (ref >59)
GFR, EST NON AFRICAN AMERICAN: 73 mL/min/{1.73_m2} (ref >59)
GLUCOSE: 73 mg/dL (ref 65–99)
Globulin, Total: 2.1 g/dL (ref 1.5–4.5)
Potassium: 4.2 mmol/L (ref 3.5–5.2)
Sodium: 142 mmol/L (ref 134–144)
Total Protein: 6.5 g/dL (ref 6.0–8.5)

## 2018-05-31 ENCOUNTER — Ambulatory Visit: Payer: Medicare Other | Admitting: Family

## 2018-06-07 ENCOUNTER — Encounter: Payer: Self-pay | Admitting: Family

## 2018-06-07 ENCOUNTER — Ambulatory Visit (INDEPENDENT_AMBULATORY_CARE_PROVIDER_SITE_OTHER): Payer: Medicare Other | Admitting: Family

## 2018-06-07 VITALS — BP 145/88 | HR 60 | Temp 97.3°F | Ht 62.0 in | Wt 134.6 lb

## 2018-06-07 DIAGNOSIS — M545 Low back pain, unspecified: Secondary | ICD-10-CM

## 2018-06-07 DIAGNOSIS — I10 Essential (primary) hypertension: Secondary | ICD-10-CM

## 2018-06-07 LAB — URINALYSIS, COMPLETE
BILIRUBIN UA: NEGATIVE
GLUCOSE, UA: NEGATIVE
KETONES UA: NEGATIVE
Leukocytes, UA: NEGATIVE
Nitrite, UA: NEGATIVE
PROTEIN UA: NEGATIVE
SPEC GRAV UA: 1.015 (ref 1.005–1.030)
UUROB: 0.2 mg/dL (ref 0.2–1.0)
pH, UA: 8.5 — ABNORMAL HIGH (ref 5.0–7.5)

## 2018-06-07 LAB — MICROSCOPIC EXAMINATION
BACTERIA UA: NONE SEEN
Renal Epithel, UA: NONE SEEN /hpf

## 2018-06-07 MED ORDER — DICLOFENAC SODIUM 75 MG PO TBEC: 75.0000 mg | DELAYED_RELEASE_TABLET | Freq: Two times a day (BID) | ORAL | 0 refills | Status: DC

## 2018-06-07 MED ORDER — PREDNISONE 10 MG (21) PO TBPK: ORAL_TABLET | ORAL | 0 refills | Status: DC

## 2018-06-07 NOTE — Progress Notes (Signed)
Subjective:    Patient ID: Kathryn Stein, female    DOB: 02/05/1946, 73 y.o.   MRN: [id number]  Chief Complaint  Patient presents with  . Hypertension    six week recheck  . Back Pain    Hypertension  This is a chronic problem. The current episode started more than 1 year ago. The problem has been resolved since onset. The problem is controlled. Associated symptoms include malaise/fatigue. Pertinent negatives include no headaches, peripheral edema or shortness of breath. The current treatment provides moderate improvement.  Back Pain  This is a recurrent problem. The current episode started in the past 7 days. The problem occurs constantly. The problem has been gradually worsening since onset. The pain is present in the lumbar spine and gluteal. The quality of the pain is described as aching. The pain radiates to the left thigh. The pain is at a severity of 6/10. The pain is moderate. The pain is worse during the night. The symptoms are aggravated by bending. Associated symptoms include leg pain, tingling and weakness. Pertinent negatives include no headaches. She has tried NSAIDs for the symptoms. The treatment provided mild relief.      Review of Systems  Constitutional: Positive for malaise/fatigue.  Respiratory: Negative for shortness of breath.   Musculoskeletal: Positive for back pain.  Neurological: Positive for tingling and weakness. Negative for headaches.  All other systems reviewed and are negative.      Objective:   Physical Exam Vitals signs reviewed.  Constitutional:      General: She is not in acute distress.    Appearance: She is well-developed.  HENT:     Head: Normocephalic and atraumatic.     Right Ear: Tympanic membrane normal.     Left Ear: Tympanic membrane normal.  Eyes:     Pupils: Pupils are equal, round, and reactive to light.  Neck:     Musculoskeletal: Normal range of motion and neck supple.     Thyroid: No thyromegaly.  Cardiovascular:   Rate and Rhythm: Normal rate and regular rhythm.     Heart sounds: Normal heart sounds. No murmur.  Pulmonary:     Effort: Pulmonary effort is normal. No respiratory distress.     Breath sounds: Normal breath sounds. No wheezing.  Abdominal:     General: Bowel sounds are normal. There is no distension.     Palpations: Abdomen is soft.     Tenderness: There is no abdominal tenderness.  Musculoskeletal:        General: No tenderness.     Comments: Pain in left lumbar area, negative SLR   Skin:    General: Skin is warm and dry.  Neurological:     Mental Status: She is alert and oriented to person, place, and time.     Cranial Nerves: No cranial nerve deficit.     Deep Tendon Reflexes: Reflexes are normal and symmetric.  Psychiatric:        Behavior: Behavior normal.        Thought Content: Thought content normal.        Judgment: Judgment normal.      BP (!) 145/88   Pulse 60   Temp (!) 97.3 F (36.3 C) (Oral)   Ht 5\' 2"  (1.575 m)   Wt 134 lb 9.6 oz (61.1 kg)   BMI 24.62 kg/m      Assessment & Plan:  [name] comes in today with chief complaint of Hypertension (six week recheck) and Back  Pain   Diagnosis and orders addressed:  1. Acute bilateral low back pain without sciatica Rest Ice  ROM exercises encouraged RTO if symptoms worsen or do not improve - Urinalysis, Complete - diclofenac (VOLTAREN) 75 MG EC tablet; Take 1 tablet (75 mg total) by mouth 2 (two) times daily.  Dispense: 30 tablet; Refill: 0 - predniSONE (STERAPRED UNI-PAK 21 TAB) 10 MG (21) TBPK tablet; Use as directed  Dispense: 21 tablet; Refill: 0  2. Essential hypertension, benign -Dash diet information given -Exercise encouraged - Stress Management  -Continue current meds -RTO in 3 month     Evelina Dun, FNP

## 2018-06-07 NOTE — Patient Instructions (Signed)
Sciatica    Sciatica is pain, numbness, weakness, or tingling along the path of the sciatic nerve. The sciatic nerve starts in the lower back and runs down the back of each leg. The nerve controls the muscles in the lower leg and in the back of the knee. It also provides feeling (sensation) to the back of the thigh, the lower leg, and the sole of the foot. Sciatica is a symptom of another medical condition that pinches or puts pressure on the sciatic nerve.  Generally, sciatica only affects one side of the body. Sciatica usually goes away on its own or with treatment. In some cases, sciatica may keep coming back (recur).  What are the causes?  This condition is caused by pressure on the sciatic nerve, or pinching of the sciatic nerve. This may be the result of:  · A disk in between the bones of the spine (vertebrae) bulging out too far (herniated disk).  · Age-related changes in the spinal disks (degenerative disk disease).  · A pain disorder that affects a muscle in the buttock (piriformis syndrome).  · Extra bone growth (bone spur) near the sciatic nerve.  · An injury or break (fracture) of the pelvis.  · Pregnancy.  · Tumor (rare).  What increases the risk?  The following factors may make you more likely to develop this condition:  · Playing sports that place pressure or stress on the spine, such as football or weight lifting.  · Having poor strength and flexibility.  · A history of back injury.  · A history of back surgery.  · Sitting for long periods of time.  · Doing activities that involve repetitive bending or lifting.  · Obesity.  What are the signs or symptoms?  Symptoms can vary from mild to very severe, and they may include:  · Any of these problems in the lower back, leg, hip, or buttock:  ? Mild tingling or dull aches.  ? Burning sensations.  ? Sharp pains.  · Numbness in the back of the calf or the sole of the foot.  · Leg weakness.  · Severe back pain that makes movement difficult.  These symptoms  may get worse when you cough, sneeze, or laugh, or when you sit or stand for long periods of time. Being overweight may also make symptoms worse. In some cases, symptoms may recur over time.  How is this diagnosed?  This condition may be diagnosed based on:  · Your symptoms.  · A physical exam. Your health care provider may ask you to do certain movements to check whether those movements trigger your symptoms.  · You may have tests, including:  ? Blood tests.  ? X-rays.  ? MRI.  ? CT scan.  How is this treated?  In many cases, this condition improves on its own, without any treatment. However, treatment may include:  · Reducing or modifying physical activity during periods of pain.  · Exercising and stretching to strengthen your abdomen and improve the flexibility of your spine.  · Icing and applying heat to the affected area.  · Medicines that help:  ? To relieve pain and swelling.  ? To relax your muscles.  · Injections of medicines that help to relieve pain, irritation, and inflammation around the sciatic nerve (steroids).  · Surgery.  Follow these instructions at home:  Medicines  · Take over-the-counter and prescription medicines only as told by your health care provider.  · Do not drive or operate heavy   machinery while taking prescription pain medicine.  Managing pain  · If directed, apply ice to the affected area.  ? Put ice in a plastic bag.  ? Place a towel between your skin and the bag.  ? Leave the ice on for 20 minutes, 2-3 times a day.  · After icing, apply heat to the affected area before you exercise or as often as told by your health care provider. Use the heat source that your health care provider recommends, such as a moist heat pack or a heating pad.  ? Place a towel between your skin and the heat source.  ? Leave the heat on for 20-30 minutes.  ? Remove the heat if your skin turns bright red. This is especially important if you are unable to feel pain, heat, or cold. You may have a greater risk  of getting burned.  Activity  · Return to your normal activities as told by your health care provider. Ask your health care provider what activities are safe for you.  ? Avoid activities that make your symptoms worse.  · Take brief periods of rest throughout the day. Resting in a lying or standing position is usually better than sitting to rest.  ? When you rest for longer periods, mix in some mild activity or stretching between periods of rest. This will help to prevent stiffness and pain.  ? Avoid sitting for long periods of time without moving. Get up and move around at least one time each hour.  · Exercise and stretch regularly, as told by your health care provider.  · Do not lift anything that is heavier than 10 lb (4.5 kg) while you have symptoms of sciatica. When you do not have symptoms, you should still avoid heavy lifting, especially repetitive heavy lifting.  · When you lift objects, always use proper lifting technique, which includes:  ? Bending your knees.  ? Keeping the load close to your body.  ? Avoiding twisting.  General instructions  · Use good posture.  ? Avoid leaning forward while sitting.  ? Avoid hunching over while standing.  · Maintain a healthy weight. Excess weight puts extra stress on your back and makes it difficult to maintain good posture.  · Wear supportive, comfortable shoes. Avoid wearing high heels.  · Avoid sleeping on a mattress that is too soft or too hard. A mattress that is firm enough to support your back when you sleep may help to reduce your pain.  · Keep all follow-up visits as told by your health care provider. This is important.  Contact a health care provider if:  · You have pain that wakes you up when you are sleeping.  · You have pain that gets worse when you lie down.  · Your pain is worse than you have experienced in the past.  · Your pain lasts longer than 4 weeks.  · You experience unexplained weight loss.  Get help right away if:  · You lose control of your  bowel or bladder (incontinence).  · You have:  ? Weakness in your lower back, pelvis, buttocks, or legs that gets worse.  ? Redness or swelling of your back.  ? A burning sensation when you urinate.  This information is not intended to replace advice given to you by your health care provider. Make sure you discuss any questions you have with your health care provider.  Document Released: 04/22/2001 Document Revised: 10/02/2015 Document Reviewed: 01/05/2015  Elsevier Interactive Patient Education ©   2019 [employer]

## 2018-07-26 ENCOUNTER — Telehealth: Payer: Self-pay | Admitting: Family

## 2018-07-26 ENCOUNTER — Other Ambulatory Visit: Payer: Self-pay

## 2018-07-26 DIAGNOSIS — M545 Low back pain, unspecified: Secondary | ICD-10-CM

## 2018-07-26 NOTE — Telephone Encounter (Signed)
Last seen 06/07/2018

## 2018-07-26 NOTE — Telephone Encounter (Signed)
Sent to [facility]

## 2018-07-26 NOTE — Telephone Encounter (Signed)
What is the name of the medication? Prednisone 10 mg  Have you contacted your pharmacy to request a refill? NO  Which pharmacy would you like this sent to? [facility] in [place]   Patient notified that their request is being sent to the clinical staff for review and that they should receive a call once it is complete. If they do not receive a call within 24 hours they can check with their pharmacy or our office.

## 2018-07-27 MED ORDER — PREDNISONE 10 MG (21) PO TBPK: ORAL_TABLET | ORAL | 0 refills | Status: DC

## 2018-08-10 ENCOUNTER — Other Ambulatory Visit: Payer: Self-pay | Admitting: Family

## 2018-08-10 DIAGNOSIS — M545 Low back pain, unspecified: Secondary | ICD-10-CM

## 2018-08-23 ENCOUNTER — Encounter: Payer: Medicare Other | Admitting: *Deleted

## 2018-08-27 ENCOUNTER — Other Ambulatory Visit: Payer: Self-pay | Admitting: Family

## 2018-08-27 DIAGNOSIS — I10 Essential (primary) hypertension: Secondary | ICD-10-CM

## 2018-09-16 ENCOUNTER — Ambulatory Visit (INDEPENDENT_AMBULATORY_CARE_PROVIDER_SITE_OTHER): Payer: Medicare Other | Admitting: *Deleted

## 2018-09-16 ENCOUNTER — Other Ambulatory Visit: Payer: Self-pay

## 2018-09-16 ENCOUNTER — Other Ambulatory Visit: Payer: Self-pay | Admitting: Family

## 2018-09-16 ENCOUNTER — Encounter: Payer: Self-pay | Admitting: *Deleted

## 2018-09-16 DIAGNOSIS — F331 Major depressive disorder, recurrent, moderate: Secondary | ICD-10-CM

## 2018-09-16 DIAGNOSIS — I10 Essential (primary) hypertension: Secondary | ICD-10-CM

## 2018-09-16 DIAGNOSIS — Z0000 Encounter for general adult medical examination without abnormal findings: Secondary | ICD-10-CM

## 2018-09-16 NOTE — Progress Notes (Addendum)
[place] VISIT  09/16/2018  Telephone Visit Disclaimer This Medicare AWV was conducted by telephone due to national recommendations for restrictions regarding the COVID-19 Pandemic (e.g. social distancing).  I verified, using two identifiers, that I am speaking with Kathryn Stein or their authorized healthcare agent. I discussed the limitations, risks, security, and privacy concerns of performing an evaluation and management service by telephone and the potential availability of an in-person appointment in the future. The patient expressed understanding and agreed to proceed.   Subjective:  Kathryn Stein is a 73 y.o. female patient of Hawks, Kathryn Hawthorne, FNP who had a Medicare Annual Wellness Visit today via telephone. Kathryn Stein is retired/disabled and lives with her husband, adult daughter and grandchild. she has 5 children, 18 grandchildren, and 8 great grandchildren. she reports that she is socially active and does interact with friends/family regularly. She enjoys being active in her church, and used to sing in the choir and usher, but stopped due to back pain.  she is moderately physically active and enjoys walking daily for exercise and working around her yard and house.  She states she has not had an eye exam in many years because she could not afford it.  She also mentioned that she feels a significant amount of stress at times.  Offered patient a referral to CCM for possible counseling and more information regarding community resources  to determine any services she may qualify for- she was agreeable to this.   Patient Care Team: Kathryn Balloon, FNP as PCP - General (Nurse Practitioner) Milus Banister, MD as Attending Physician (Gastroenterology) Rogene Houston, MD as Consulting Physician (Gastroenterology)  Advanced Directives 09/16/2018 08/18/2017 06/05/2017 09/21/2014 07/21/2014  Does Patient Have a Medical Advance Directive? No No No No No  Would patient like  information on creating a medical advance directive? Yes (MAU/Ambulatory/Procedural Areas - Information given) No - Patient declined No - Patient declined Yes - Scientist, clinical (histocompatibility and immunogenetics) given Yes - Educational materials given    Hospital Utilization Over the Past 12 Months: # of hospitalizations or ER visits: 0 # of surgeries: 0  Review of Systems    Patient reports that her overall health is unchanged compared to last year.    Review of Systems: Musculoskeletal - positive for lower back apin  All other systems negative as reported by patient.  Pain Assessment Pain Score: 8      Current Medications & Allergies (verified) Allergies as of 09/16/2018      Reactions   Codeine Nausea And Vomiting   Darvocet [propoxyphene N-acetaminophen]    Nausea and vomiting      Medication List       Accurate as of Sep 16, 2018  4:05 PM. If you have any questions, ask your nurse or doctor.        aspirin EC 81 MG tablet Take 81 mg by mouth daily as needed. For pain   atorvastatin 20 MG tablet Commonly known as:  Lipitor Take 1 tablet (20 mg total) by mouth daily.   citalopram 40 MG tablet Commonly known as:  CeleXA Take 1 tablet (40 mg total) by mouth daily.   cyproheptadine 4 MG tablet Commonly known as:  PERIACTIN Take 1 tablet (4 mg total) by mouth 3 (three) times daily as needed.   diclofenac 75 MG EC tablet Commonly known as:  VOLTAREN Take 1 tablet by mouth twice daily   FISH OIL PO Take by mouth. Take one pill daily  fluticasone 50 MCG/ACT nasal spray Commonly known as:  FLONASE Place 2 sprays into both nostrils daily.   lisinopril 10 MG tablet Commonly known as:  ZESTRIL Take 1 tablet by mouth once daily   predniSONE 10 MG (21) Tbpk tablet Commonly known as:  STERAPRED UNI-PAK 21 TAB Use as directed   raloxifene 60 MG tablet Commonly known as:  EVISTA TAKE 1 TABLET BY MOUTH ONCE A DAY FOR BONES   Vitamin D (Ergocalciferol) 1.25 MG (50000 UT) Caps capsule  Commonly known as:  DRISDOL Take 1 capsule (50,000 Units total) by mouth every 7 (seven) days.       History (reviewed): Past Medical History:  Diagnosis Date  . Abnormal heart rate    after birth of 3rd child  . Anemia   . Anorexia   . Anxiety   . Arthritis   . Colon polyps   . Depression   . Dry cough    on and off  . Dyspepsia   . Gastritis    found on EGD  . GERD (gastroesophageal reflux disease)   . Hyperlipidemia   . Hypertension   . Marital relationship problem   . Osteoporosis   . Postmenopausal   . Vertigo   . Vertigo due to previous cerebellar infarction    Past Surgical History:  Procedure Laterality Date  . BREAST SURGERY Left    biopsy benign  . CESAREAN SECTION     per pt/ no c-section/had 5 vaginal births  . COLONOSCOPY    . MOUTH SURGERY     bone extraction  . TUBAL LIGATION     Family History  Problem Relation Age of Onset  . Colon cancer Mother   . Diabetes Mother   . Cancer Mother   . Heart disease Mother   . Hypertension Mother   . Stroke Mother   . Cancer Brother        throat - smoker  . Kidney disease Brother   . Prostate cancer Brother   . Heart disease Sister   . Cancer Sister        breast  . Diabetes Sister   . Liver disease Brother   . Diabetes Brother   . Hypertension Brother   . Drug abuse Brother   . Stroke Father 19  . Diabetes Sister   . Heart disease Sister   . Hypertension Sister   . Diabetes Sister   . Cancer Sister   . Cancer Brother        prostate  . Stomach cancer Other        niece  . Kidney cancer Other        niece  . Cancer Daughter        kidney  . Alcohol abuse Brother   . Early death Sister   . Heart disease Sister   . Hypertension Sister   . Hypertension Sister   . Diabetes Sister   . Mental illness Daughter    Social History   Socioeconomic History  . Marital status: Married    Spouse name: Not on file  . Number of children: 5  . Years of education: 6  . Highest education  level: 6th grade  Occupational History  . Occupation: disability/ retired    Fish farm manager: [employer]  Social Needs  . Financial resource strain: Not very hard  . Food insecurity:    Worry: Never true    Inability: Never true  . Transportation needs:    Medical: No  Non-medical: No  Tobacco Use  . Smoking status: Never Smoker  . Smokeless tobacco: Never Used  Substance and Sexual Activity  . Alcohol use: No  . Drug use: No  . Sexual activity: Yes  Lifestyle  . Physical activity:    Days per week: 5 days    Minutes per session: 30 min  . Stress: To some extent  Relationships  . Social connections:    Talks on phone: More than three times a week    Gets together: More than three times a week    Attends religious service: More than 4 times per year    Active member of club or organization: No    Attends meetings of clubs or organizations: Never    Relationship status: Married  Other Topics Concern  . Not on file  Social History Narrative  . Not on file    Activities of Daily Living In your present state of health, do you have any difficulty performing the following activities: 09/16/2018  Hearing? Y  Comment Had hearing checked about 6 months ago, needs hearing aids  Vision? Y  Comment Due for eye exam  Difficulty concentrating or making decisions? Y  Comment Trouble remembering at times  Walking or climbing stairs? N  Dressing or bathing? N  Doing errands, shopping? Y  Comment Family or friend provides Copywriter, advertising and eating ? N  Using the Toilet? N  In the past six months, have you accidently leaked urine? N  Do you have problems with loss of bowel control? N  Managing your Medications? N  Managing your Finances? N  Housekeeping or managing your Housekeeping? N  Some recent data might be hidden        Exercise Current Exercise Habits: Home exercise routine, Type of exercise: walking, Time (Minutes): 30, Frequency (Times/Week): 5, Weekly  Exercise (Minutes/Week): 150, Intensity: Moderate, Exercise limited by: orthopedic condition(s)  Diet Patient reports consuming 2 meals a day and 2 snack(s) a day Patient reports that her primary diet is: Regular Patient reports that she does have regular access to food.  States she does not have a significant appetite most of the time  Depression Screen PHQ 2/9 Scores 09/16/2018 06/07/2018 04/12/2018 01/07/2018 11/26/2017 08/25/2017 08/18/2017  PHQ - 2 Score 1 1 4  0 4 3 4   PHQ- 9 Score - - 10 - 16 6 11      Fall Risk Fall Risk  09/16/2018 06/07/2018 04/12/2018 01/07/2018 08/18/2017  Falls in the past year? 0 0 0 No No  Number falls in past yr: - - - - -  Injury with Fall? - - - - -  Risk for fall due to : Impaired balance/gait;Impaired mobility - - - -  Follow up - - - - -  Comment - - - - -     Objective:  Kathryn Stein seemed alert and oriented and she participated appropriately during our telephone visit.  Blood Pressure Weight BMI  BP Readings from Last 3 Encounters:  06/07/18 (!) 145/88  04/12/18 (!) 158/87  02/26/18 132/77   Wt Readings from Last 3 Encounters:  06/07/18 134 lb 9.6 oz (61.1 kg)  04/12/18 134 lb (60.8 kg)  02/26/18 134 lb (60.8 kg)   BMI Readings from Last 1 Encounters:  06/07/18 24.62 kg/m    *Unable to obtain current vital signs, weight, and BMI due to telephone visit type  Hearing/Vision  . Char did not seem to have difficulty with hearing/understanding during the telephone  conversation . Reports that she has not had a formal eye exam by an eye care professional within the past year . Reports that she has had a formal hearing evaluation within the past year.  States she need hearing aids, but cannot afford them *Unable to fully assess hearing and vision during telephone visit type  Cognitive Function: 6CIT Screen 09/16/2018  What Year? 0 points  What month? 0 points  What time? 0 points  Count back from 20 0 points  Months in reverse 0 points  Repeat  phrase 0 points  Total Score 0    Normal Cognitive Function Screening: Yes (Normal:0-7, Significant for Dysfunction: >8)  Immunization & Health Maintenance Record Immunization History  Administered Date(s) Administered  . DTaP 07/12/2010  . Influenza,inj,Quad PF,6+ Mos 04/05/2013, 02/15/2015  . Influenza-Unspecified 02/23/2012  . Pneumococcal Conjugate-13 07/21/2014  . Pneumococcal Polysaccharide-23 12/23/2011  . Td 08/09/2010  . Tdap 08/09/2010  . Zoster 11/22/2013    Health Maintenance  Topic Date Due  . MAMMOGRAM  04/01/2018  . COLON CANCER SCREENING ANNUAL FOBT  08/19/2018  . INFLUENZA VACCINE  12/11/2018  . DEXA SCAN  04/12/2020  . TETANUS/TDAP  08/08/2020  . COLONOSCOPY  02/27/2023  . Hepatitis C Screening  Completed  . PNA vac Low Risk Adult  Completed       Assessment  This is a routine wellness examination for Kathryn Stein.  Health Maintenance: Due or Overdue Health Maintenance Due  Topic Date Due  . MAMMOGRAM  04/01/2018  . COLON CANCER SCREENING ANNUAL FOBT  08/19/2018   Mammogram scheduled 11/23/2018   Kathryn Stein does not need a referral for Community Assistance: Care Management:   yes Social Work:    yes Prescription Assistance:  yes Nutrition/Diabetes Education:  no   Plan:  Personalized Goals Goals Addressed            This Visit's Progress   . Exercise 150 minutes per week (moderate activity)   On track    Walk for at least 30 minutes 3 times per week    . Have 3 meals a day   No change    Try to eat a balanced meal with lean proteins, fruits, and vegetables 3 times a day with a small healthy snack once or twice a day      Personalized Health Maintenance & Screening Recommendations  Screening mammography Advanced directives: has NO advanced directive  - add't info requested. Referral to SW: yes  Lung Cancer Screening Recommended: no (Low Dose CT Chest recommended if Age 29-80 years, 30 pack-year currently smoking OR have  quit w/in past 15 years) Hepatitis C Screening recommended: completed 05/01/2015   Advanced Directives: Written information was prepared per patient's request.  Referrals & Orders Orders Placed This Encounter  Procedures  . Ambulatory referral to Chronic Care Management Services    Follow-up Plan . Follow-up with Kathryn Balloon, FNP as planned . Schedule eye exam . Continue your walking routine for exercise  . Discuss community resources and stress management with CCM team   I have personally reviewed and noted the following in the patient's chart:   . Medical and social history . Use of alcohol, tobacco or illicit drugs  . Current medications and supplements . Functional ability and status . Nutritional status . Physical activity . Advanced directives . List of other physicians . Hospitalizations, surgeries, and ER visits in previous 12 months . Vitals . Screenings to include cognitive, depression, and falls . Referrals  and appointments  In addition, I have reviewed and discussed with Kathryn Stein certain preventive protocols, quality metrics, and best practice recommendations. A written personalized care plan for preventive services as well as general preventive health recommendations is available and can be mailed to the patient at her request.      Nolberto Hanlon, RN  09/16/2018  I have reviewed and agree with the above AWV documentation.   Evelina Dun, FNP

## 2018-09-16 NOTE — Patient Instructions (Signed)
  Kathryn Stein , Thank you for taking time to talk with me for your Medicare Wellness Visit. I appreciate your ongoing commitment to your health goals. Please review the following plan we discussed and let me know if I can assist you in the future.   These are the goals we discussed: Goals    . Exercise 150 minutes per week (moderate activity)     Walk for at least 30 minutes 5 times per week    . Have 3 meals a day     Try to eat a balanced meal with lean proteins, fruits, and vegetables 3 times a day with a small healthy snack once or twice a day       This is a list of the screening recommended for you and due dates:  Health Maintenance  Topic Date Due  . Mammogram  04/01/2018  . Stool Blood Test  08/19/2018  . Flu Shot  12/11/2018  . DEXA scan (bone density measurement)  04/12/2020  . Tetanus Vaccine  08/08/2020  . Colon Cancer Screening  02/27/2023  .  Hepatitis C: One time screening is recommended by Center for Disease Control  (CDC) for  adults born from [birth year] through 1965.   Completed  . Pneumonia vaccines  Completed

## 2018-09-17 ENCOUNTER — Encounter: Payer: Self-pay | Admitting: Family

## 2018-09-17 ENCOUNTER — Other Ambulatory Visit: Payer: Self-pay

## 2018-09-17 ENCOUNTER — Ambulatory Visit: Payer: Self-pay | Admitting: Licensed Clinical Social Worker

## 2018-09-17 ENCOUNTER — Ambulatory Visit (INDEPENDENT_AMBULATORY_CARE_PROVIDER_SITE_OTHER): Payer: Medicare Other | Admitting: Family

## 2018-09-17 DIAGNOSIS — M150 Primary generalized (osteo)arthritis: Secondary | ICD-10-CM | POA: Diagnosis not present

## 2018-09-17 DIAGNOSIS — M545 Low back pain, unspecified: Secondary | ICD-10-CM

## 2018-09-17 DIAGNOSIS — M810 Age-related osteoporosis without current pathological fracture: Secondary | ICD-10-CM

## 2018-09-17 DIAGNOSIS — E785 Hyperlipidemia, unspecified: Secondary | ICD-10-CM

## 2018-09-17 DIAGNOSIS — E559 Vitamin D deficiency, unspecified: Secondary | ICD-10-CM

## 2018-09-17 DIAGNOSIS — M159 Polyosteoarthritis, unspecified: Secondary | ICD-10-CM

## 2018-09-17 DIAGNOSIS — I10 Essential (primary) hypertension: Secondary | ICD-10-CM

## 2018-09-17 DIAGNOSIS — F331 Major depressive disorder, recurrent, moderate: Secondary | ICD-10-CM

## 2018-09-17 DIAGNOSIS — R5383 Other fatigue: Secondary | ICD-10-CM

## 2018-09-17 MED ORDER — MELOXICAM 15 MG PO TABS: 15.0000 mg | ORAL_TABLET | Freq: Every day | ORAL | 1 refills | Status: DC

## 2018-09-17 NOTE — Chronic Care Management (AMB) (Addendum)
  Care Management Note   Kathryn Stein is a 73 y.o. year old female who is a primary care patient of Sharion Balloon, FNP. The CM team was consulted for assistance with chronic disease management and care coordination.   I reached out to Denyce Robert by phone today. LCSW was unable to speak via phone with client on 09/17/2018 but LCSW did leave a phone message for client requesting client return call to LCSW at [phone] to discuss CCM program services.  Review of patient status, including review of consultants reports, relevant laboratory and other test results, and collaboration with appropriate care team members and the patient's provider was performed as part of comprehensive patient evaluation and provision of chronic care management services.   Follow Up Plan: LCSW to call client in next 2 weeks to talk further with client about CCM program services  Norva Riffle.Mychael Soots MSW, LCSW Licensed Clinical Social Worker [facility] Care Management [phone]  I have reviewed and agree with the above  documentation.   Evelina Dun, FNP

## 2018-09-17 NOTE — Patient Instructions (Addendum)
Licensed Clinical Water engineer provided:   LCSW reached out to Denyce Robert by phone today. LCSW was unable to speak via phone with client on 09/17/2018 but LCSW did leave a phone message for client requesting client return call to LCSW at [phone] to discuss CCM program services.  Follow Up Plan: LCSW to call client in next 2 weeks to talk with client about CCM program services  LCSW was not able to speak via phone with client on 09/17/2018; thus the patient was not able to verbalize understanding of instructions provided today and was not able to accept or decline a print copy of patient instruction materials.   Norva Riffle.Marvis Saefong MSW, LCSW Licensed Clinical Social Worker [facility] Care Management [phone]

## 2018-09-17 NOTE — Progress Notes (Signed)
   Virtual Visit via telephone Note  I connected with Kathryn Stein on 09/17/18 at 9:08 AM by telephone and verified that I am speaking with the correct person using two identifiers. Kathryn Stein is currently located at home and husband  is currently with her during visit. The provider, Evelina Dun, FNP is located in their office at time of visit.  I discussed the limitations, risks, security and privacy concerns of performing an evaluation and management service by telephone and the availability of in person appointments. I also discussed with the patient that there may be a patient responsible charge related to this service. The patient expressed understanding and agreed to proceed.   History and Present Illness:  Back Pain  This is a recurrent problem. The current episode started 1 to 4 weeks ago. The problem occurs intermittently. The problem has been waxing and waning since onset. The pain is present in the lumbar spine. The quality of the pain is described as aching. The pain does not radiate. The pain is at a severity of 6/10. The pain is moderate. The symptoms are aggravated by bending and twisting. Pertinent negatives include no bladder incontinence, bowel incontinence or leg pain. Risk factors include history of osteoporosis, menopause, lack of exercise and sedentary lifestyle. She has tried bed rest for the symptoms. The treatment provided mild relief.      Review of Systems  Gastrointestinal: Negative for bowel incontinence.  Genitourinary: Negative for bladder incontinence.  Musculoskeletal: Positive for back pain.  All other systems reviewed and are negative.    Observations/Objective: No SOB or distress   Assessment and Plan: Kathryn Stein comes in today with chief complaint of No chief complaint on file.   Diagnosis and orders addressed:  1. Low back pain, unspecified back pain laterality, unspecified chronicity, unspecified whether sciatica present - meloxicam  (MOBIC) 15 MG tablet; Take 1 tablet (15 mg total) by mouth daily.  Dispense: 90 tablet; Refill: 1  2. Primary osteoarthritis involving multiple joints - meloxicam (MOBIC) 15 MG tablet; Take 1 tablet (15 mg total) by mouth daily.  Dispense: 90 tablet; Refill: 1  Will start Mobic daily today No other NSAID"S  Risk of bleeding discussed, will take medication with food Call office if pain does not improve or worsens     I discussed the assessment and treatment plan with the patient. The patient was provided an opportunity to ask questions and all were answered. The patient agreed with the plan and demonstrated an understanding of the instructions.   The patient was advised to call back or seek an in-person evaluation if the symptoms worsen or if the condition fails to improve as anticipated.  The above assessment and management plan was discussed with the patient. The patient verbalized understanding of and has agreed to the management plan. Patient is aware to call the clinic if symptoms persist or worsen. Patient is aware when to return to the clinic for a follow-up visit. Patient educated on when it is appropriate to go to the emergency department.   Time call ended:  9:18 Am  I provided 10 minutes of non-face-to-face time during this encounter.    Evelina Dun, FNP

## 2018-09-29 ENCOUNTER — Other Ambulatory Visit: Payer: Self-pay | Admitting: Family

## 2018-09-29 ENCOUNTER — Other Ambulatory Visit: Payer: Self-pay

## 2018-09-29 NOTE — Patient Outreach (Signed)
[facility]) Care Management  09/29/2018  Kathryn Stein 26-Aug-1945 [id number]   Medication Adherence call to [name] [employer] spoke with patient she is due on Lisinopril 10 mg she is still taking 1 tablet daily and said she is going to need more she ask if we can call [facility] an order all of her medication and will pick up when ready. [facility] will have all of her medications ready for patient . [name] is showing past due under [place].   [place] Management Direct Dial [phone]  Fax [phone] [email or web address]

## 2018-10-01 ENCOUNTER — Ambulatory Visit: Payer: Self-pay | Admitting: Licensed Clinical Social Worker

## 2018-10-01 DIAGNOSIS — I10 Essential (primary) hypertension: Secondary | ICD-10-CM

## 2018-10-01 DIAGNOSIS — E785 Hyperlipidemia, unspecified: Secondary | ICD-10-CM

## 2018-10-01 DIAGNOSIS — E559 Vitamin D deficiency, unspecified: Secondary | ICD-10-CM

## 2018-10-01 DIAGNOSIS — R5383 Other fatigue: Secondary | ICD-10-CM

## 2018-10-01 DIAGNOSIS — F331 Major depressive disorder, recurrent, moderate: Secondary | ICD-10-CM

## 2018-10-01 DIAGNOSIS — M810 Age-related osteoporosis without current pathological fracture: Secondary | ICD-10-CM

## 2018-10-01 NOTE — Chronic Care Management (AMB) (Addendum)
  Care Management Note   Kathryn Stein is a 73 y.o. year old female who is a primary care patient of Sharion Balloon, FNP. The CM team was consulted for assistance with chronic disease management and care coordination.   I reached out to Kathryn Stein by phone today.   Kathryn Stein was given information about Chronic Care Management services today including:  1. CCM service includes personalized support from designated clinical staff supervised by her physician, including individualized plan of care and coordination with other care providers 2. 24/7 contact phone numbers for assistance for urgent and routine care needs. 3. Service will only be billed when office clinical staff spend 20 minutes or more in a month to coordinate care. 4. Only one practitioner may furnish and bill the service in a calendar month. 5. The patient may stop CCM services at any time (effective at the end of the month) by phone call to the office staff. 6. The patient will be responsible for cost sharing (co-pay) of up to 20% of the service fee (after annual deductible is met). Patient did not agree to services and wishes to consider information provided before deciding about enrollment in CCM services.    Review of patient status, including review of consultants reports, relevant laboratory and other test results, and collaboration with appropriate care team members and the patient's provider was performed as part of comprehensive patient evaluation and provision of chronic care management services.   Follow Up Plan: LCSW to call client in next 2 weeks to talk further with client about CCM program services.  Kathryn Stein.Kathryn Stein MSW, LCSW Licensed Clinical Social Worker [facility] Care Management [phone]  I have reviewed and agree with the above documentation.   Evelina Dun, FNP

## 2018-10-01 NOTE — Patient Instructions (Signed)
Licensed Clinical Social Worker Visit Information  Materials Provided: No  Ms. Abboud was given information about Chronic Care Management services today including:  1. CCM service includes personalized support from designated clinical staff supervised by her physician, including individualized plan of care and coordination with other care providers 2. 24/7 contact phone numbers for assistance for urgent and routine care needs. 3. Service will only be billed when office clinical staff spend 20 minutes or more in a month to coordinate care. 4. Only one practitioner may furnish and bill the service in a calendar month. 5. The patient may stop CCM services at any time (effective at the end of the month) by phone call to the office staff. 6. The patient will be responsible for cost sharing (co-pay) of up to 20% of the service fee (after annual deductible is met).  Patient did not agree to services and wishes to consider information provided before deciding about enrollment in CCM services.    Follow Up Plan: LCSW to call client in next 2 weeks to talk further with client about CCM program services.  The patient verbalized understanding of instructions provided today and declined a print copy of patient instruction materials.   Norva Riffle.Desta Bujak MSW, LCSW Licensed Clinical Social Worker [facility] Care Management [phone]

## 2018-10-12 ENCOUNTER — Other Ambulatory Visit: Payer: Self-pay | Admitting: Family

## 2018-10-15 ENCOUNTER — Ambulatory Visit: Payer: Medicare Other | Admitting: Licensed Clinical Social Worker

## 2018-10-15 ENCOUNTER — Other Ambulatory Visit: Payer: Self-pay

## 2018-10-15 DIAGNOSIS — R5383 Other fatigue: Secondary | ICD-10-CM

## 2018-10-15 DIAGNOSIS — M810 Age-related osteoporosis without current pathological fracture: Secondary | ICD-10-CM

## 2018-10-15 DIAGNOSIS — F331 Major depressive disorder, recurrent, moderate: Secondary | ICD-10-CM

## 2018-10-15 DIAGNOSIS — E559 Vitamin D deficiency, unspecified: Secondary | ICD-10-CM

## 2018-10-15 DIAGNOSIS — I10 Essential (primary) hypertension: Secondary | ICD-10-CM

## 2018-10-15 DIAGNOSIS — E785 Hyperlipidemia, unspecified: Secondary | ICD-10-CM

## 2018-10-15 NOTE — Chronic Care Management (AMB) (Addendum)
  Care Management Note   Kathryn Stein is a 73 y.o. year old female who is a primary care patient of Sharion Balloon, FNP. The CM team was consulted for assistance with chronic disease management and care coordination.   I reached out to Kathryn Stein by phone several times today. However, I was not able to speak via phone with Kathryn Stein or with Kathryn Stein. Also, both numbers called did not have answering machine on line and thus LCSW was not able to leave phone message with information pertaining to program on either phone number called. LCSW will call client or her spouse in next 2 weeks to try to speak more with client or spouse about CCM program services.     Follow Up Plan: LCSW will call client or spouse of client in next 2 weeks to speak more with client or spouse about CCM program services.  Kathryn Stein.Kathryn Stein MSW, LCSW Licensed Clinical Social Worker [facility] Care Management [phone]  I have reviewed and agree with the above AWV documentation.   Evelina Dun, FNP

## 2018-10-15 NOTE — Patient Instructions (Addendum)
Licensed Clinical Water engineer Provided: No   I reached out to [employer] Kathryn Stein by phone several times today. However, I was not able to speak via phone with Kathryn Stein or with Kathryn Stein. Also, both numbers called did not have answering machine on line and thus LCSW was not able to leave phone message with information pertaining to program on either phone number called. LCSW will call client or her spouse in next 2 weeks to try to speak more with client or spouse about CCM program services.  Follow Up Plan: LCSW to call client or spouse of client in next 2 weeks to talk with client or spouse about CCM program services  LCSW was not able to speak with client or spouse via phone on 10/15/2018. Thus , patient or spouse were not able to verbalize understanding of instructions provided today and were not able to accept or decline a print copy of patient instruction materials.   Kathryn Stein.Kathryn Stein MSW, LCSW Licensed Clinical Social Worker [facility] Care Management [phone]

## 2018-10-29 ENCOUNTER — Ambulatory Visit: Payer: Self-pay | Admitting: Licensed Clinical Social Worker

## 2018-10-29 DIAGNOSIS — F331 Major depressive disorder, recurrent, moderate: Secondary | ICD-10-CM

## 2018-10-29 DIAGNOSIS — M810 Age-related osteoporosis without current pathological fracture: Secondary | ICD-10-CM

## 2018-10-29 DIAGNOSIS — E785 Hyperlipidemia, unspecified: Secondary | ICD-10-CM

## 2018-10-29 DIAGNOSIS — E559 Vitamin D deficiency, unspecified: Secondary | ICD-10-CM

## 2018-10-29 DIAGNOSIS — I10 Essential (primary) hypertension: Secondary | ICD-10-CM

## 2018-10-29 DIAGNOSIS — R5383 Other fatigue: Secondary | ICD-10-CM

## 2018-10-29 NOTE — Chronic Care Management (AMB) (Addendum)
  Chronic Care Management    Clinical Social Work CCM Outreach Note  10/29/2018 Name: Kathryn Stein MRN: [id number] DOB: 1946/03/17  Kathryn Stein is a 73 y.o. year old female who is a primary care patient of Sharion Balloon, FNP . The CCM team was consulted for assistance with assessment of psychosocial needs.   LCSW reached out to Kathryn Kathryn Stein by phone several times today. However, I was not able to speak via phone with Kathryn Stein or with Kathryn Stein. LCSW did leave a phone message for Kathryn Stein requesting she return call to LCSW at [phone] to discuss CCM program services.   Follow Up Plan: LCSW to call client or spouse of client in next 3 weeks to talk with client or spouse about CCM program services  Norva Riffle.Gabrelle Roca MSW, LCSW Licensed Clinical Social Worker [facility] Care Management [phone]  I have reviewed and agree with the above  documentation.   Evelina Dun, FNP

## 2018-10-29 NOTE — Patient Instructions (Addendum)
Licensed Clinical Corporate investment banker Provided: No   LCSW reached out to [employer].However, I was not able to speak via phone with Paticia Stack or with Gardenia Phlegm. LCSW did leave a phone message for RAQUEL RACEY requesting she return call to LCSW at [phone] to discuss CCM program services.   Follow Up Plan: LCSW to call client or her spouse in next 3 weeks to discuss with client or spouse CCM program service.  LCSW was not able to speak via phone with client or spouse on 10/29/2018, Thus, client or spouse were not able to verbalize understanding of instructions provided today and were not able to accept or decline a print copy of patient instruction materials.   Norva Riffle.Jahmad Petrich MSW, LCSW Licensed Clinical Social Worker [facility] Care Management [phone]

## 2018-11-19 ENCOUNTER — Ambulatory Visit: Payer: Self-pay | Admitting: Licensed Clinical Social Worker

## 2018-11-19 DIAGNOSIS — E559 Vitamin D deficiency, unspecified: Secondary | ICD-10-CM

## 2018-11-19 DIAGNOSIS — E785 Hyperlipidemia, unspecified: Secondary | ICD-10-CM

## 2018-11-19 DIAGNOSIS — F331 Major depressive disorder, recurrent, moderate: Secondary | ICD-10-CM

## 2018-11-19 DIAGNOSIS — M810 Age-related osteoporosis without current pathological fracture: Secondary | ICD-10-CM

## 2018-11-19 DIAGNOSIS — I10 Essential (primary) hypertension: Secondary | ICD-10-CM

## 2018-11-19 DIAGNOSIS — R5383 Other fatigue: Secondary | ICD-10-CM

## 2018-11-19 NOTE — Chronic Care Management (AMB) (Addendum)
  Care Management Note   Kathryn Stein is a 73 y.o. year old female who is a primary care patient of Sharion Balloon, FNP. The CM team was consulted for assistance with chronic disease management and care coordination.   I reached out to Kathryn Stein by phone today. I was not able to speak via phone with client today. However, LCSW did leave voice mail message for Kathryn Stein today informing her of CCM program and requesting that she return call to LCSW at [phone] to further discuss CCM program   Ms. Prange was given information about Chronic Care Management services today including:  1. CCM service includes personalized support from designated clinical staff supervised by her physician, including individualized plan of care and coordination with other care providers 2. 24/7 contact phone numbers for assistance for urgent and routine care needs. 3. Service will only be billed when office clinical staff spend 20 minutes or more in a month to coordinate care. 4. Only one practitioner may furnish and bill the service in a calendar month. 5. The patient may stop CCM services at any time (effective at the end of the month) by phone call to the office staff. 6. The patient will be responsible for cost sharing (co-pay) of up to 20% of the service fee (after annual deductible is met). Patient did not agree to services and wishes to consider information provided before deciding about enrollment in care management services.  .   Follow Up Plan: LCSW to call client in next 3 weeks to talk further with her about CCM program services  Norva Riffle.Craige Patel MSW, LCSW Licensed Clinical Social Worker [facility] [phone]  I have reviewed and agree with the above documentation.   Evelina Dun, FNP

## 2018-11-19 NOTE — Patient Instructions (Addendum)
Licensed Clinical Water engineer Provided: No  I reached out to Kathryn Stein by phone today. I was not able to speak via phone with client today. However, LCSW did leave voice mail message for Kathryn Stein today informing her of CCM program and requesting that she return call to LCSW at [phone] to further discuss CCM program   [name] was given information about Chronic Care Management services today including:  1. CCM service includes personalized support from designated clinical staff supervised by her physician, including individualized plan of care and coordination with other care providers 2. 24/7 contact phone numbers for assistance for urgent and routine care needs. 3. Service will only be billed when office clinical staff spend 20 minutes or more in a month to coordinate care. 4. Only one practitioner may furnish and bill the service in a calendar month. 5. The patient may stop CCM services at any time (effective at the end of the month) by phone call to the office staff. 6. The patient will be responsible for cost sharing (co-pay) of up to 20% of the service fee (after annual deductible is met). Patient did not agree to services and wishes to consider information provided before deciding about enrollment in care management services.  .     Follow Up Plan: LCSW to call client in next 3 weeks to talk with client about CCM program  Services  LCSW was not able to speak via phone with client on 11/19/2018. Thus patient was not able to verbalize understanding of instructions provided today and was not able to accept or decline a print copy of patient instruction materials.   Norva Riffle.Merit Maybee MSW, LCSW Licensed Clinical Social Worker [facility] Care Management [phone]

## 2018-12-10 ENCOUNTER — Ambulatory Visit: Payer: Self-pay | Admitting: Licensed Clinical Social Worker

## 2018-12-10 DIAGNOSIS — F331 Major depressive disorder, recurrent, moderate: Secondary | ICD-10-CM

## 2018-12-10 DIAGNOSIS — R5383 Other fatigue: Secondary | ICD-10-CM

## 2018-12-10 DIAGNOSIS — E559 Vitamin D deficiency, unspecified: Secondary | ICD-10-CM

## 2018-12-10 DIAGNOSIS — E785 Hyperlipidemia, unspecified: Secondary | ICD-10-CM

## 2018-12-10 DIAGNOSIS — M810 Age-related osteoporosis without current pathological fracture: Secondary | ICD-10-CM

## 2018-12-10 DIAGNOSIS — I10 Essential (primary) hypertension: Secondary | ICD-10-CM

## 2018-12-10 NOTE — Patient Instructions (Signed)
Licensed Clinical Social Worker Visit Information  Materials Provided: No  Ms. Dudenhoeffer was given information about Chronic Care Management services today including:  1. CCM service includes personalized support from designated clinical staff supervised by her physician, including individualized plan of care and coordination with other care providers 2. 24/7 contact phone numbers for assistance for urgent and routine care needs. 3. Service will only be billed when office clinical staff spend 20 minutes or more in a month to coordinate care. 4. Only one practitioner may furnish and bill the service in a calendar month. 5. The patient may stop CCM services at any time (effective at the end of the month) by phone call to the office staff. 6. The patient will be responsible for cost sharing (co-pay) of up to 20% of the service fee (after annual deductible is met).  Patient did not agree to services and wishes to consider information provided before deciding about enrollment in care management services.   Follow up plan: LCSW to call client in next 3 weeks to talk with client further about CCM program services  The patient verbalized understanding of instructions provided today and declined a print copy of patient instruction materials.   Norva Riffle.Nolyn Eilert MSW, LCSW Licensed Clinical Social Worker [facility] Care Management [phone]

## 2018-12-10 NOTE — Chronic Care Management (AMB) (Addendum)
  Care Management Note   Kathryn Stein is a 73 y.o. year old female who is a primary care patient of Sharion Balloon, FNP. The CM team was consulted for assistance with chronic disease management and care coordination.   I reached out to Kathryn Stein by phone today.   Kathryn Stein was given information about Chronic Care Management services today including:  1. CCM service includes personalized support from designated clinical staff supervised by her physician, including individualized plan of care and coordination with other care providers 2. 24/7 contact phone numbers for assistance for urgent and routine care needs. 3. Service will only be billed when office clinical staff spend 20 minutes or more in a month to coordinate care. 4. Only one practitioner may furnish and bill the service in a calendar month. 5. The patient may stop CCM services at any time (effective at the end of the month) by phone call to the office staff. 6. The patient will be responsible for cost sharing (co-pay) of up to 20% of the service fee (after annual deductible is met). Patient did not agree to services and wishes to consider information provided before deciding about enrollment in care management services.   Kathryn Stein talked with Kathryn Stein today about CCM support services. Kathryn Stein talked with Kathryn Stein about RNCM support in nursing through [facility] program. Kathryn Stein also talked with Kathryn Stein about Kathryn Stein support in program. Kathryn Stein did not consent at this time to CCM support; however, she wants to consider program further and agreed for Kathryn Stein to call her in next 3 weeks to talk further with her about CCM program support services.  Follow Up Plan: Kathryn Stein to call Kathryn Stein in next 3 weeks to talk further with her about CCM program support.  Norva Riffle.Anthany Thornhill MSW, Kathryn Stein Licensed Clinical Social Worker [facility] Care [facility] [phone]  I have reviewed and agree with the above  documentation.   Evelina Dun, FNP

## 2018-12-17 ENCOUNTER — Encounter: Payer: Medicare Other | Admitting: *Deleted

## 2018-12-31 ENCOUNTER — Ambulatory Visit: Payer: Self-pay | Admitting: Licensed Clinical Social Worker

## 2018-12-31 DIAGNOSIS — R5383 Other fatigue: Secondary | ICD-10-CM

## 2018-12-31 DIAGNOSIS — E785 Hyperlipidemia, unspecified: Secondary | ICD-10-CM

## 2018-12-31 DIAGNOSIS — F331 Major depressive disorder, recurrent, moderate: Secondary | ICD-10-CM

## 2018-12-31 DIAGNOSIS — M810 Age-related osteoporosis without current pathological fracture: Secondary | ICD-10-CM

## 2018-12-31 DIAGNOSIS — I10 Essential (primary) hypertension: Secondary | ICD-10-CM

## 2018-12-31 DIAGNOSIS — E559 Vitamin D deficiency, unspecified: Secondary | ICD-10-CM

## 2018-12-31 NOTE — Chronic Care Management (AMB) (Addendum)
  Care Management Note   Kathryn Stein is a 73 y.o. year old female who is a primary care patient of Sharion Balloon, FNP. The CM team was consulted for assistance with chronic disease management and care coordination.   I reached out to Denyce Robert by phone today.   Ms. Urbanczyk was given information about Chronic Care Management services today including:  1. CCM service includes personalized support from designated clinical staff supervised by her physician, including individualized plan of care and coordination with other care providers 2. 24/7 contact phone numbers for assistance for urgent and routine care needs. 3. Service will only be billed when office clinical staff spend 20 minutes or more in a month to coordinate care. 4. Only one practitioner may furnish and bill the service in a calendar month. 5. The patient may stop CCM services at any time (effective at the end of the month) by phone call to the office staff. 6. The patient will be responsible for cost sharing (co-pay) of up to 20% of the service fee (after annual deductible is met). Patient did not agree to services and wishes to consider information provided before deciding about enrollment in care management services.    Review of patient status, including review of consultants reports, relevant laboratory and other test results, and collaboration with appropriate care team members and the patient's provider was performed as part of comprehensive patient evaluation and provision of chronic care management services.   LCSW talked with Denyce Robert today regarding CCM services with RN support and LCSW support. Client was not sure if she wished to start program at this time. However, she did say that she would like the [facility] to call her in next 2 weeks to talk more with client about CCM nursing program support. She wishes to learn more about this program.  LCSW thanked client for phone call on 12/31/2018 and informed  her that RNCM would be in touch with her via phone in next 2 weeks.  Client was appreciative of call from LCSW on 12/31/2018   Follow Up Plan: LCSW to call client in next 3 weeks to talk more with client about CCM program services  Norva Riffle.Jaysean Manville MSW, LCSW Licensed Clinical Social Worker [facility] Care Management [phone]  I have reviewed and agree with the above  documentation.   Evelina Dun, FNP

## 2018-12-31 NOTE — Patient Instructions (Addendum)
Licensed Clinical Social Worker Visit Information   I reached out to Kathryn Stein by phone today.   [name] was given information about Chronic Care Management services today including:  1. CCM service includes personalized support from designated clinical staff supervised by her physician, including individualized plan of care and coordination with other care providers 2. 24/7 contact phone numbers for assistance for urgent and routine care needs. 3. Service will only be billed when office clinical staff spend 20 minutes or more in a month to coordinate care. 4. Only one practitioner may furnish and bill the service in a calendar month. 5. The patient may stop CCM services at any time (effective at the end of the month) by phone call to the office staff. 6. The patient will be responsible for cost sharing (co-pay) of up to 20% of the service fee (after annual deductible is met). Patient did not agree to services and wishes to consider information provided before deciding about enrollment in care management services.    LCSW talked with Kathryn Stein today regarding CCM services with RN support and LCSW support. Client was not sure if she wished to start program at this time. However, she did say that she would like the [facility] to call her in next 2 weeks to talk more with client about CCM nursing program support. She wishes to learn more about this program.  LCSW thanked client for phone call on 12/31/2018 and informed her that RNCM would be in touch with her via phone in next 2 weeks.  Client was appreciative of call from LCSW on 12/31/2018  Materials Provided: No  Follow Up Plan: LCSW to call client in next 3 weeks to talk with her further about CCM program services support  The patient verbalized understanding of instructions provided today and declined a print copy of patient instruction materials.    Norva Riffle.Shellie Goettl MSW, LCSW Licensed Clinical Social Worker [facility] Care Management [phone]

## 2019-01-12 ENCOUNTER — Ambulatory Visit: Payer: Medicare Other | Admitting: *Deleted

## 2019-01-12 DIAGNOSIS — F331 Major depressive disorder, recurrent, moderate: Secondary | ICD-10-CM

## 2019-01-12 DIAGNOSIS — I10 Essential (primary) hypertension: Secondary | ICD-10-CM

## 2019-01-12 NOTE — Chronic Care Management (AMB) (Addendum)
  Chronic Care Management   Outreach Note  01/12/2019 Name: Kathryn Stein MRN: [id number] DOB: 12-19-1945  Referred by: Sharion Balloon, FNP Reason for referral : Chronic Care Management (RNCM Initial Visit)   Eighth telephone outreach was attempted today and there was no voicemail to leave a message on. The patient was referred to the case management team for assistance with chronic care management and care coordination in May 2020 after an AWV and the primary reason for the referral was recurrent depression.   Theadore Nan, LCSW has talked with the patient multiple times and has also made multiple unsuccessful attempts to follow up by telephone. Patient has declined services during each successful outreach. This last time she requested to speak with RNCM regarding the nursing aspects of CCM before consenting.   I reviewed her chart and she does have multiple chronic medical conditions. She is taking medications for depression, hyperlipidemia, and osteoporosis. She has not had any ER visits or hospital admissions in the past year.  She follows up with her provider regularly as planned and based on chart review, she does not appear to have any nursing related needs at this time.   The patient's primary care provider has been notified of our unsuccessful attempts to make or maintain contact with the patient. The care management team is pleased to engage with this patient at any time in the future should he/she be interested in assistance from the care management team.   Follow Up Plan:   No further attempts to reach the patient regarding CCM services will be made unless the patient makes a request for contact or the a new referral is ordered.   CCM will remain available for services if/when the patient decides they are necessary.   If patient returns call to provider office, please advise to call embedded nurse care manager, Cyril Mourning, at [phone].   Chong Sicilian BSN, RN-BC Embedded  Chronic Care Manager [facility] [place] Management Direct Dial: [phone]   I have reviewed and agree with the above documentation.   Evelina Dun, FNP

## 2019-01-12 NOTE — Patient Instructions (Signed)
Ms. Mis was given information about Chronic Care Management services today including:  1. CCM service includes personalized support from designated clinical staff supervised by her physician, including individualized plan of care and coordination with other care providers 2. 24/7 contact phone numbers for assistance for urgent and routine care needs. 3. Service will only be billed when office clinical staff spend 20 minutes or more in a month to coordinate care. 4. Only one practitioner may furnish and bill the service in a calendar month. 5. The patient may stop CCM services at any time (effective at the end of the month) by phone call to the office staff. 6. The patient will be responsible for cost sharing (co-pay) of up to 20% of the service fee (after annual deductible is met).  Patient did not agree to services and wishes to consider information provided before deciding about enrollment in care management services.     Follow Up Plan:   No further attempts to reach the patient regarding CCM services will be made unless the patient makes a request for contact or the a new referral is ordered.   CCM will remain available for services if/when the patient decides they are necessary.   If patient returns call to provider office, please advise to call embedded nurse care manager, Cyril Mourning, at [phone].   Chong Sicilian BSN, RN-BC Embedded Chronic Care Manager [facility] [place] Management Direct Dial: [phone]

## 2019-01-14 ENCOUNTER — Other Ambulatory Visit: Payer: Self-pay

## 2019-01-14 NOTE — Patient Outreach (Signed)
[facility]) Care Management  01/14/2019  Kathryn Stein Apr 30, 1946 [id number]   Medication Adherence call to Kathryn Stein Telephone call to Patient regarding Medication Adherence unable to reach patient. Kathryn Stein is showing past due on Lisinopril 10 mg and Atorvastatin 20 mg under [place].   [facility] Management Direct Dial [phone]  Fax [phone] [email or web address]

## 2019-01-19 ENCOUNTER — Other Ambulatory Visit: Payer: Self-pay

## 2019-01-19 ENCOUNTER — Encounter: Payer: Self-pay | Admitting: Nurse Practitioner

## 2019-01-19 ENCOUNTER — Other Ambulatory Visit: Payer: Self-pay | Admitting: Family

## 2019-01-19 ENCOUNTER — Telehealth: Payer: Self-pay

## 2019-01-19 ENCOUNTER — Ambulatory Visit (INDEPENDENT_AMBULATORY_CARE_PROVIDER_SITE_OTHER): Payer: Medicare Other | Admitting: Nurse Practitioner

## 2019-01-19 DIAGNOSIS — M545 Low back pain, unspecified: Secondary | ICD-10-CM

## 2019-01-19 MED ORDER — DICLOFENAC SODIUM 1 % TD GEL: 4.0000 g | Freq: Four times a day (QID) | TRANSDERMAL | 1 refills | Status: DC

## 2019-01-19 NOTE — Progress Notes (Signed)
   Virtual Visit via telephone Note Due to COVID-19 pandemic this visit was conducted virtually. This visit type was conducted due to national recommendations for restrictions regarding the COVID-19 Pandemic (e.g. social distancing, sheltering in place) in an effort to limit this patient's exposure and mitigate transmission in our community. All issues noted in this document were discussed and addressed.  A physical exam was not performed with this format.  I connected with Kathryn Stein on 01/19/19 at 11:55 by telephone and verified that I am speaking with the correct person using two identifiers. Kathryn Stein is currently located at home and her daughter is currently with her during visit. The provider, Mary-Margaret Hassell Done, FNP is located in their office at time of visit.  I discussed the limitations, risks, security and privacy concerns of performing an evaluation and management service by telephone and the availability of in person appointments. I also discussed with the patient that there may be a patient responsible charge related to this service. The patient expressed understanding and agreed to proceed.   History and Present Illness:   Chief Complaint: Back Pain   HPI Patient was working around the house and started having back pain yesterday. Pain ois in the middle of er back and goes around to the left side and down to hip bone. Rates pain 8/10 right now. Nothing seems to help it. She has not taken anything for it.    Review of Systems  Constitutional: Negative for diaphoresis and weight loss.  Eyes: Negative for blurred vision, double vision and pain.  Respiratory: Negative for shortness of breath.   Cardiovascular: Negative for chest pain, palpitations, orthopnea and leg swelling.  Gastrointestinal: Negative for abdominal pain.  Musculoskeletal: Positive for back pain.  Skin: Negative for rash.  Neurological: Negative for dizziness, sensory change, loss of consciousness,  weakness and headaches.  Endo/Heme/Allergies: Negative for polydipsia. Does not bruise/bleed easily.  Psychiatric/Behavioral: Negative for memory loss. Substance abuse:   The patient does not have insomnia.   All other systems reviewed and are negative.    Observations/Objective: Alert and oriented- answers all questions appropriately No distress    Assessment and Plan: Kathryn Stein in today with chief complaint of Back Pain   1. Acute midline low back pain without sciatica Moist heat  Rest No lifting - diclofenac sodium (VOLTAREN) 1 % GEL; Apply 4 g topically 4 (four) times daily.  Dispense: 350 g; Refill: 1   Follow Up Instructions: With PCP if not getting any better    I discussed the assessment and treatment plan with the patient. The patient was provided an opportunity to ask questions and all were answered. The patient agreed with the plan and demonstrated an understanding of the instructions.   The patient was advised to call back or seek an in-person evaluation if the symptoms worsen or if the condition fails to improve as anticipated.  The above assessment and management plan was discussed with the patient. The patient verbalized understanding of and has agreed to the management plan. Patient is aware to call the clinic if symptoms persist or worsen. Patient is aware when to return to the clinic for a follow-up visit. Patient educated on when it is appropriate to go to the emergency department.   Time call ended:  12:08  I provided 13 minutes of non-face-to-face time during this encounter.    Mary-Margaret Hassell Done, FNP

## 2019-01-19 NOTE — Telephone Encounter (Signed)
Televisit appt made.  

## 2019-01-21 ENCOUNTER — Ambulatory Visit (INDEPENDENT_AMBULATORY_CARE_PROVIDER_SITE_OTHER): Payer: Medicare Other | Admitting: Family Medicine

## 2019-01-21 NOTE — Progress Notes (Signed)
Called patient 3 times, no answer, no voicemail set up.

## 2019-01-27 ENCOUNTER — Other Ambulatory Visit: Payer: Self-pay

## 2019-01-27 ENCOUNTER — Ambulatory Visit (INDEPENDENT_AMBULATORY_CARE_PROVIDER_SITE_OTHER): Payer: Medicare Other | Admitting: Family

## 2019-01-27 ENCOUNTER — Encounter: Payer: Self-pay | Admitting: Family

## 2019-01-27 ENCOUNTER — Ambulatory Visit (INDEPENDENT_AMBULATORY_CARE_PROVIDER_SITE_OTHER): Payer: Medicare Other

## 2019-01-27 VITALS — BP 181/86 | HR 66 | Temp 96.8°F | Resp 20 | Ht 62.0 in | Wt 137.4 lb

## 2019-01-27 DIAGNOSIS — M545 Low back pain, unspecified: Secondary | ICD-10-CM

## 2019-01-27 LAB — URINALYSIS, COMPLETE
Bilirubin, UA: NEGATIVE
Glucose, UA: NEGATIVE
Leukocytes,UA: NEGATIVE
Nitrite, UA: NEGATIVE
Protein,UA: NEGATIVE
Specific Gravity, UA: >1.03 — ABNORMAL HIGH (ref 1.005–1.030)
Urobilinogen, Ur: 0.2 mg/dL (ref 0.2–1.0)
pH, UA: 5.5 (ref 5.0–7.5)

## 2019-01-27 LAB — MICROSCOPIC EXAMINATION: Renal Epithel, UA: NONE SEEN /HPF

## 2019-01-27 MED ORDER — DICLOFENAC SODIUM 75 MG PO TBEC: 75.0000 mg | DELAYED_RELEASE_TABLET | Freq: Two times a day (BID) | ORAL | 3 refills | Status: DC

## 2019-01-27 NOTE — Patient Instructions (Signed)

## 2019-01-27 NOTE — Progress Notes (Signed)
Subjective:    Patient ID: Kathryn Stein, female    DOB: 02/03/1946, 73 y.o.   MRN: [id number]  Chief Complaint  Patient presents with  . left back pain radiates toward abdomen    Back Pain This is a recurrent problem. The current episode started 1 to 4 weeks ago. The problem occurs intermittently. The problem has been waxing and waning since onset. The pain is present in the lumbar spine. The quality of the pain is described as aching. The pain radiates to the left thigh. The pain is moderate. Exacerbated by: walking. Associated symptoms include leg pain and weakness. Pertinent negatives include no bladder incontinence, bowel incontinence, dysuria or fever. Risk factors include menopause and obesity. She has tried NSAIDs and walking for the symptoms. The treatment provided mild relief.      Review of Systems  Constitutional: Negative for fever.  Gastrointestinal: Negative for bowel incontinence.  Genitourinary: Negative for bladder incontinence and dysuria.  Musculoskeletal: Positive for back pain.  Neurological: Positive for weakness.  All other systems reviewed and are negative.      Objective:   Physical Exam Vitals signs reviewed.  Constitutional:      General: She is not in acute distress.    Appearance: She is well-developed.  HENT:     Head: Normocephalic and atraumatic.  Eyes:     Pupils: Pupils are equal, round, and reactive to light.  Neck:     Musculoskeletal: Normal range of motion and neck supple.     Thyroid: No thyromegaly.  Cardiovascular:     Rate and Rhythm: Normal rate and regular rhythm.     Heart sounds: Normal heart sounds. No murmur.  Pulmonary:     Effort: Pulmonary effort is normal. No respiratory distress.     Breath sounds: Normal breath sounds. No wheezing.  Abdominal:     General: Bowel sounds are normal. There is no distension.     Palpations: Abdomen is soft.     Tenderness: There is abdominal tenderness (left lower ).   Musculoskeletal:        General: No tenderness.     Comments: Pain left lumbar with flexion  Skin:    General: Skin is warm and dry.  Neurological:     Mental Status: She is alert and oriented to person, place, and time.     Cranial Nerves: No cranial nerve deficit.     Deep Tendon Reflexes: Reflexes are normal and symmetric.  Psychiatric:        Behavior: Behavior normal.        Thought Content: Thought content normal.        Judgment: Judgment normal.     BP (!) 181/86   Pulse 66   Temp (!) 96.8 F (36 C) (Temporal)   Resp 20   Ht '5\' 2"'$  (1.575 m)   Wt 137 lb 6.4 oz (62.3 kg)   SpO2 99%   BMI 25.13 kg/m      Assessment & Plan:  Kathryn Stein comes in today with chief complaint of left back pain radiates toward abdomen   Diagnosis and orders addressed:  1. Acute left-sided low back pain without sciatica Urine still pending If positive will send in antibiotic Rest ROM exercises discussed RTO if symptoms worsen or do not improve  - Urinalysis, Complete - DG Lumbar Spine 2-3 Views; Future - CMP14+EGFR - CBC with Differential/Platelet - diclofenac (VOLTAREN) 75 MG EC tablet; Take 1 tablet (75 mg total) by mouth 2 (  two) times daily.  Dispense: 60 tablet; Refill: [place], FNP

## 2019-01-28 LAB — CBC WITH DIFFERENTIAL/PLATELET
Basophils Absolute: 0 10*3/uL (ref 0.0–0.2)
Basos: 1 %
EOS (ABSOLUTE): 0 10*3/uL (ref 0.0–0.4)
Eos: 1 %
Hematocrit: 39.2 % (ref 34.0–46.6)
Hemoglobin: 12.8 g/dL (ref 11.1–15.9)
Immature Grans (Abs): 0 10*3/uL (ref 0.0–0.1)
Immature Granulocytes: 0 %
Lymphocytes Absolute: 1.8 10*3/uL (ref 0.7–3.1)
Lymphs: 41 %
MCH: 29.7 pg (ref 26.6–33.0)
MCHC: 32.7 g/dL (ref 31.5–35.7)
MCV: 91 fL (ref 79–97)
Monocytes Absolute: 0.4 10*3/uL (ref 0.1–0.9)
Monocytes: 8 %
Neutrophils Absolute: 2.2 10*3/uL (ref 1.4–7.0)
Neutrophils: 49 %
Platelets: 330 10*3/uL (ref 150–450)
RBC: 4.31 x10E6/uL (ref 3.77–5.28)
RDW: 12.6 % (ref 11.7–15.4)
WBC: 4.4 10*3/uL (ref 3.4–10.8)

## 2019-01-28 LAB — CMP14+EGFR
ALT: 11 IU/L (ref 0–32)
AST: 16 IU/L (ref 0–40)
Albumin/Globulin Ratio: 2 (ref 1.2–2.2)
Albumin: 4.5 g/dL (ref 3.7–4.7)
Alkaline Phosphatase: 67 IU/L (ref 39–117)
BUN/Creatinine Ratio: 27 (ref 12–28)
BUN: 22 mg/dL (ref 8–27)
Bilirubin Total: 0.6 mg/dL (ref 0.0–1.2)
CO2: 26 mmol/L (ref 20–29)
Calcium: 9.8 mg/dL (ref 8.7–10.3)
Chloride: 103 mmol/L (ref 96–106)
Creatinine, Ser: 0.81 mg/dL (ref 0.57–1.00)
GFR calc Af Amer: 84 mL/min/{1.73_m2} (ref >59)
GFR calc non Af Amer: 73 mL/min/{1.73_m2} (ref >59)
Globulin, Total: 2.2 g/dL (ref 1.5–4.5)
Glucose: 76 mg/dL (ref 65–99)
Potassium: 4.3 mmol/L (ref 3.5–5.2)
Sodium: 142 mmol/L (ref 134–144)
Total Protein: 6.7 g/dL (ref 6.0–8.5)

## 2019-02-01 ENCOUNTER — Encounter: Payer: Self-pay | Admitting: *Deleted

## 2019-02-08 ENCOUNTER — Other Ambulatory Visit: Payer: Self-pay | Admitting: Family

## 2019-02-22 ENCOUNTER — Other Ambulatory Visit: Payer: Self-pay

## 2019-02-22 NOTE — Patient Outreach (Signed)
[facility]) Care Management  02/22/2019  Kathryn Stein November 26, 1945 [id number]   Medication Adherence call to Kathryn Stein Telephone call to Patient regarding Medication Adherence unable to reach patient. Kathryn Stein is showing past due on Atorvastatin 20 mg under [place].   [facility] Management Direct Dial [phone]  Fax [phone] [email or web address]

## 2019-03-07 ENCOUNTER — Encounter: Payer: Medicare Other | Admitting: Family

## 2019-03-07 ENCOUNTER — Encounter: Payer: Self-pay | Admitting: Family

## 2019-03-07 ENCOUNTER — Other Ambulatory Visit: Payer: Self-pay

## 2019-03-07 DIAGNOSIS — B9689 Other specified bacterial agents as the cause of diseases classified elsewhere: Secondary | ICD-10-CM

## 2019-03-07 MED ORDER — BENZONATATE 200 MG PO CAPS: 200.0000 mg | ORAL_CAPSULE | Freq: Three times a day (TID) | ORAL | 1 refills | Status: DC | PRN

## 2019-03-07 MED ORDER — DOXYCYCLINE HYCLATE 100 MG PO TABS: 100.0000 mg | ORAL_TABLET | Freq: Two times a day (BID) | ORAL | 0 refills | Status: DC

## 2019-03-07 NOTE — Progress Notes (Addendum)
Virtual Visit via telephone Note Due to COVID-19 pandemic this visit was conducted virtually. This visit type was conducted due to national recommendations for restrictions regarding the COVID-19 Pandemic (e.g. social distancing, sheltering in place) in an effort to limit this patient's exposure and mitigate transmission in our community. All issues noted in this document were discussed and addressed.  A physical exam was not performed with this format.  Attempted to call patient at 11:24-11:27 AM, number is disconnected. No other number to contact patient.   I connected with Kathryn Stein on 03/07/19 at 1:10 pm by telephone and verified that I am speaking with the correct person using two identifiers. Kathryn Stein is currently located at home and husband  is currently with her during visit. The provider, Evelina Dun, FNP is located in their office at time of visit.  I discussed the limitations, risks, security and privacy concerns of performing an evaluation and management service by telephone and the availability of in person appointments. I also discussed with the patient that there may be a patient responsible charge related to this service. The patient expressed understanding and agreed to proceed.   History and Present Illness:  Cough This is a new problem. The current episode started in the past 7 days. The problem has been gradually worsening. The problem occurs every few minutes. The cough is productive of purulent sputum. Associated symptoms include headaches, myalgias and rhinorrhea. Pertinent negatives include no chills, ear congestion, ear pain, fever, nasal congestion or postnasal drip. She has tried rest and OTC cough suppressant for the symptoms. The treatment provided mild relief.      Review of Systems  Constitutional: Negative for chills and fever.  HENT: Positive for rhinorrhea. Negative for ear pain and postnasal drip.   Respiratory: Positive for cough.    Musculoskeletal: Positive for myalgias.  Neurological: Positive for headaches.  All other systems reviewed and are negative.    Observations/Objective: No SOB or distress noted  Assessment and Plan: 1. Acute bacterial bronchitis Pt will go and get COVID tested to rule out - Take meds as prescribed - Use a cool mist humidifier  -Use saline nose sprays frequently -Force fluids -For any cough or congestion  Use plain Mucinex- regular strength or max strength is fine -For fever or aces or pains- take tylenol or ibuprofen Call if symptoms worsen or do not improve  - doxycycline (VIBRA-TABS) 100 MG tablet; Take 1 tablet (100 mg total) by mouth 2 (two) times daily.  Dispense: 20 tablet; Refill: 0 - benzonatate (TESSALON) 200 MG capsule; Take 1 capsule (200 mg total) by mouth 3 (three) times daily as needed.  Dispense: 30 capsule; Refill: 1     I discussed the assessment and treatment plan with the patient. The patient was provided an opportunity to ask questions and all were answered. The patient agreed with the plan and demonstrated an understanding of the instructions.   The patient was advised to call back or seek an in-person evaluation if the symptoms worsen or if the condition fails to improve as anticipated.  The above assessment and management plan was discussed with the patient. The patient verbalized understanding of and has agreed to the management plan. Patient is aware to call the clinic if symptoms persist or worsen. Patient is aware when to return to the clinic for a follow-up visit. Patient educated on when it is appropriate to go to the emergency department.   Time call ended:  1:22 pm  I  provided 12 minutes of non-face-to-face time during this encounter.    Evelina Dun, FNP

## 2019-03-07 NOTE — Addendum Note (Signed)
Addended by: Evelina Dun A on: 03/07/2019 01:23 PM   Modules accepted: Orders, Level of Service

## 2019-03-08 ENCOUNTER — Other Ambulatory Visit: Payer: Self-pay | Admitting: *Deleted

## 2019-03-08 DIAGNOSIS — Z20822 Contact with and (suspected) exposure to covid-19: Secondary | ICD-10-CM

## 2019-03-09 LAB — NOVEL CORONAVIRUS, NAA: SARS-CoV-2, NAA: NOT DETECTED

## 2019-03-13 ENCOUNTER — Emergency Department (HOSPITAL_COMMUNITY): Payer: Medicare Other

## 2019-03-13 ENCOUNTER — Emergency Department (HOSPITAL_COMMUNITY)
Admission: EM | Admit: 2019-03-13 | Discharge: 2019-03-14 | Disposition: A | Discharge status: Home / Self Care | Payer: Medicare Other | Attending: Emergency Medicine | Admitting: Emergency Medicine

## 2019-03-13 ENCOUNTER — Other Ambulatory Visit: Payer: Self-pay

## 2019-03-13 ENCOUNTER — Encounter (HOSPITAL_COMMUNITY): Payer: Self-pay | Admitting: Emergency Medicine

## 2019-03-13 DIAGNOSIS — I10 Essential (primary) hypertension: Secondary | ICD-10-CM | POA: Diagnosis not present

## 2019-03-13 DIAGNOSIS — Z79899 Other long term (current) drug therapy: Secondary | ICD-10-CM | POA: Diagnosis not present

## 2019-03-13 DIAGNOSIS — K529 Noninfective gastroenteritis and colitis, unspecified: Secondary | ICD-10-CM | POA: Diagnosis not present

## 2019-03-13 DIAGNOSIS — R1110 Vomiting, unspecified: Secondary | ICD-10-CM | POA: Diagnosis present

## 2019-03-13 LAB — CBC
HCT: 42.7 % (ref 36.0–46.0)
Hemoglobin: 13.3 g/dL (ref 12.0–15.0)
MCH: 28.9 pg (ref 26.0–34.0)
MCHC: 31.1 g/dL (ref 30.0–36.0)
MCV: 92.6 fL (ref 80.0–100.0)
Platelets: 316 10*3/uL (ref 150–400)
RBC: 4.61 MIL/uL (ref 3.87–5.11)
RDW: 13.9 % (ref 11.5–15.5)
WBC: 5.1 10*3/uL (ref 4.0–10.5)
nRBC: 0 % (ref 0.0–0.2)

## 2019-03-13 LAB — BASIC METABOLIC PANEL
Anion gap: 11 (ref 5–15)
BUN: 17 mg/dL (ref 8–23)
CO2: 22 mmol/L (ref 22–32)
Calcium: 9.2 mg/dL (ref 8.9–10.3)
Chloride: 104 mmol/L (ref 98–111)
Creatinine, Ser: 0.57 mg/dL (ref 0.44–1.00)
GFR calc Af Amer: >60 mL/min (ref >60)
GFR calc non Af Amer: >60 mL/min (ref >60)
Glucose, Bld: 102 mg/dL — ABNORMAL HIGH (ref 70–99)
Potassium: 3.8 mmol/L (ref 3.5–5.1)
Sodium: 137 mmol/L (ref 135–145)

## 2019-03-13 LAB — LIPASE, BLOOD: Lipase: 20 U/L (ref 11–51)

## 2019-03-13 MED ORDER — IOHEXOL 300 MG/ML  SOLN
100.0000 mL | Freq: Once | INTRAMUSCULAR | Status: AC | PRN
  Administered 2019-03-14: 100 mL via INTRAVENOUS

## 2019-03-13 MED ORDER — ONDANSETRON 4 MG PO TBDP
4.0000 mg | ORAL_TABLET | Freq: Once | ORAL | Status: AC
  Administered 2019-03-13: 4 mg via ORAL
  Filled 2019-03-13: qty 1

## 2019-03-13 MED ORDER — MORPHINE SULFATE (PF) 2 MG/ML IV SOLN
2.0000 mg | Freq: Once | INTRAVENOUS | Status: AC
  Administered 2019-03-13: 2 mg via INTRAVENOUS
  Filled 2019-03-13: qty 1

## 2019-03-13 MED ORDER — SODIUM CHLORIDE 0.9 % IV BOLUS
1000.0000 mL | Freq: Once | INTRAVENOUS | Status: AC
  Administered 2019-03-13: 1000 mL via INTRAVENOUS

## 2019-03-13 MED ORDER — ONDANSETRON HCL 4 MG/2ML IJ SOLN
4.0000 mg | Freq: Once | INTRAMUSCULAR | Status: AC
  Administered 2019-03-13: 4 mg via INTRAVENOUS
  Filled 2019-03-13: qty 2

## 2019-03-13 NOTE — ED Triage Notes (Signed)
Patient states that she has vomited 6 times today. Patient states abdominal pain at this time.

## 2019-03-13 NOTE — ED Provider Notes (Signed)
[facility] EMERGENCY DEPARTMENT Provider Note   CSN: [id number] Arrival date & time: 03/13/19  2052     History   Chief Complaint Chief Complaint  Patient presents with  . Emesis    HPI Kathryn Stein is a 73 y.o. female.     Patient is a 73 year old female with past medical history of hypertension, hyperlipidemia, GERD, depression.  She presents today for evaluation of abdominal pain and vomiting.  She reports vomiting 6 times since this morning.  She describes generalized abdominal cramping and pain to the left side of her abdomen.  She denies any fevers or chills.  She denies any bloody stool or vomit.  She denies any diarrhea she denies having had a bowel movement today.  The history is provided by the patient.  Emesis Severity:  Moderate Duration:  12 hours Timing:  Constant Quality:  Stomach contents Progression:  Worsening Chronicity:  New Recent urination:  Normal Relieved by:  Nothing Worsened by:  Nothing Ineffective treatments:  None tried Associated symptoms: abdominal pain   Associated symptoms: no fever     Past Medical History:  Diagnosis Date  . Abnormal heart rate    after birth of 3rd child  . Anemia   . Anorexia   . Anxiety   . Arthritis   . Colon polyps   . Depression   . Dry cough    on and off  . Dyspepsia   . Gastritis    found on EGD  . GERD (gastroesophageal reflux disease)   . Hyperlipidemia   . Hypertension   . Marital relationship problem   . Osteoporosis   . Postmenopausal   . Vertigo   . Vertigo due to previous cerebellar infarction     Patient Active Problem List   Diagnosis Date Noted  . Underweight 11/01/2015  . Essential hypertension, benign 11/22/2013  . Vitamin D deficiency 11/04/2013  . HLD (hyperlipidemia) 07/29/2013  . Headache(784.0) 05/26/2013  . Anorexia symptom 01/18/2013  . Back pain 01/18/2013  . Depression 07/11/2012  . Osteoporosis 07/11/2012  . Personal history of colonic polyps 07/11/2012     Past Surgical History:  Procedure Laterality Date  . BREAST SURGERY Left    biopsy benign  . CESAREAN SECTION     per pt/ no c-section/had 5 vaginal births  . COLONOSCOPY    . MOUTH SURGERY     bone extraction  . TUBAL LIGATION       OB History   No obstetric history on file.      Home Medications    Prior to Admission medications   Medication Sig Start Date End Date Taking? Authorizing Provider  aspirin EC 81 MG tablet Take 81 mg by mouth daily as needed. For pain    [provider]  atorvastatin (LIPITOR) 20 MG tablet Take 1 tablet by mouth once daily 02/08/19   Evelina Dun A, FNP  benzonatate (TESSALON) 200 MG capsule Take 1 capsule (200 mg total) by mouth 3 (three) times daily as needed. 03/07/19   Sharion Balloon, FNP  citalopram (CELEXA) 40 MG tablet Take 1 tablet (40 mg total) by mouth daily. 04/12/18   Sharion Balloon, FNP  cyproheptadine (PERIACTIN) 4 MG tablet Take 1 tablet (4 mg total) by mouth 3 (three) times daily as needed. 11/26/17   Sharion Balloon, FNP  diclofenac (VOLTAREN) 75 MG EC tablet Take 1 tablet (75 mg total) by mouth 2 (two) times daily. 01/27/19   Sharion Balloon, FNP  diclofenac sodium (VOLTAREN) 1 % GEL Apply 4 g topically 4 (four) times daily. 01/19/19   Hassell Done, Mary-Margaret, FNP  doxycycline (VIBRA-TABS) 100 MG tablet Take 1 tablet (100 mg total) by mouth 2 (two) times daily. 03/07/19   Sharion Balloon, FNP  fluticasone (FLONASE) 50 MCG/ACT nasal spray Place 2 sprays into both nostrils daily. 04/17/15   Sharion Balloon, FNP  lisinopril (ZESTRIL) 10 MG tablet Take 1 tablet by mouth once daily 09/16/18   Evelina Dun A, FNP  Omega-3 Fatty Acids (FISH OIL PO) Take by mouth. Take one pill daily    [provider]  raloxifene (EVISTA) 60 MG tablet TAKE 1 TABLET BY MOUTH ONCE A DAY FOR BONES 11/26/17   Evelina Dun A, FNP  Vitamin D, Ergocalciferol, (DRISDOL) 50000 units CAPS capsule Take 1 capsule (50,000 Units total) by mouth every  7 (seven) days. 08/13/17   Sharion Balloon, FNP    Family History Family History  Problem Relation Age of Onset  . Colon cancer Mother   . Diabetes Mother   . Cancer Mother   . Heart disease Mother   . Hypertension Mother   . Stroke Mother   . Cancer Brother        throat - smoker  . Kidney disease Brother   . Prostate cancer Brother   . Heart disease Sister   . Cancer Sister        breast  . Diabetes Sister   . Liver disease Brother   . Diabetes Brother   . Hypertension Brother   . Drug abuse Brother   . Stroke Father 75  . Diabetes Sister   . Heart disease Sister   . Hypertension Sister   . Diabetes Sister   . Cancer Sister   . Cancer Brother        prostate  . Stomach cancer Other        niece  . Kidney cancer Other        niece  . Cancer Daughter        kidney  . Alcohol abuse Brother   . Early death Sister   . Heart disease Sister   . Hypertension Sister   . Hypertension Sister   . Diabetes Sister   . Mental illness Daughter     Social History Social History   Tobacco Use  . Smoking status: Never Smoker  . Smokeless tobacco: Never Used  Substance Use Topics  . Alcohol use: No  . Drug use: No     Allergies   Codeine and Darvocet [propoxyphene n-acetaminophen]   Review of Systems Review of Systems  Constitutional: Negative for fever.  Gastrointestinal: Positive for abdominal pain and vomiting.  All other systems reviewed and are negative.    Physical Exam Updated Vital Signs BP (!) 173/81   Pulse 70   Temp 98.1 F (36.7 C)   Resp 16   Ht 5\' 2"  (1.575 m)   Wt 56.7 kg   SpO2 99%   BMI 22.86 kg/m   Physical Exam Vitals signs and nursing note reviewed.  Constitutional:      General: She is not in acute distress.    Appearance: She is well-developed. She is not diaphoretic.  HENT:     Head: Normocephalic and atraumatic.  Neck:     Musculoskeletal: Normal range of motion and neck supple.  Cardiovascular:     Rate and Rhythm:  Normal rate and regular rhythm.     Heart sounds: No  murmur. No friction rub. No gallop.   Pulmonary:     Effort: Pulmonary effort is normal. No respiratory distress.     Breath sounds: Normal breath sounds. No wheezing.  Abdominal:     General: Bowel sounds are normal. There is no distension.     Palpations: Abdomen is soft.     Tenderness: There is abdominal tenderness. There is no right CVA tenderness, left CVA tenderness, guarding or rebound.     Comments: There is mild tenderness to the left upper and left lower quadrant.  Musculoskeletal: Normal range of motion.  Skin:    General: Skin is warm and dry.  Neurological:     Mental Status: She is alert and oriented to person, place, and time.      ED Treatments / Results  Labs (all labs ordered are listed, but only abnormal results are displayed) Labs Reviewed  CBC  BASIC METABOLIC PANEL  LIPASE, BLOOD  URINALYSIS, ROUTINE W REFLEX MICROSCOPIC    EKG None  Radiology No results found.  Procedures Procedures (including critical care time)  Medications Ordered in ED Medications  sodium chloride 0.9 % bolus 1,000 mL (has no administration in time range)  ondansetron (ZOFRAN) injection 4 mg (has no administration in time range)  morphine 2 MG/ML injection 2 mg (has no administration in time range)  ondansetron (ZOFRAN-ODT) disintegrating tablet 4 mg (4 mg Oral Given 03/13/19 2116)     Initial Impression / Assessment and Plan / ED Course  I have reviewed the triage vital signs and the nursing notes.  Pertinent labs & imaging results that were available during my care of the patient were reviewed by me and considered in my medical decision making (see chart for details).  Patient presents here with complaints of abdominal cramping and vomiting that appears viral in nature.  Her CBC and electrolytes are normal and urinalysis is clear.  CT scan of the abdomen and pelvis was obtained that shows no acute intra-abdominal  process.  Patient feeling better after IV fluids and medications given in the ER.  At this point, I feel as though discharge is appropriate.  She will be given Zofran and advised to follow a clear liquid diet for the next 12 hours and advance as tolerated.  To return as needed if symptoms worsen or change.  Final Clinical Impressions(s) / ED Diagnoses   Final diagnoses:  None    ED Discharge Orders    None       Veryl Speak, MD 03/14/19 0111

## 2019-03-14 LAB — URINALYSIS, ROUTINE W REFLEX MICROSCOPIC
Bacteria, UA: NONE SEEN
Bilirubin Urine: NEGATIVE
Glucose, UA: NEGATIVE mg/dL
Hgb urine dipstick: NEGATIVE
Ketones, ur: 80 mg/dL — AB
Leukocytes,Ua: NEGATIVE
Nitrite: NEGATIVE
Protein, ur: 30 mg/dL — AB
Specific Gravity, Urine: 1.028 (ref 1.005–1.030)
pH: 5 (ref 5.0–8.0)

## 2019-03-14 MED ORDER — ONDANSETRON HCL 8 MG PO TABS: 8.0000 mg | ORAL_TABLET | ORAL | 0 refills | Status: DC | PRN

## 2019-03-14 NOTE — Discharge Instructions (Addendum)
Begin taking Zofran as prescribed as needed for nausea.  Clear liquid diet for the next 12 hours, then slowly advance to normal as tolerated.  Return to the emergency department if you develop worsening pain, high fever, bloody stool or vomit, or other new and concerning symptoms.

## 2019-03-17 ENCOUNTER — Ambulatory Visit (INDEPENDENT_AMBULATORY_CARE_PROVIDER_SITE_OTHER): Payer: Medicare Other | Admitting: Family

## 2019-03-17 ENCOUNTER — Encounter: Payer: Self-pay | Admitting: Family

## 2019-03-17 ENCOUNTER — Encounter (HOSPITAL_COMMUNITY): Payer: Self-pay | Admitting: Emergency Medicine

## 2019-03-17 ENCOUNTER — Other Ambulatory Visit: Payer: Self-pay

## 2019-03-17 ENCOUNTER — Emergency Department (HOSPITAL_COMMUNITY)
Admission: EM | Admit: 2019-03-17 | Discharge: 2019-03-17 | Disposition: A | Discharge status: Home / Self Care | Payer: Medicare Other | Attending: Emergency Medicine | Admitting: Emergency Medicine

## 2019-03-17 DIAGNOSIS — R1084 Generalized abdominal pain: Secondary | ICD-10-CM | POA: Diagnosis not present

## 2019-03-17 DIAGNOSIS — R6883 Chills (without fever): Secondary | ICD-10-CM | POA: Diagnosis not present

## 2019-03-17 DIAGNOSIS — Z5321 Procedure and treatment not carried out due to patient leaving prior to being seen by health care provider: Secondary | ICD-10-CM | POA: Diagnosis not present

## 2019-03-17 DIAGNOSIS — K5900 Constipation, unspecified: Secondary | ICD-10-CM | POA: Diagnosis not present

## 2019-03-17 DIAGNOSIS — K2900 Acute gastritis without bleeding: Secondary | ICD-10-CM | POA: Diagnosis not present

## 2019-03-17 DIAGNOSIS — Z09 Encounter for follow-up examination after completed treatment for conditions other than malignant neoplasm: Secondary | ICD-10-CM

## 2019-03-17 DIAGNOSIS — R109 Unspecified abdominal pain: Secondary | ICD-10-CM | POA: Diagnosis present

## 2019-03-17 LAB — COMPREHENSIVE METABOLIC PANEL
ALT: 16 U/L (ref 0–44)
AST: 25 U/L (ref 15–41)
Albumin: 4.6 g/dL (ref 3.5–5.0)
Alkaline Phosphatase: 58 U/L (ref 38–126)
Anion gap: 10 (ref 5–15)
BUN: 9 mg/dL (ref 8–23)
CO2: 28 mmol/L (ref 22–32)
Calcium: 9.2 mg/dL (ref 8.9–10.3)
Chloride: 100 mmol/L (ref 98–111)
Creatinine, Ser: 0.75 mg/dL (ref 0.44–1.00)
GFR calc Af Amer: >60 mL/min (ref >60)
GFR calc non Af Amer: >60 mL/min (ref >60)
Glucose, Bld: 88 mg/dL (ref 70–99)
Potassium: 3.4 mmol/L — ABNORMAL LOW (ref 3.5–5.1)
Sodium: 138 mmol/L (ref 135–145)
Total Bilirubin: 1 mg/dL (ref 0.3–1.2)
Total Protein: 6.9 g/dL (ref 6.5–8.1)

## 2019-03-17 LAB — CBC
HCT: 39.8 % (ref 36.0–46.0)
Hemoglobin: 12.8 g/dL (ref 12.0–15.0)
MCH: 29.8 pg (ref 26.0–34.0)
MCHC: 32.2 g/dL (ref 30.0–36.0)
MCV: 92.6 fL (ref 80.0–100.0)
Platelets: 320 10*3/uL (ref 150–400)
RBC: 4.3 MIL/uL (ref 3.87–5.11)
RDW: 13.3 % (ref 11.5–15.5)
WBC: 4.2 10*3/uL (ref 4.0–10.5)
nRBC: 0 % (ref 0.0–0.2)

## 2019-03-17 LAB — LIPASE, BLOOD: Lipase: 22 U/L (ref 11–51)

## 2019-03-17 NOTE — Progress Notes (Signed)
Virtual Visit via telephone Note Due to COVID-19 pandemic this visit was conducted virtually. This visit type was conducted due to national recommendations for restrictions regarding the COVID-19 Pandemic (e.g. social distancing, sheltering in place) in an effort to limit this patient's exposure and mitigate transmission in our community. All issues noted in this document were discussed and addressed.  A physical exam was not performed with this format.  I connected with Kathryn Stein on 03/17/19 at 11:35  by telephone and verified that I am speaking with the correct person using two identifiers. Kathryn Stein is currently located at home and husband is currently with her during visit. The provider, Evelina Dun, FNP is located in their office at time of visit.  I discussed the limitations, risks, security and privacy concerns of performing an evaluation and management service by telephone and the availability of in person appointments. I also discussed with the patient that there may be a patient responsible charge related to this service. The patient expressed understanding and agreed to proceed.   History and Present Illness:  HPI  PT calls the office today for ED follow up. She went to the ED on 03/13/19 for stomach cramping and vomiting. Her CBC, urinalysis, and electrolytes were stable. She had a negative CT scan. She was given IV fluids and discharged on Zofran.  '  She reports she has not vomited since Monday. She continues to have a decreased appetite and weak. She has not ate much. She states every times she eats she feels a "lump in her throat". She reports constipation, burping. She took a  laxative Sunday then had a BM on Tuesday.  Since Tuesday she has not had a BM or passed any gas. She reports constant aching generalized  pain in her stomach of 7 out 10 and it is worse if she eats anything.   She reports chills and "feeling hot".   Review of Systems  Constitutional: Positive  for fever and malaise/fatigue.  Gastrointestinal: Positive for abdominal pain, constipation, heartburn and nausea.  Genitourinary: Negative for dysuria, frequency and hematuria.  Neurological: Positive for weakness.     Observations/Objective: No SOB or distress noted,   Assessment and Plan: 1. Hospital discharge follow-up  2. Acute gastritis without hemorrhage, unspecified gastritis type  3. Generalized abdominal pain  4. Chills  5. Constipation, unspecified constipation type  Given symptoms of no BM or gas with increasing stomach pain, I advised patient to go to ED. Worrisome for a blockage. When asked to push on her stomach she states her pain is increased. Her age and decrease input puts her at risk for dehydration also.   She agrees and will go to the ED.        I discussed the assessment and treatment plan with the patient. The patient was provided an opportunity to ask questions and all were answered. The patient agreed with the plan and demonstrated an understanding of the instructions.   The patient was advised to call back or seek an in-person evaluation if the symptoms worsen or if the condition fails to improve as anticipated.  The above assessment and management plan was discussed with the patient. The patient verbalized understanding of and has agreed to the management plan. Patient is aware to call the clinic if symptoms persist or worsen. Patient is aware when to return to the clinic for a follow-up visit. Patient educated on when it is appropriate to go to the emergency department.   Time call  ended:  11:55 AM  I provided 20 minutes of non-face-to-face time during this encounter.    Evelina Dun, FNP

## 2019-03-17 NOTE — ED Triage Notes (Signed)
Pt states that she was sent by her doctor to see if she has a SBO. Pt states that her stomach hurts when she eats and she has not been able to have a BM

## 2019-03-18 ENCOUNTER — Other Ambulatory Visit: Payer: Self-pay | Admitting: Family

## 2019-03-18 DIAGNOSIS — I10 Essential (primary) hypertension: Secondary | ICD-10-CM

## 2019-03-19 ENCOUNTER — Emergency Department (HOSPITAL_COMMUNITY)
Admission: EM | Admit: 2019-03-19 | Discharge: 2019-03-19 | Disposition: A | Discharge status: Home / Self Care | Payer: Medicare Other | Attending: Emergency Medicine | Admitting: Emergency Medicine

## 2019-03-19 ENCOUNTER — Encounter (HOSPITAL_COMMUNITY): Payer: Self-pay

## 2019-03-19 ENCOUNTER — Emergency Department (HOSPITAL_COMMUNITY): Payer: Medicare Other

## 2019-03-19 ENCOUNTER — Other Ambulatory Visit: Payer: Self-pay

## 2019-03-19 DIAGNOSIS — I10 Essential (primary) hypertension: Secondary | ICD-10-CM | POA: Diagnosis not present

## 2019-03-19 DIAGNOSIS — R1084 Generalized abdominal pain: Secondary | ICD-10-CM

## 2019-03-19 DIAGNOSIS — R112 Nausea with vomiting, unspecified: Secondary | ICD-10-CM | POA: Insufficient documentation

## 2019-03-19 DIAGNOSIS — R1013 Epigastric pain: Secondary | ICD-10-CM | POA: Diagnosis present

## 2019-03-19 DIAGNOSIS — K5900 Constipation, unspecified: Secondary | ICD-10-CM | POA: Diagnosis not present

## 2019-03-19 LAB — COMPREHENSIVE METABOLIC PANEL
ALT: 17 U/L (ref 0–44)
AST: 24 U/L (ref 15–41)
Albumin: 4.1 g/dL (ref 3.5–5.0)
Alkaline Phosphatase: 54 U/L (ref 38–126)
Anion gap: 12 (ref 5–15)
BUN: 9 mg/dL (ref 8–23)
CO2: 31 mmol/L (ref 22–32)
Calcium: 9.7 mg/dL (ref 8.9–10.3)
Chloride: 100 mmol/L (ref 98–111)
Creatinine, Ser: 0.68 mg/dL (ref 0.44–1.00)
GFR calc Af Amer: >60 mL/min (ref >60)
GFR calc non Af Amer: >60 mL/min (ref >60)
Glucose, Bld: 82 mg/dL (ref 70–99)
Potassium: 3.8 mmol/L (ref 3.5–5.1)
Sodium: 143 mmol/L (ref 135–145)
Total Bilirubin: 1.1 mg/dL (ref 0.3–1.2)
Total Protein: 6.4 g/dL — ABNORMAL LOW (ref 6.5–8.1)

## 2019-03-19 LAB — URINALYSIS, ROUTINE W REFLEX MICROSCOPIC
Bilirubin Urine: NEGATIVE
Glucose, UA: NEGATIVE mg/dL
Hgb urine dipstick: NEGATIVE
Ketones, ur: 20 mg/dL — AB
Leukocytes,Ua: NEGATIVE
Nitrite: NEGATIVE
Protein, ur: NEGATIVE mg/dL
Specific Gravity, Urine: 1.012 (ref 1.005–1.030)
pH: 7 (ref 5.0–8.0)

## 2019-03-19 LAB — TROPONIN I (HIGH SENSITIVITY): Troponin I (High Sensitivity): 8 ng/L (ref <18)

## 2019-03-19 LAB — CBC WITH DIFFERENTIAL/PLATELET
Abs Immature Granulocytes: 0.01 10*3/uL (ref 0.00–0.07)
Basophils Absolute: 0 10*3/uL (ref 0.0–0.1)
Basophils Relative: 1 %
Eosinophils Absolute: 0 10*3/uL (ref 0.0–0.5)
Eosinophils Relative: 1 %
HCT: 38.1 % (ref 36.0–46.0)
Hemoglobin: 12.1 g/dL (ref 12.0–15.0)
Immature Granulocytes: 0 %
Lymphocytes Relative: 31 %
Lymphs Abs: 1.1 10*3/uL (ref 0.7–4.0)
MCH: 29.3 pg (ref 26.0–34.0)
MCHC: 31.8 g/dL (ref 30.0–36.0)
MCV: 92.3 fL (ref 80.0–100.0)
Monocytes Absolute: 0.3 10*3/uL (ref 0.1–1.0)
Monocytes Relative: 8 %
Neutro Abs: 2.2 10*3/uL (ref 1.7–7.7)
Neutrophils Relative %: 59 %
Platelets: 301 10*3/uL (ref 150–400)
RBC: 4.13 MIL/uL (ref 3.87–5.11)
RDW: 13.6 % (ref 11.5–15.5)
WBC: 3.7 10*3/uL — ABNORMAL LOW (ref 4.0–10.5)
nRBC: 0 % (ref 0.0–0.2)

## 2019-03-19 MED ORDER — ALUM & MAG HYDROXIDE-SIMETH 200-200-20 MG/5ML PO SUSP
15.0000 mL | Freq: Once | ORAL | Status: AC
  Administered 2019-03-19: 15 mL via ORAL
  Filled 2019-03-19: qty 30

## 2019-03-19 MED ORDER — FAMOTIDINE 20 MG PO TABS: 20.0000 mg | ORAL_TABLET | Freq: Every day | ORAL | 0 refills | Status: DC

## 2019-03-19 MED ORDER — ONDANSETRON 4 MG PO TBDP
4.0000 mg | ORAL_TABLET | Freq: Once | ORAL | Status: AC
  Administered 2019-03-19: 4 mg via ORAL
  Filled 2019-03-19: qty 1

## 2019-03-19 MED ORDER — SENNOSIDES-DOCUSATE SODIUM 8.6-50 MG PO TABS: 1.0000 | ORAL_TABLET | Freq: Every evening | ORAL | 0 refills | Status: DC | PRN

## 2019-03-19 MED ORDER — ONDANSETRON 4 MG PO TBDP: 4.0000 mg | ORAL_TABLET | Freq: Three times a day (TID) | ORAL | 0 refills | Status: DC | PRN

## 2019-03-19 NOTE — ED Triage Notes (Signed)
Abd pain and vomiting x several days, seen here on the 1st, states had a small bm yesterday, but not much through the week.

## 2019-03-19 NOTE — Discharge Instructions (Signed)
You have been seen in the Emergency Department (ED) for abdominal pain.  Your evaluation did not identify a clear cause of your symptoms but was generally reassuring.  Please follow up as instructed above regarding todays emergent visit and the symptoms that are bothering you.  I have called in's medications to help with symptoms.  I have also provided the contact information for a gastroenterologist.  Call them on Monday to schedule the next available appointment.  Return to the ED if your abdominal pain worsens or fails to improve, you develop bloody vomiting, bloody diarrhea, you are unable to tolerate fluids due to vomiting, fever greater than 101, or other symptoms that concern you.

## 2019-03-19 NOTE — ED Provider Notes (Signed)
MSE was initiated and I personally evaluated the patient and placed orders (if any) at  6:28 AM on March 19, 2019.  The patient appears stable so that the remainder of the MSE may be completed by another provider.  Patient presents with over a week of abdominal pain that she states is in the epigastric area, down both sides of the lateral aspect of her abdomen and then over the suprapubic area.  She states it comes and goes and it mainly comes or gets worse after she eats.  She also reports 30 minutes after she eats she gets vomiting.  She states she has not had a normal bowel movement and yesterday did have a very small amount after she took some laxatives.  Patient was seen in the ED on November 1 and had a CT of the abdomen done then.  It was read as mild gastric wall thickening which is nonspecific but can be seen in patients with gastritis.  She denies having acid fluid in her throat.  She was seen after that by her PCP who thinks she may have a bowel or intestinal blockage.  She was sent back to the ED to be evaluated.  She came on the 11th but left without being seen.  She returns this morning.  Patient states her only abdominal surgery is a BTL.  Patient is awake and alert and in no apparent distress.  Pupils are equal reactive light extraocular muscles are intact, her tongue is mildly dry.  Cardiovascular S1-S2 is normal, she is in no respiratory distress and her lungs are clear.  On abdominal exam she has scattered bowel sounds.  There does not appear to be any distention.  She has no guarding or rebound.  She seems most tender in the epigastric area.  Abdominal 2 view was done since patient had recent CT scan.  Laboratory testing was done.  Rolland Porter, MD, Barbette Or, MD 03/19/19 [phone]

## 2019-03-19 NOTE — ED Provider Notes (Signed)
Emergency Department Provider Note   I have reviewed the triage vital signs and the nursing notes.   HISTORY  Chief Complaint Abdominal Pain   HPI Kathryn Stein is a 73 y.o. female with past medical history reviewed below including GERD presents to the emergency department with epigastric abdominal pain radiating down into the abdomen.  Patient denies any diarrhea.  She has had constipation with vomiting.  Pain is worse with eating and then the patient has a vomiting.  She was seen in the emergency department 6 days prior with CT scan at that time showing no acute findings or obstruction.  She was discharged home with Zofran which she has been taking but ran out of this prescription.  She states it was helping her to some degree.  She has been taking magnesium citrate for constipation symptoms with minimal relief.  Denies any rectal pain, fullness.  No blood in the emesis.  Denies fevers or chills.  Past Medical History:  Diagnosis Date  . Abnormal heart rate    after birth of 3rd child  . Anemia   . Anorexia   . Anxiety   . Arthritis   . Colon polyps   . Depression   . Dry cough    on and off  . Dyspepsia   . Gastritis    found on EGD  . GERD (gastroesophageal reflux disease)   . Hyperlipidemia   . Hypertension   . Marital relationship problem   . Osteoporosis   . Postmenopausal   . Vertigo   . Vertigo due to previous cerebellar infarction     Patient Active Problem List   Diagnosis Date Noted  . Underweight 11/01/2015  . Essential hypertension, benign 11/22/2013  . Vitamin D deficiency 11/04/2013  . HLD (hyperlipidemia) 07/29/2013  . Headache(784.0) 05/26/2013  . Anorexia symptom 01/18/2013  . Back pain 01/18/2013  . Depression 07/11/2012  . Osteoporosis 07/11/2012  . Personal history of colonic polyps 07/11/2012    Past Surgical History:  Procedure Laterality Date  . BREAST SURGERY Left    biopsy benign  . CESAREAN SECTION     per pt/ no  c-section/had 5 vaginal births  . COLONOSCOPY    . MOUTH SURGERY     bone extraction  . TUBAL LIGATION      Allergies Codeine and Darvocet [propoxyphene n-acetaminophen]  Family History  Problem Relation Age of Onset  . Colon cancer Mother   . Diabetes Mother   . Cancer Mother   . Heart disease Mother   . Hypertension Mother   . Stroke Mother   . Cancer Brother        throat - smoker  . Kidney disease Brother   . Prostate cancer Brother   . Heart disease Sister   . Cancer Sister        breast  . Diabetes Sister   . Liver disease Brother   . Diabetes Brother   . Hypertension Brother   . Drug abuse Brother   . Stroke Father 30  . Diabetes Sister   . Heart disease Sister   . Hypertension Sister   . Diabetes Sister   . Cancer Sister   . Cancer Brother        prostate  . Stomach cancer Other        niece  . Kidney cancer Other        niece  . Cancer Daughter        kidney  . Alcohol  abuse Brother   . Early death Sister   . Heart disease Sister   . Hypertension Sister   . Hypertension Sister   . Diabetes Sister   . Mental illness Daughter     Social History Social History   Tobacco Use  . Smoking status: Never Smoker  . Smokeless tobacco: Never Used  Substance Use Topics  . Alcohol use: No  . Drug use: No    Review of Systems  Constitutional: No fever/chills Eyes: No visual changes. ENT: No sore throat. Cardiovascular: Denies chest pain. Respiratory: Denies shortness of breath. Gastrointestinal: Positive epigastric abdominal pain. Positive nausea and vomiting.  No diarrhea. Positive constipation. Genitourinary: Negative for dysuria. Musculoskeletal: Negative for back pain. Skin: Negative for rash. Neurological: Negative for headaches, focal weakness or numbness.  10-point ROS otherwise negative.  ____________________________________________   PHYSICAL EXAM:  VITAL SIGNS: ED Triage Vitals [03/19/19 0615]  Enc Vitals Group     BP (!)  182/101     Pulse Rate 67     Resp 16     Temp 97.8 F (36.6 C)     Temp Source Oral     SpO2 99 %     Weight 132 lb (59.9 kg)     Height 5\' 2"  (1.575 m)   Constitutional: Alert and oriented. Well appearing and in no acute distress. Eyes: Conjunctivae are normal.  Head: Atraumatic. Nose: No congestion/rhinnorhea. Mouth/Throat: Mucous membranes are moist.  Neck: No stridor. Cardiovascular: Normal rate, regular rhythm. Good peripheral circulation. Grossly normal heart sounds.   Respiratory: Normal respiratory effort.  No retractions. Lungs CTAB. Gastrointestinal: Soft with mild diffuse tenderness. No focal tenderness, rebound, or guarding. No distention.  Musculoskeletal: No gross deformities of extremities. Neurologic:  Normal speech and language.  Skin:  Skin is warm, dry and intact. No rash noted.   ____________________________________________   LABS (all labs ordered are listed, but only abnormal results are displayed)  Labs Reviewed  COMPREHENSIVE METABOLIC PANEL - Abnormal; Notable for the following components:      Result Value   Total Protein 6.4 (*)    All other components within normal limits  CBC WITH DIFFERENTIAL/PLATELET - Abnormal; Notable for the following components:   WBC 3.7 (*)    All other components within normal limits  URINALYSIS, ROUTINE W REFLEX MICROSCOPIC - Abnormal; Notable for the following components:   APPearance CLOUDY (*)    Ketones, ur 20 (*)    All other components within normal limits  TROPONIN I (HIGH SENSITIVITY)   ____________________________________________  RADIOLOGY  Dg Abd 2 Views  Result Date: 03/19/2019 CLINICAL DATA:  Vomiting after eating since yesterday. EXAM: ABDOMEN - 2 VIEW COMPARISON:  None. FINDINGS: Bowel gas pattern is nonobstructive with mild fecal retention throughout the colon. No free peritoneal air. Mild degenerate change of the spine and hips. A few pelvic phleboliths are present. IMPRESSION: Nonobstructive  bowel gas pattern with mild fecal retention throughout the colon. Electronically Signed   By: Marin Olp M.D.   On: 03/19/2019 08:17    ____________________________________________   PROCEDURES  Procedure(s) performed:   Procedures  None  ____________________________________________   INITIAL IMPRESSION / ASSESSMENT AND PLAN / ED COURSE  Pertinent labs & imaging results that were available during my care of the patient were reviewed by me and considered in my medical decision making (see chart for details).   Patient presents to the emergency department with continued abdominal pain.  Pain seems to be primarily epigastric and radiating into the  lower abdomen.  Patient's abdominal exam is overall reassuring with fairly minimal tenderness on exam.  No distention.  No peritoneal findings.  Plain film ordered by the initial screening exam shows a nonobstructive bowel gas pattern with mild fecal retention.  Lab work interpreted and reassuring.  UA is pending.  CT imaging from 6 days ago reviewed with no acute findings.  Plan for Maalox, zofran, and PO challenge. No repeat CT imaging at this time. Very low suspicion clinically for atypical ACS. Mild tenderness on exam and no pain into the chest or SOB symptoms. No diaphoresis.    EKG Interpretation  Date/Time:  Saturday March 19 2019 10:28:35 EST Ventricular Rate:  52 PR Interval:    QRS Duration: 82 QT Interval:  430 QTC Calculation: 400 R Axis:   78 Text Interpretation: Sinus rhythm Anteroseptal infarct, old No STEMI Confirmed by Nanda Quinton [phone]) on 03/19/2019 10:31:32 AM      10:26 AM  Patient's lab work including addition of troponin reviewed with no clear findings.  UA unremarkable.  Patient feeling improved here after GI cocktail and Zofran.  Will extend this prescription, begin famotidine, give Peri-Colace as constipation management.  Patient to call her PCP on Monday.  Also provided contact information for  gastroenterology who she will also call on Monday for the next available appointment. ____________________________________________  FINAL CLINICAL IMPRESSION(S) / ED DIAGNOSES  Final diagnoses:  Generalized abdominal pain  Non-intractable vomiting with nausea, unspecified vomiting type  Constipation, unspecified constipation type     MEDICATIONS GIVEN DURING THIS VISIT:  Medications  alum & mag hydroxide-simeth (MAALOX/MYLANTA) 200-200-20 MG/5ML suspension 15 mL (15 mLs Oral Given 03/19/19 0928)  ondansetron (ZOFRAN-ODT) disintegrating tablet 4 mg (4 mg Oral Given 03/19/19 0928)     NEW OUTPATIENT MEDICATIONS STARTED DURING THIS VISIT:  New Prescriptions   FAMOTIDINE (PEPCID) 20 MG TABLET    Take 1 tablet (20 mg total) by mouth daily.   ONDANSETRON (ZOFRAN ODT) 4 MG DISINTEGRATING TABLET    Take 1 tablet (4 mg total) by mouth every 8 (eight) hours as needed.   SENNA-DOCUSATE (SENOKOT-S) 8.6-50 MG TABLET    Take 1 tablet by mouth at bedtime as needed for mild constipation or moderate constipation.    Note:  This document was prepared using Dragon voice recognition software and may include unintentional dictation errors.  Nanda Quinton, MD, [facility]    Long, W[facility], MD 03/19/19 [phone]

## 2019-03-19 NOTE — ED Notes (Signed)
Pt given water to drink for fluid challenge. Pt tolerating without difficulties at this time.

## 2019-03-23 ENCOUNTER — Telehealth: Payer: Self-pay | Admitting: Family

## 2019-03-23 NOTE — Telephone Encounter (Signed)
Attempted to contact patient - NVM 

## 2019-03-23 NOTE — Telephone Encounter (Signed)
Have her do over-the-counter MiraLAX and drink plenty of water and do it twice a day over the next 3 days to get things moving.  I do agree that the x-ray shows no blockage so this should get her flowing.  If she really gets desperate wants to do something more she can also buy an over-the-counter enema and that should help as well.

## 2019-03-24 NOTE — Telephone Encounter (Signed)
Aware of provider's advice. 

## 2019-05-20 ENCOUNTER — Other Ambulatory Visit: Payer: Self-pay | Admitting: Family

## 2019-05-20 DIAGNOSIS — I10 Essential (primary) hypertension: Secondary | ICD-10-CM

## 2019-05-31 ENCOUNTER — Telehealth: Payer: Self-pay

## 2019-05-31 ENCOUNTER — Other Ambulatory Visit: Payer: Self-pay

## 2019-05-31 ENCOUNTER — Ambulatory Visit: Payer: Medicare Other | Attending: Internal Medicine

## 2019-05-31 DIAGNOSIS — Z20822 Contact with and (suspected) exposure to covid-19: Secondary | ICD-10-CM

## 2019-05-31 NOTE — Telephone Encounter (Signed)
Daughter called stating that her mother had a COVID-36 test today.  She called with new home number of [phone].  Number was changed in contact .

## 2019-06-01 ENCOUNTER — Telehealth: Payer: Self-pay

## 2019-06-01 LAB — NOVEL CORONAVIRUS, NAA: SARS-CoV-2, NAA: DETECTED — AB

## 2019-06-01 NOTE — Telephone Encounter (Signed)
Checking on COVID 19 results, not available yet.

## 2019-06-02 ENCOUNTER — Telehealth: Payer: Self-pay | Admitting: Physician Assistant

## 2019-06-02 ENCOUNTER — Ambulatory Visit (INDEPENDENT_AMBULATORY_CARE_PROVIDER_SITE_OTHER): Payer: Medicare Other | Admitting: Family

## 2019-06-02 ENCOUNTER — Other Ambulatory Visit: Payer: Self-pay

## 2019-06-02 ENCOUNTER — Other Ambulatory Visit: Payer: Self-pay | Admitting: Physician Assistant

## 2019-06-02 ENCOUNTER — Encounter: Payer: Self-pay | Admitting: Family

## 2019-06-02 DIAGNOSIS — U071 COVID-19: Secondary | ICD-10-CM | POA: Diagnosis not present

## 2019-06-02 DIAGNOSIS — Z7189 Other specified counseling: Secondary | ICD-10-CM | POA: Diagnosis not present

## 2019-06-02 DIAGNOSIS — I10 Essential (primary) hypertension: Secondary | ICD-10-CM

## 2019-06-02 DIAGNOSIS — R54 Age-related physical debility: Secondary | ICD-10-CM

## 2019-06-02 MED ORDER — BENZONATATE 200 MG PO CAPS: 200.0000 mg | ORAL_CAPSULE | Freq: Three times a day (TID) | ORAL | 1 refills | Status: DC | PRN

## 2019-06-02 MED ORDER — ALBUTEROL SULFATE HFA 108 (90 BASE) MCG/ACT IN AERS: 2.0000 | INHALATION_SPRAY | Freq: Four times a day (QID) | RESPIRATORY_TRACT | 1 refills | Status: DC | PRN

## 2019-06-02 NOTE — Telephone Encounter (Signed)
  I connected by phone with Kathryn Stein on 06/02/2019 at 6:10 PM to discuss the potential use of an new treatment for mild to moderate COVID-19 viral infection in non-hospitalized patients.  This patient is a 74 y.o. female that meets the FDA criteria for Emergency Use Authorization of bamlanivimab or casirivimab\imdevimab.  Has a (+) direct SARS-CoV-2 viral test result  Has mild or moderate COVID-19   Is ? 74 years of age and weighs ? 40 kg  Is NOT hospitalized due to COVID-19  Is NOT requiring oxygen therapy or requiring an increase in baseline oxygen flow rate due to COVID-19  Is within 10 days of symptom onset  Has at least one of the high risk factor(s) for progression to severe COVID-19 and/or hospitalization as defined in EUA.  Specific high risk criteria : >/= 74 yo   I have spoken and communicated the following to the patient or parent/caregiver:  1. FDA has authorized the emergency use of bamlanivimab and casirivimab\imdevimab for the treatment of mild to moderate COVID-19 in adults and pediatric patients with positive results of direct SARS-CoV-2 viral testing who are 66 years of age and older weighing at least 40 kg, and who are at high risk for progressing to severe COVID-19 and/or hospitalization.  2. The significant known and potential risks and benefits of bamlanivimab and casirivimab\imdevimab, and the extent to which such potential risks and benefits are unknown.  3. Information on available alternative treatments and the risks and benefits of those alternatives, including clinical trials.  4. Patients treated with bamlanivimab and casirivimab\imdevimab should continue to self-isolate and use infection control measures (e.g., wear mask, isolate, social distance, avoid sharing personal items, clean and disinfect "high touch" surfaces, and frequent handwashing) according to CDC guidelines.   5. The patient or parent/caregiver has the option to accept or refuse  bamlanivimab or casirivimab\imdevimab .  After reviewing this information with the patient, The patient agreed to proceed with receiving the bamlanimivab infusion and will be provided a copy of the Fact sheet prior to receiving the infusion.   She is signed up for Monday 06/06/19 @ 10:30am.    Angelena Form 06/02/2019 6:10 PM

## 2019-06-02 NOTE — Telephone Encounter (Signed)
Called to discuss with patient about Covid symptoms and the use of bamlanivimab, a monoclonal antibody infusion for those with mild to moderate Covid symptoms and at a high risk of hospitalization.  Pt is qualified for this infusion at the [facility] infusion center due to Age > 26.    The patient would like to think about it. Her symptoms started on 05/30/19. She will call me back tomorrow to let me know if she would like to be signed up.    Angelena Form PA-C  [facility]

## 2019-06-02 NOTE — Progress Notes (Signed)
Virtual Visit via telephone Note Due to COVID-19 pandemic this visit was conducted virtually. This visit type was conducted due to national recommendations for restrictions regarding the COVID-19 Pandemic (e.g. social distancing, sheltering in place) in an effort to limit this patient's exposure and mitigate transmission in our community. All issues noted in this document were discussed and addressed.  A physical exam was not performed with this format.  I connected with Kathryn Stein on 06/02/19 at 10:25 AM by telephone and verified that I am speaking with the correct person using two identifiers. Kathryn Stein is currently located at home and no one is currently with her during visit. The provider, Evelina Dun, FNP is located in their office at time of visit.  I discussed the limitations, risks, security and privacy concerns of performing an evaluation and management service by telephone and the availability of in person appointments. I also discussed with the patient that there may be a patient responsible charge related to this service. The patient expressed understanding and agreed to proceed.   History and Present Illness:  Pt calls the office today with COVID. She states her husband is positive and currently admitted in the hospital. She states her headaches started three days ago.  Cough This is a new problem. The current episode started in the past 7 days. The problem has been unchanged. The cough is non-productive. Associated symptoms include headaches. Pertinent negatives include no chills, ear congestion, ear pain, fever, myalgias, sore throat, shortness of breath or wheezing. She has tried rest for the symptoms. The treatment provided mild relief.      Review of Systems  Constitutional: Negative for chills and fever.  HENT: Negative for ear pain and sore throat.   Respiratory: Positive for cough. Negative for shortness of breath and wheezing.   Musculoskeletal: Negative for  myalgias.  Neurological: Positive for headaches.  All other systems reviewed and are negative.    Observations/Objective: No SOB or distress noted   Assessment and Plan: 1. COVID-19 virus detected Rest Tylenol as needed Stable at this time, discussed red flags to call office Self isolated Call office if symptoms worsen or do not improve  - benzonatate (TESSALON) 200 MG capsule; Take 1 capsule (200 mg total) by mouth 3 (three) times daily as needed.  Dispense: 30 capsule; Refill: 1 - albuterol (VENTOLIN HFA) 108 (90 Base) MCG/ACT inhaler; Inhale 2 puffs into the lungs every 6 (six) hours as needed for wheezing or shortness of breath.  Dispense: 18 g; Refill: 1 - MyChart COVID-19 home monitoring program; Future  2. Educated about COVID-19 virus infection       I discussed the assessment and treatment plan with the patient. The patient was provided an opportunity to ask questions and all were answered. The patient agreed with the plan and demonstrated an understanding of the instructions.   The patient was advised to call back or seek an in-person evaluation if the symptoms worsen or if the condition fails to improve as anticipated.  The above assessment and management plan was discussed with the patient. The patient verbalized understanding of and has agreed to the management plan. Patient is aware to call the clinic if symptoms persist or worsen. Patient is aware when to return to the clinic for a follow-up visit. Patient educated on when it is appropriate to go to the emergency department.   Time call ended:  10:41 Am  I provided 16 minutes of non-face-to-face time during this encounter.  Evelina Dun, FNP

## 2019-06-06 ENCOUNTER — Ambulatory Visit (HOSPITAL_COMMUNITY)
Admission: RE | Admit: 2019-06-06 | Discharge: 2019-06-06 | Disposition: A | Discharge status: Home / Self Care | Payer: Medicare Other | Source: Ambulatory Visit | Attending: Pulmonary Disease | Admitting: Pulmonary Disease

## 2019-06-06 DIAGNOSIS — I10 Essential (primary) hypertension: Secondary | ICD-10-CM | POA: Insufficient documentation

## 2019-06-06 DIAGNOSIS — U071 COVID-19: Secondary | ICD-10-CM | POA: Insufficient documentation

## 2019-06-06 DIAGNOSIS — R54 Age-related physical debility: Secondary | ICD-10-CM | POA: Diagnosis present

## 2019-06-06 DIAGNOSIS — Z23 Encounter for immunization: Secondary | ICD-10-CM | POA: Insufficient documentation

## 2019-06-06 MED ORDER — FAMOTIDINE IN NACL 20-0.9 MG/50ML-% IV SOLN: 20.0000 mg | Freq: Once | INTRAVENOUS | Status: DC | PRN

## 2019-06-06 MED ORDER — DIPHENHYDRAMINE HCL 50 MG/ML IJ SOLN: 50.0000 mg | Freq: Once | INTRAMUSCULAR | Status: DC | PRN

## 2019-06-06 MED ORDER — SODIUM CHLORIDE 0.9 % IV SOLN
700.0000 mg | Freq: Once | INTRAVENOUS | Status: AC
  Administered 2019-06-06: 700 mg via INTRAVENOUS
  Filled 2019-06-06: qty 20

## 2019-06-06 MED ORDER — ALBUTEROL SULFATE HFA 108 (90 BASE) MCG/ACT IN AERS: 2.0000 | INHALATION_SPRAY | Freq: Once | RESPIRATORY_TRACT | Status: DC | PRN

## 2019-06-06 MED ORDER — METHYLPREDNISOLONE SODIUM SUCC 125 MG IJ SOLR: 125.0000 mg | Freq: Once | INTRAMUSCULAR | Status: DC | PRN

## 2019-06-06 MED ORDER — SODIUM CHLORIDE 0.9 % IV SOLN
INTRAVENOUS | Status: DC | PRN
  Administered 2019-06-06: 250 mL via INTRAVENOUS

## 2019-06-06 MED ORDER — EPINEPHRINE 0.3 MG/0.3ML IJ SOAJ: 0.3000 mg | Freq: Once | INTRAMUSCULAR | Status: DC | PRN

## 2019-06-06 NOTE — Discharge Instructions (Signed)
COVID-19 COVID-19 is a respiratory infection that is caused by a virus called severe acute respiratory syndrome coronavirus 2 (SARS-CoV-2). The disease is also known as coronavirus disease or novel coronavirus. In some people, the virus may not cause any symptoms. In others, it may cause a serious infection. The infection can get worse quickly and can lead to complications, such as:  Pneumonia, or infection of the lungs.  Acute respiratory distress syndrome or ARDS. This is a condition in which fluid build-up in the lungs prevents the lungs from filling with air and passing oxygen into the blood.  Acute respiratory failure. This is a condition in which there is not enough oxygen passing from the lungs to the body or when carbon dioxide is not passing from the lungs out of the body.  Sepsis or septic shock. This is a serious bodily reaction to an infection.  Blood clotting problems.  Secondary infections due to bacteria or fungus.  Organ failure. This is when your body's organs stop working. The virus that causes COVID-19 is contagious. This means that it can spread from person to person through droplets from coughs and sneezes (respiratory secretions). What are the causes? This illness is caused by a virus. You may catch the virus by:  Breathing in droplets from an infected person. Droplets can be spread by a person breathing, speaking, singing, coughing, or sneezing.  Touching something, like a table or a doorknob, that was exposed to the virus (contaminated) and then touching your mouth, nose, or eyes. What increases the risk? Risk for infection You are more likely to be infected with this virus if you:  Are within 6 feet (2 meters) of a person with COVID-19.  Provide care for or live with a person who is infected with COVID-19.  Spend time in crowded indoor spaces or live in shared housing. Risk for serious illness You are more likely to become seriously ill from the virus if  you:  Are 50 years of age or older. The higher your age, the more you are at risk for serious illness.  Live in a nursing home or long-term care facility.  Have cancer.  Have a long-term (chronic) disease such as: ? Chronic lung disease, including chronic obstructive pulmonary disease or asthma. ? A long-term disease that lowers your body's ability to fight infection (immunocompromised). ? Heart disease, including heart failure, a condition in which the arteries that lead to the heart become narrow or blocked (coronary artery disease), a disease which makes the heart muscle thick, weak, or stiff (cardiomyopathy). ? Diabetes. ? Chronic kidney disease. ? Sickle cell disease, a condition in which red blood cells have an abnormal "sickle" shape. ? Liver disease.  Are obese. What are the signs or symptoms? Symptoms of this condition can range from mild to severe. Symptoms may appear any time from 2 to 14 days after being exposed to the virus. They include:  A fever or chills.  A cough.  Difficulty breathing.  Headaches, body aches, or muscle aches.  Runny or stuffy (congested) nose.  A sore throat.  New loss of taste or smell. Some people may also have stomach problems, such as nausea, vomiting, or diarrhea. Other people may not have any symptoms of COVID-19. How is this diagnosed? This condition may be diagnosed based on:  Your signs and symptoms, especially if: ? You live in an area with a COVID-19 outbreak. ? You recently traveled to or from an area where the virus is common. ? You   provide care for or live with a person who was diagnosed with COVID-19. ? You were exposed to a person who was diagnosed with COVID-19.  A physical exam.  Lab tests, which may include: ? Taking a sample of fluid from the back of your nose and throat (nasopharyngeal fluid), your nose, or your throat using a swab. ? A sample of mucus from your lungs (sputum). ? Blood tests.  Imaging tests,  which may include, X-rays, CT scan, or ultrasound. How is this treated? At present, there is no medicine to treat COVID-19. Medicines that treat other diseases are being used on a trial basis to see if they are effective against COVID-19. Your health care provider will talk with you about ways to treat your symptoms. For most people, the infection is mild and can be managed at home with rest, fluids, and over-the-counter medicines. Treatment for a serious infection usually takes places in a hospital intensive care unit (ICU). It may include one or more of the following treatments. These treatments are given until your symptoms improve.  Receiving fluids and medicines through an IV.  Supplemental oxygen. Extra oxygen is given through a tube in the nose, a face mask, or a hood.  Positioning you to lie on your stomach (prone position). This makes it easier for oxygen to get into the lungs.  Continuous positive airway pressure (CPAP) or bi-level positive airway pressure (BPAP) machine. This treatment uses mild air pressure to keep the airways open. A tube that is connected to a motor delivers oxygen to the body.  Ventilator. This treatment moves air into and out of the lungs by using a tube that is placed in your windpipe.  Tracheostomy. This is a procedure to create a hole in the neck so that a breathing tube can be inserted.  Extracorporeal membrane oxygenation (ECMO). This procedure gives the lungs a chance to recover by taking over the functions of the heart and lungs. It supplies oxygen to the body and removes carbon dioxide. Follow these instructions at home: Lifestyle  If you are sick, stay home except to get medical care. Your health care provider will tell you how long to stay home. Call your health care provider before you go for medical care.  Rest at home as told by your health care provider.  Do not use any products that contain nicotine or tobacco, such as cigarettes,  e-cigarettes, and chewing tobacco. If you need help quitting, ask your health care provider.  Return to your normal activities as told by your health care provider. Ask your health care provider what activities are safe for you. General instructions  Take over-the-counter and prescription medicines only as told by your health care provider.  Drink enough fluid to keep your urine pale yellow.  Keep all follow-up visits as told by your health care provider. This is important. How is this prevented?  There is no vaccine to help prevent COVID-19 infection. However, there are steps you can take to protect yourself and others from this virus. To protect yourself:   Do not travel to areas where COVID-19 is a risk. The areas where COVID-19 is reported change often. To identify high-risk areas and travel restrictions, check the CDC travel website: wwwnc.cdc.gov/travel/notices  If you live in, or must travel to, an area where COVID-19 is a risk, take precautions to avoid infection. ? Stay away from people who are sick. ? Wash your hands often with soap and water for 20 seconds. If soap and water   are not available, use an alcohol-based hand sanitizer. ? Avoid touching your mouth, face, eyes, or nose. ? Avoid going out in public, follow guidance from your state and local health authorities. ? If you must go out in public, wear a cloth face covering or face mask. Make sure your mask covers your nose and mouth. ? Avoid crowded indoor spaces. Stay at least 6 feet (2 meters) away from others. ? Disinfect objects and surfaces that are frequently touched every day. This may include:  Counters and tables.  Doorknobs and light switches.  Sinks and faucets.  Electronics, such as phones, remote controls, keyboards, computers, and tablets. To protect others: If you have symptoms of COVID-19, take steps to prevent the virus from spreading to others.  If you think you have a COVID-19 infection, contact  your health care provider right away. Tell your health care team that you think you may have a COVID-19 infection.  Stay home. Leave your house only to seek medical care. Do not use public transport.  Do not travel while you are sick.  Wash your hands often with soap and water for 20 seconds. If soap and water are not available, use alcohol-based hand sanitizer.  Stay away from other members of your household. Let healthy household members care for children and pets, if possible. If you have to care for children or pets, wash your hands often and wear a mask. If possible, stay in your own room, separate from others. Use a different bathroom.  Make sure that all people in your household wash their hands well and often.  Cough or sneeze into a tissue or your sleeve or elbow. Do not cough or sneeze into your hand or into the air.  Wear a cloth face covering or face mask. Make sure your mask covers your nose and mouth. Where to find more information  Centers for Disease Control and Prevention: www.cdc.gov/coronavirus/2019-ncov/index.html  World Health Organization: www.who.int/health-topics/coronavirus Contact a health care provider if:  You live in or have traveled to an area where COVID-19 is a risk and you have symptoms of the infection.  You have had contact with someone who has COVID-19 and you have symptoms of the infection. Get help right away if:  You have trouble breathing.  You have pain or pressure in your chest.  You have confusion.  You have bluish lips and fingernails.  You have difficulty waking from sleep.  You have symptoms that get worse. These symptoms may represent a serious problem that is an emergency. Do not wait to see if the symptoms will go away. Get medical help right away. Call your local emergency services (911 in the [place] Do not drive yourself to the hospital. Let the emergency medical personnel know if you think you have  COVID-19. Summary  COVID-19 is a respiratory infection that is caused by a virus. It is also known as coronavirus disease or novel coronavirus. It can cause serious infections, such as pneumonia, acute respiratory distress syndrome, acute respiratory failure, or sepsis.  The virus that causes COVID-19 is contagious. This means that it can spread from person to person through droplets from breathing, speaking, singing, coughing, or sneezing.  You are more likely to develop a serious illness if you are 50 years of age or older, have a weak immune system, live in a nursing home, or have chronic disease.  There is no medicine to treat COVID-19. Your health care provider will talk with you about ways to treat your symptoms.    Take steps to protect yourself and others from infection. Wash your hands often and disinfect objects and surfaces that are frequently touched every day. Stay away from people who are sick and wear a mask if you are sick. This information is not intended to replace advice given to you by your health care provider. Make sure you discuss any questions you have with your health care provider. Document Revised: 02/25/2019 Document Reviewed: 06/03/2018 Elsevier Patient Education  2020 Elsevier Inc. What types of side effects do monoclonal antibody drugs cause?  Common side effects  In general, the more common side effects caused by monoclonal antibody drugs include: . Allergic reactions, such as hives or itching . Flu-like signs and symptoms, including chills, fatigue, fever, and muscle aches and pains . Nausea, vomiting . Diarrhea . Skin rashes . Low blood pressure   The CDC is recommending patients who receive monoclonal antibody treatments wait at least 90 days before being vaccinated.  Currently, there are no data on the safety and efficacy of mRNA COVID-19 vaccines in persons who received monoclonal antibodies or convalescent plasma as part of COVID-19 treatment. Based  on the estimated half-life of such therapies as well as evidence suggesting that reinfection is uncommon in the 90 days after initial infection, vaccination should be deferred for at least 90 days, as a precautionary measure until additional information becomes available, to avoid interference of the antibody treatment with vaccine-induced immune responses. 

## 2019-06-06 NOTE — Progress Notes (Signed)
  Diagnosis: COVID-19  Dr Joya Gaskins  Procedure: Covid Infusion Clinic Med: bamlanivimab infusion - Provided patient with bamlanimivab fact sheet for patients, parents and caregivers prior to infusion.  Complications: No immediate complications noted.  Discharge: Discharged home   Wilson 06/06/2019

## 2019-07-23 DIAGNOSIS — R140 Abdominal distension (gaseous): Secondary | ICD-10-CM | POA: Diagnosis not present

## 2019-07-23 DIAGNOSIS — K219 Gastro-esophageal reflux disease without esophagitis: Secondary | ICD-10-CM | POA: Diagnosis not present

## 2019-07-23 DIAGNOSIS — Z780 Asymptomatic menopausal state: Secondary | ICD-10-CM | POA: Diagnosis not present

## 2019-07-23 DIAGNOSIS — K567 Ileus, unspecified: Secondary | ICD-10-CM | POA: Diagnosis not present

## 2019-07-23 DIAGNOSIS — Z5331 Laparoscopic surgical procedure converted to open procedure: Secondary | ICD-10-CM | POA: Diagnosis not present

## 2019-07-23 DIAGNOSIS — Z8616 Personal history of COVID-19: Secondary | ICD-10-CM | POA: Diagnosis not present

## 2019-07-23 DIAGNOSIS — K56609 Unspecified intestinal obstruction, unspecified as to partial versus complete obstruction: Secondary | ICD-10-CM | POA: Diagnosis not present

## 2019-07-23 DIAGNOSIS — K3532 Acute appendicitis with perforation and localized peritonitis, without abscess: Secondary | ICD-10-CM | POA: Diagnosis not present

## 2019-07-23 DIAGNOSIS — Z20822 Contact with and (suspected) exposure to covid-19: Secondary | ICD-10-CM | POA: Diagnosis not present

## 2019-07-23 DIAGNOSIS — L853 Xerosis cutis: Secondary | ICD-10-CM | POA: Diagnosis not present

## 2019-07-23 DIAGNOSIS — R109 Unspecified abdominal pain: Secondary | ICD-10-CM | POA: Diagnosis not present

## 2019-07-23 DIAGNOSIS — K3533 Acute appendicitis with perforation and localized peritonitis, with abscess: Secondary | ICD-10-CM | POA: Diagnosis not present

## 2019-07-23 DIAGNOSIS — I10 Essential (primary) hypertension: Secondary | ICD-10-CM | POA: Diagnosis not present

## 2019-07-23 DIAGNOSIS — Z7982 Long term (current) use of aspirin: Secondary | ICD-10-CM | POA: Diagnosis not present

## 2019-07-23 DIAGNOSIS — K3580 Unspecified acute appendicitis: Secondary | ICD-10-CM | POA: Diagnosis not present

## 2019-08-10 DIAGNOSIS — K3532 Acute appendicitis with perforation and localized peritonitis, without abscess: Secondary | ICD-10-CM | POA: Diagnosis not present

## 2019-08-15 DIAGNOSIS — L03311 Cellulitis of abdominal wall: Secondary | ICD-10-CM | POA: Diagnosis not present

## 2019-08-15 DIAGNOSIS — R198 Other specified symptoms and signs involving the digestive system and abdomen: Secondary | ICD-10-CM | POA: Diagnosis not present

## 2019-08-15 DIAGNOSIS — Z6820 Body mass index (BMI) 20.0-20.9, adult: Secondary | ICD-10-CM | POA: Diagnosis not present

## 2019-08-17 DIAGNOSIS — K561 Intussusception: Secondary | ICD-10-CM | POA: Diagnosis not present

## 2019-08-17 DIAGNOSIS — R9389 Abnormal findings on diagnostic imaging of other specified body structures: Secondary | ICD-10-CM | POA: Diagnosis not present

## 2019-08-17 DIAGNOSIS — L03311 Cellulitis of abdominal wall: Secondary | ICD-10-CM | POA: Diagnosis not present

## 2019-08-17 DIAGNOSIS — I700 Atherosclerosis of aorta: Secondary | ICD-10-CM | POA: Diagnosis not present

## 2019-08-17 DIAGNOSIS — Z9889 Other specified postprocedural states: Secondary | ICD-10-CM | POA: Diagnosis not present

## 2019-08-18 DIAGNOSIS — T8149XA Infection following a procedure, other surgical site, initial encounter: Secondary | ICD-10-CM | POA: Diagnosis not present

## 2019-08-31 DIAGNOSIS — K3532 Acute appendicitis with perforation and localized peritonitis, without abscess: Secondary | ICD-10-CM | POA: Diagnosis not present

## 2019-09-01 ENCOUNTER — Ambulatory Visit: Payer: Medicare HMO | Attending: Internal Medicine

## 2019-09-01 DIAGNOSIS — Z23 Encounter for immunization: Secondary | ICD-10-CM

## 2019-09-01 NOTE — Progress Notes (Signed)
   Covid-19 Vaccination Clinic  Name:  Kathryn Stein    MRN: [id number] DOB: Dec 18, 1945  09/01/2019  [name] was observed post Covid-19 immunization for 15 minutes without incident. She was provided with Vaccine Information Sheet and instruction to access the V-Safe system.   [name] was instructed to call 911 with any severe reactions post vaccine: Marland Kitchen Difficulty breathing  . Swelling of face and throat  . A fast heartbeat  . A bad rash all over body  . Dizziness and weakness   Immunizations Administered    Name Date Dose VIS Date Route   Moderna COVID-19 Vaccine 09/01/2019  8:09 AM 0.5 mL 04/2019 Intramuscular   Manufacturer: Moderna   Lot: [id number]   [id number]

## 2019-10-04 ENCOUNTER — Ambulatory Visit: Payer: Medicare HMO | Attending: Internal Medicine

## 2019-10-04 ENCOUNTER — Ambulatory Visit: Payer: Medicare HMO

## 2019-10-04 ENCOUNTER — Other Ambulatory Visit: Payer: Self-pay

## 2019-10-04 DIAGNOSIS — Z23 Encounter for immunization: Secondary | ICD-10-CM

## 2019-10-04 NOTE — Progress Notes (Signed)
   Covid-19 Vaccination Clinic  Name:  Kathryn Stein    MRN: [id number] DOB: [birth year]  10/04/2019  Kathryn Stein was observed post Covid-19 immunization for 15 minutes without incident. She was provided with Vaccine Information Sheet and instruction to access the V-Safe system.   Kathryn Stein was instructed to call 911 with any severe reactions post vaccine: Marland Kitchen Difficulty breathing  . Swelling of face and throat  . A fast heartbeat  . A bad rash all over body  . Dizziness and weakness   Immunizations Administered    Name Date Dose VIS Date Route   Moderna COVID-19 Vaccine 10/04/2019  9:12 AM 0.5 mL 04/2019 Intramuscular   Manufacturer: Moderna   Lot: [id number]   [id number]

## 2019-11-08 DIAGNOSIS — E559 Vitamin D deficiency, unspecified: Secondary | ICD-10-CM | POA: Diagnosis not present

## 2019-11-08 DIAGNOSIS — I10 Essential (primary) hypertension: Secondary | ICD-10-CM | POA: Diagnosis not present

## 2020-05-09 DIAGNOSIS — I10 Essential (primary) hypertension: Secondary | ICD-10-CM | POA: Diagnosis not present

## 2020-05-09 DIAGNOSIS — E559 Vitamin D deficiency, unspecified: Secondary | ICD-10-CM | POA: Diagnosis not present

## 2020-05-09 DIAGNOSIS — M5441 Lumbago with sciatica, right side: Secondary | ICD-10-CM | POA: Diagnosis not present

## 2020-05-09 DIAGNOSIS — E785 Hyperlipidemia, unspecified: Secondary | ICD-10-CM | POA: Diagnosis not present

## 2020-06-19 DIAGNOSIS — F331 Major depressive disorder, recurrent, moderate: Secondary | ICD-10-CM | POA: Diagnosis not present

## 2020-07-02 DIAGNOSIS — F331 Major depressive disorder, recurrent, moderate: Secondary | ICD-10-CM | POA: Diagnosis not present

## 2020-07-05 DIAGNOSIS — F329 Major depressive disorder, single episode, unspecified: Secondary | ICD-10-CM | POA: Diagnosis not present

## 2021-01-15 IMAGING — CT CT ABD-PELV W/ CM
2 of 4 series · 16 of 46 positions shown, 18 images · IV contrast (Omnipaque or Isovue)
Comparison: June 06, 2017.

CLINICAL DATA: Acute abdominal pain. Nausea and vomiting. Bilateral
sharp intermittent abdominal pain.

EXAM:
CT ABDOMEN AND PELVIS WITH CONTRAST
TECHNIQUE: Multidetector CT imaging of the abdomen and pelvis was performed
using the standard protocol following bolus administration of
intravenous contrast.
CONTRAST:  100mL OMNIPAQUE IOHEXOL 300 MG/ML  SOLN

[Series 2: axial st · axial · 0.62mm/px · z∈[+1050,+1440]mm · 13 of 86 slices shown, 15 images]
[im 4/86  soft-tissue]
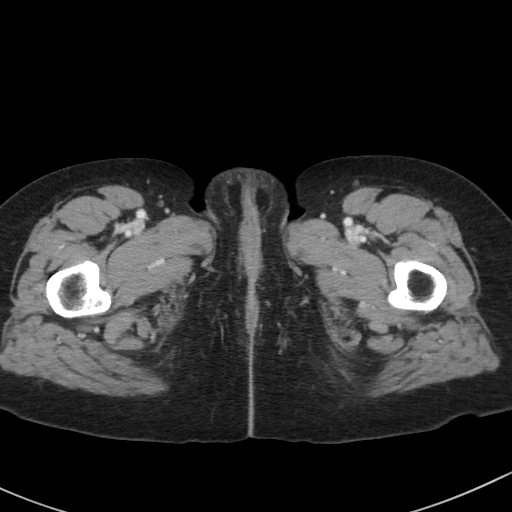
[im 4/86  bone]
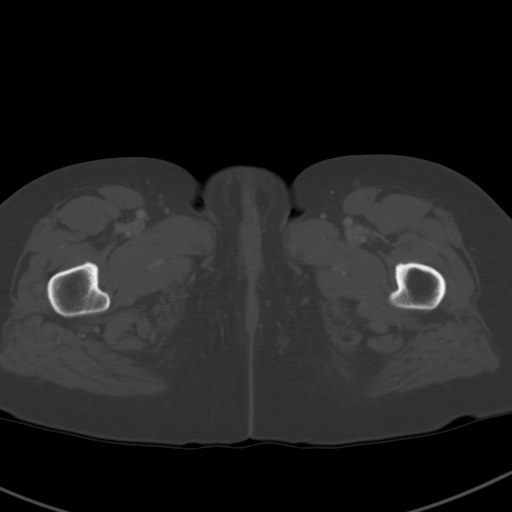
[im 12/86  soft-tissue]
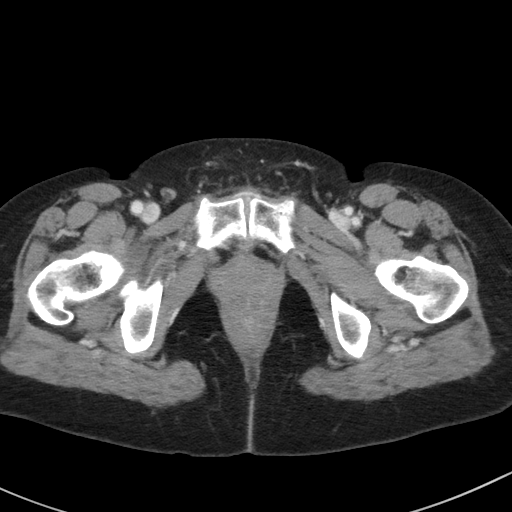
[im 20/86  soft-tissue]
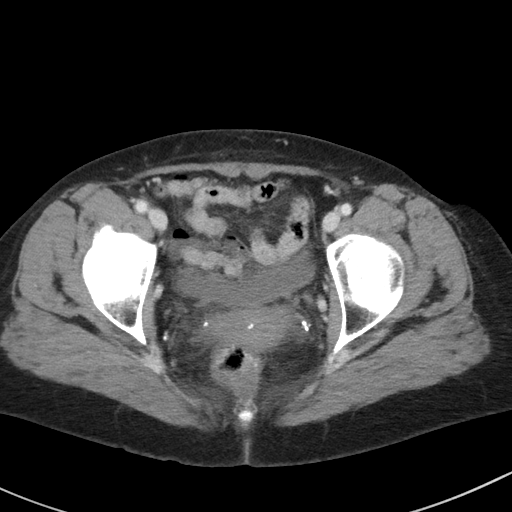
[im 24/86  soft-tissue]
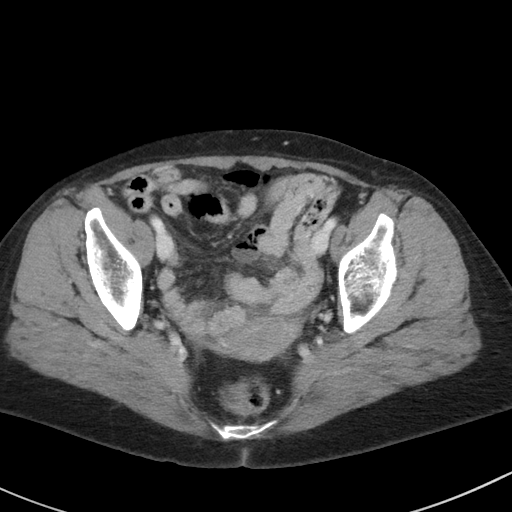
[im 31/86  soft-tissue]
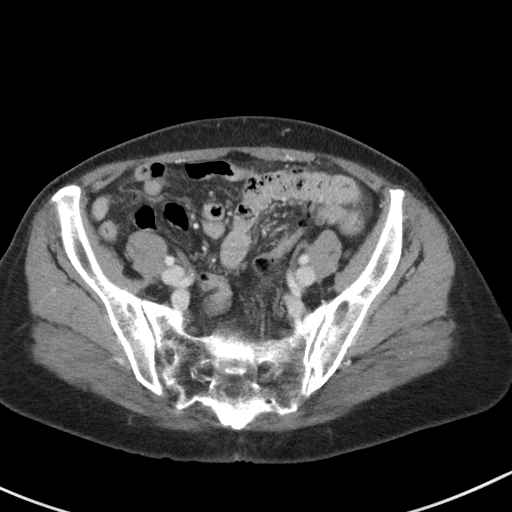
[im 35/86  soft-tissue]
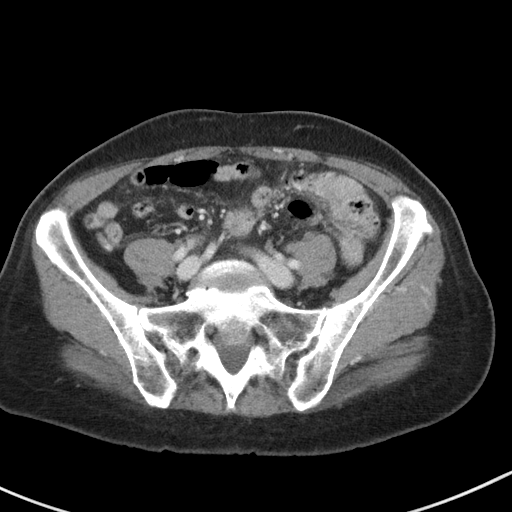
[im 43/86  soft-tissue]
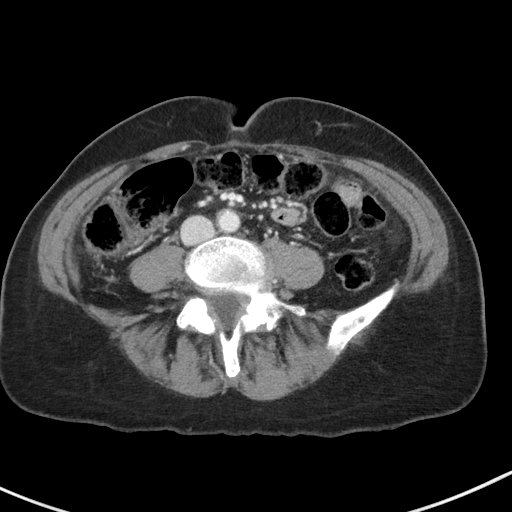
[im 51/86  soft-tissue]
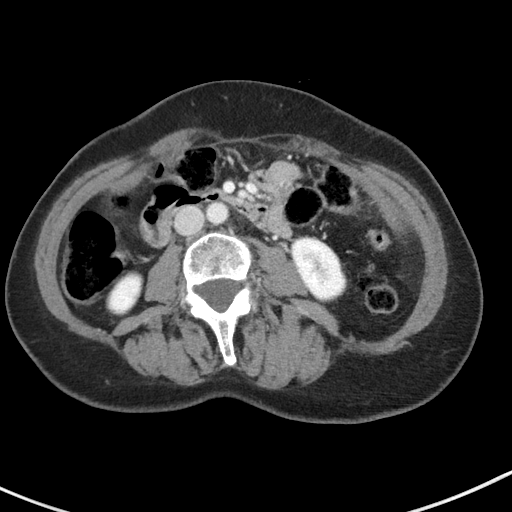
[im 55/86  soft-tissue]
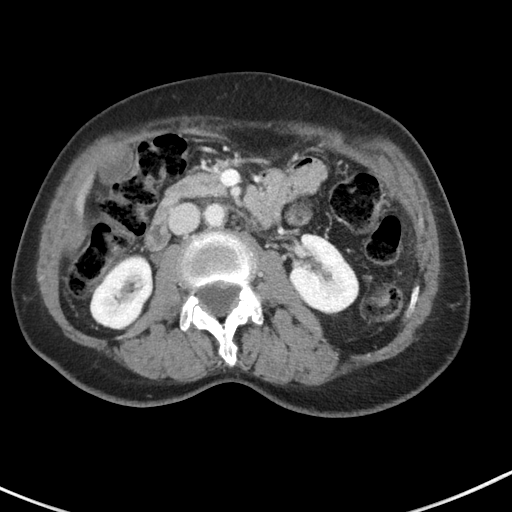
[im 55/86  bone]
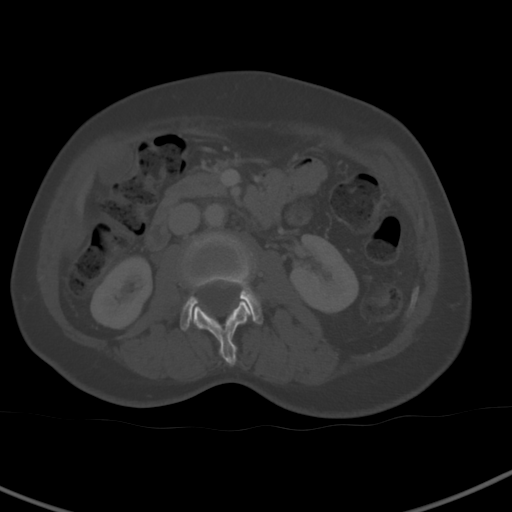
[im 62/86  soft-tissue]
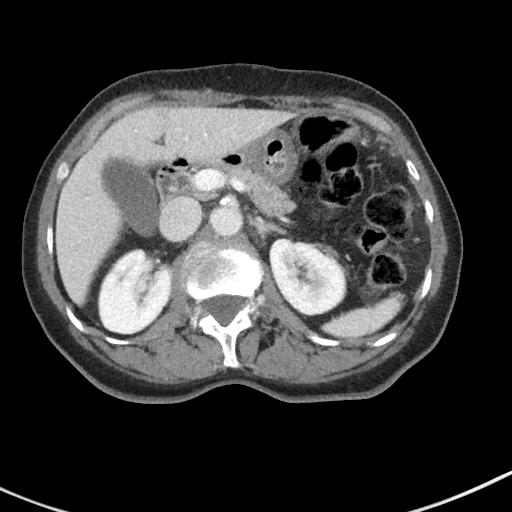
[im 66/86  soft-tissue]
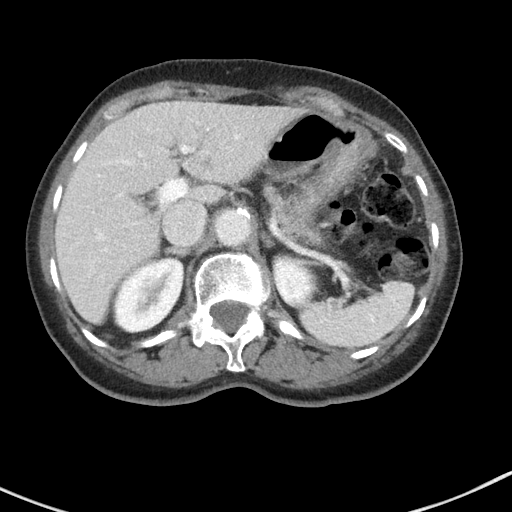
[im 74/86  soft-tissue]
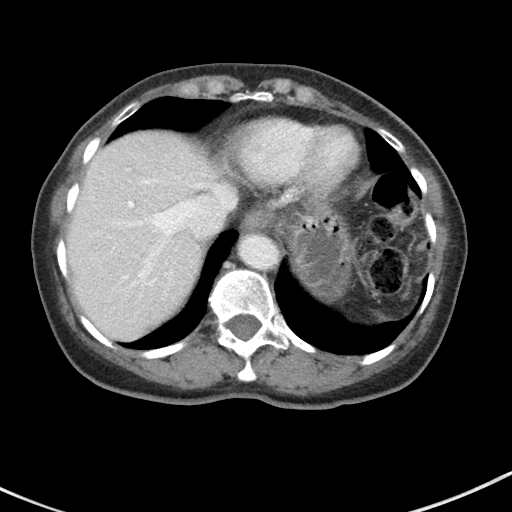
[im 82/86  soft-tissue]
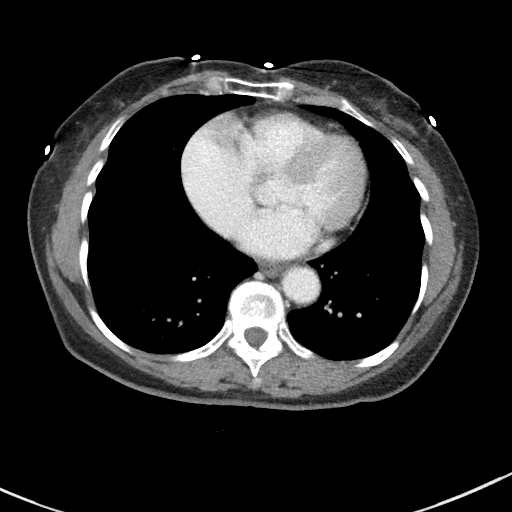

[Series 5: coronal st · coronal · 0.75mm/px · 3 of 84 slices shown]
[im 28/84  soft-tissue]
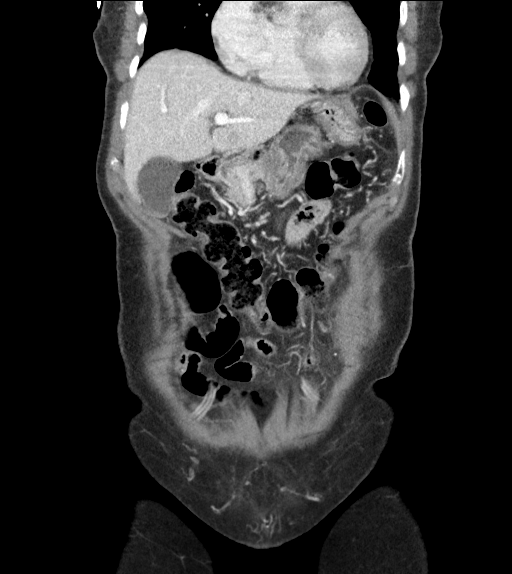
[im 37/84  soft-tissue]
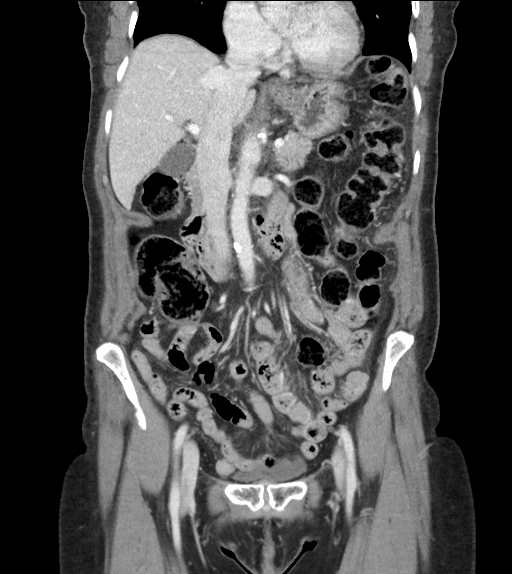
[im 47/84  soft-tissue]
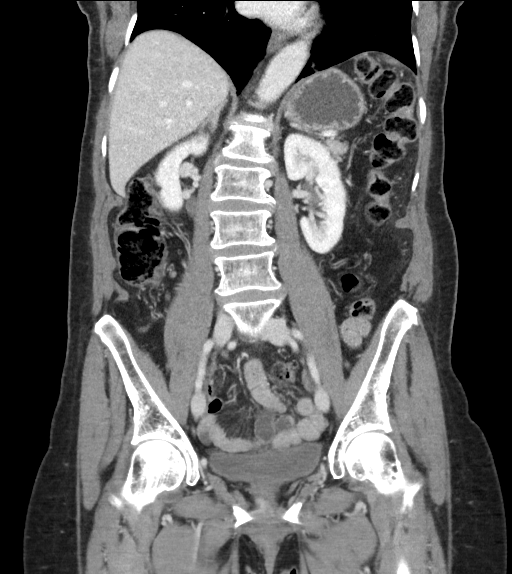

[16 of 46 positions shown; findings below may reference images not displayed]

FINDINGS: Lower chest: The lung bases are clear. The heart size is normal.

Hepatobiliary: The liver is normal. Normal gallbladder.There is no
biliary ductal dilation.

Pancreas: Normal contours without ductal dilatation. No
peripancreatic fluid collection.

Spleen: No splenic laceration or hematoma.

Adrenals/Urinary Tract:

--Adrenal glands: No adrenal hemorrhage.

--Right kidney/ureter: No hydronephrosis or perinephric hematoma.

--Left kidney/ureter: No hydronephrosis or perinephric hematoma.

--Urinary bladder: Unremarkable.

Stomach/Bowel:

--Stomach/Duodenum: There is wall thickening of the gastric body and
antrum.

--Small bowel: No dilatation or inflammation.

--Colon: No focal abnormality.

--Appendix: Normal.

Vascular/Lymphatic: Atherosclerotic calcification is present within
the non-aneurysmal abdominal aorta, without hemodynamically
significant stenosis.

--No retroperitoneal lymphadenopathy.

--No mesenteric lymphadenopathy.

--No pelvic or inguinal lymphadenopathy.

Reproductive: Unremarkable

Other: No ascites or free air. The abdominal wall is normal. There
is mild haziness of the omentum which is similar to prior study.

Musculoskeletal. No acute displaced fractures.
IMPRESSION: Mild gastric wall thickening which is nonspecific but can be seen in
patients with gastritis.

## 2021-12-09 ENCOUNTER — Encounter: Payer: Self-pay | Admitting: Family

## 2021-12-09 ENCOUNTER — Ambulatory Visit (INDEPENDENT_AMBULATORY_CARE_PROVIDER_SITE_OTHER): Payer: Medicare HMO | Admitting: Family

## 2021-12-09 VITALS — BP 139/82 | HR 63 | Temp 96.3°F | Ht 62.0 in | Wt 108.0 lb

## 2021-12-09 DIAGNOSIS — F5101 Primary insomnia: Secondary | ICD-10-CM

## 2021-12-09 DIAGNOSIS — F331 Major depressive disorder, recurrent, moderate: Secondary | ICD-10-CM

## 2021-12-09 DIAGNOSIS — E441 Mild protein-calorie malnutrition: Secondary | ICD-10-CM | POA: Diagnosis not present

## 2021-12-09 DIAGNOSIS — R636 Underweight: Secondary | ICD-10-CM

## 2021-12-09 DIAGNOSIS — E782 Mixed hyperlipidemia: Secondary | ICD-10-CM

## 2021-12-09 MED ORDER — MIRTAZAPINE 15 MG PO TBDP: 15.0000 mg | ORAL_TABLET | Freq: Every day | ORAL | 1 refills | Status: DC

## 2021-12-09 MED ORDER — CITALOPRAM HYDROBROMIDE 40 MG PO TABS: 40.0000 mg | ORAL_TABLET | Freq: Every day | ORAL | 1 refills | Status: DC

## 2021-12-09 MED ORDER — ATORVASTATIN CALCIUM 20 MG PO TABS: 20.0000 mg | ORAL_TABLET | Freq: Every day | ORAL | 1 refills | Status: DC

## 2021-12-09 NOTE — Patient Instructions (Signed)
High-Protein and High-Calorie Diet Eating high-protein and high-calorie foods can help you to gain weight, heal after an injury, and recover after an illness or surgery. The specific amount of daily protein and calories you need depends on: Your body weight. The reason this diet is recommended for you. Generally, a high-protein, high-calorie diet involves: Eating 250-500 extra calories each day. Making sure that you get enough of your daily calories from protein. Ask your health care provider how many of your calories should come from protein. Talk with a health care provider or a dietitian about how much protein and how many calories you need each day. Follow the diet as directed by your health care provider. What are tips for following this plan?  Reading food labels Check the nutrition facts label for calories, grams of fat and protein. Items with more than 4 grams of protein are high-protein foods. Preparing meals Add whole milk, half-and-half, or heavy cream to cereal, pudding, soup, or hot cocoa. Add whole milk to instant breakfast drinks. Add peanut butter to oatmeal or smoothies. Add powdered milk to baked goods, smoothies, or milkshakes. Add powdered milk, cream, or butter to mashed potatoes. Add cheese to cooked vegetables. Make whole-milk yogurt parfaits. Top them with granola, fruit, or nuts. Add cottage cheese to fruit. Add avocado, cheese, or both to sandwiches or salads. Add avocado to smoothies. Add meat, poultry, or seafood to rice, pasta, casseroles, salads, and soups. Use mayonnaise when making egg salad, chicken salad, or tuna salad. Use peanut butter as a dip for fruits and vegetables or as a topping for pretzels, celery, or crackers. Add beans to casseroles, dips, and spreads. Add pureed beans to sauces and soups. Replace calorie-free drinks with calorie-containing drinks, such as milk and fruit juice. Replace water with milk or heavy cream when making foods such as  oatmeal, pudding, or cocoa. Add oil or butter to cooked vegetables and grains. Add cream cheese to sandwiches or as a topping on crackers and bread. Make cream-based pastas and soups. General information Ask your health care provider if you should take a nutritional supplement. Try to eat six small meals each day instead of three large meals. A general goal is to eat every 2 to 3 hours. Eat a balanced diet. In each meal, include one food that is high in protein and one food with fat in it. Keep nutritious snacks available, such as nuts, trail mixes, dried fruit, and yogurt. If you have kidney disease or diabetes, talk with your health care provider about how much protein is safe for you. Too much protein may put extra stress on your kidneys. Drink your calories. Choose high-calorie drinks and have them after your meals. Consider setting a timer to remind you to eat. You will want to eat even if you do not feel very hungry. What high-protein foods should I eat?  Vegetables Soybeans. Peas. Grains Quinoa. Bulgur wheat. Buckwheat. Meats and other proteins Beef, pork, and poultry. Fish and seafood. Eggs. Tofu. Textured vegetable protein (TVP). Peanut butter. Nuts and seeds. Dried beans. Protein powders. Hummus. Dairy Whole milk. Whole-milk yogurt. Powdered milk. Cheese. Cottage Cheese. Eggnog. Beverages High-protein supplement drinks. Soy milk. Other foods Protein bars. The items listed above may not be a complete list of foods and beverages you can eat and drink. Contact a dietitian for more information. What high-calorie foods should I eat? Fruits Dried fruit. Fruit leather. Canned fruit in syrup. Fruit juice. Avocado. Vegetables Vegetables cooked in oil or butter. Fried potatoes. Grains   Pasta. Quick breads. Muffins. Pancakes. Ready-to-eat cereal. Meats and other proteins Peanut butter. Nuts and seeds. Dairy Heavy cream. Whipped cream. Cream cheese. Sour cream. Ice cream. Custard.  Pudding. Whole milk dairy products. Beverages Meal-replacement beverages. Nutrition shakes. Fruit juice. Seasonings and condiments Salad dressing. Mayonnaise. Alfredo sauce. Fruit preserves or jelly. Honey. Syrup. Sweets and desserts Cake. Cookies. Pie. Pastries. Candy bars. Chocolate. Fats and oils Butter or margarine. Oil. Gravy. Other foods Meal-replacement bars. The items listed above may not be a complete list of foods and beverages you can eat and drink. Contact a dietitian for more information. Summary A high-protein, high-calorie diet can help you gain weight or heal faster after an injury, illness, or surgery. To increase your protein and calories, add ingredients such as whole milk, peanut butter, cheese, beans, meat, or seafood to meal items. To get enough extra calories each day, include high-calorie foods and beverages at each meal. Adding a high-calorie drink or shake can be an easy way to help you get enough calories each day. Talk with your healthcare provider or dietitian about the best options for you. This information is not intended to replace advice given to you by your health care provider. Make sure you discuss any questions you have with your health care provider. Document Revised: 04/01/2020 Document Reviewed: 04/01/2020 Elsevier Patient Education  2023 [employer] Inc[employer]  

## 2021-12-09 NOTE — Progress Notes (Signed)
Subjective:    Patient ID: Kathryn Stein, female    DOB: 1946-01-24, 76 y.o.   MRN: [id number]  Chief Complaint  Patient presents with   Weight Loss    PATIENT STATES SHE DOES NOT EAT GOOD    Insomnia    GOT WORSE WHEN HUSBAND DIED MARCH THE 10TH    PT presents to the office today with complaints of weight loss. States since her appendix surgery 11/27/20 she has lost 17 lbs over the last few years.   Her husband passed in March 2023 and has had trouble sleeping.      12/09/2021   12:12 PM 03/19/2019    6:15 AM 03/17/2019    2:05 PM  Last 3 Weights  Weight (lbs) 108 lb 132 lb 135 lb  Weight (kg) 48.988 kg 59.875 kg 61.236 kg     States she has not taken any of her medications the last year.   Insomnia Primary symptoms: no sleep disturbance, difficulty falling asleep, frequent awakening.   The current episode started more than one year. The onset quality is gradual. The problem occurs intermittently. Past treatments include meditation. The treatment provided no relief. PMH includes: depression.   Depression        This is a chronic problem.  The current episode started more than 1 year ago.   Associated symptoms include fatigue, helplessness, hopelessness, insomnia, restlessness and sad.     The symptoms are aggravated by family issues.  Past treatments include SSRIs - Selective serotonin reuptake inhibitors. Hyperlipidemia This is a chronic problem. The current episode started more than 1 year ago. The problem is controlled. Current antihyperlipidemic treatment includes statins. The current treatment provides moderate improvement of lipids. Risk factors for coronary artery disease include dyslipidemia, hypertension and a sedentary lifestyle.      Review of Systems  Constitutional:  Positive for fatigue.  Psychiatric/Behavioral:  Positive for depression. Negative for sleep disturbance. The patient has insomnia.   All other systems reviewed and are negative.      Objective:    Physical Exam Vitals reviewed.  Constitutional:      General: She is not in acute distress.    Appearance: Normal appearance. She is well-developed.     Comments: underweight  HENT:     Head: Normocephalic and atraumatic.     Right Ear: External ear normal.  Eyes:     Pupils: Pupils are equal, round, and reactive to light.  Neck:     Thyroid: No thyromegaly.  Cardiovascular:     Rate and Rhythm: Normal rate and regular rhythm.     Heart sounds: Normal heart sounds. No murmur heard. Pulmonary:     Effort: Pulmonary effort is normal. No respiratory distress.     Breath sounds: Normal breath sounds. No wheezing.  Abdominal:     General: Bowel sounds are normal. There is no distension.     Palpations: Abdomen is soft.     Tenderness: There is no abdominal tenderness.  Musculoskeletal:        General: No tenderness. Normal range of motion.     Cervical back: Normal range of motion and neck supple.  Skin:    General: Skin is warm and dry.  Neurological:     Mental Status: She is alert and oriented to person, place, and time.     Cranial Nerves: No cranial nerve deficit.     Deep Tendon Reflexes: Reflexes are normal and symmetric.  Psychiatric:  Behavior: Behavior normal.        Thought Content: Thought content normal.        Judgment: Judgment normal.      BP 139/82   Pulse 63   Temp (!) 96.3 F (35.7 C) (Temporal)   Ht $R'5\' 2"'nt$  (1.575 m)   Wt 108 lb (49 kg)   SpO2 98%   BMI 19.75 kg/m      Assessment & Plan:  VALLA PACEY comes in today with chief complaint of Weight Loss (PATIENT STATES SHE DOES NOT EAT GOOD ) and Insomnia (GOT WORSE WHEN HUSBAND DIED MARCH THE 10TH )   Diagnosis and orders addressed:  1. Moderate episode of recurrent major depressive disorder (HCC) Restart Celexa and will start Remeron 15 mg  Stress management  - citalopram (CELEXA) 40 MG tablet; Take 1 tablet (40 mg total) by mouth daily.  Dispense: 90 tablet; Refill: 1 - mirtazapine  (REMERON SOL-TAB) 15 MG disintegrating tablet; Take 1 tablet (15 mg total) by mouth at bedtime.  Dispense: 90 tablet; Refill: 1 - CMP14+EGFR - CBC with Differential/Platelet  2. Mild protein-calorie malnutrition (HCC) Ensure TID with meals  High protein diet - mirtazapine (REMERON SOL-TAB) 15 MG disintegrating tablet; Take 1 tablet (15 mg total) by mouth at bedtime.  Dispense: 90 tablet; Refill: 1 - CMP14+EGFR - CBC with Differential/Platelet  3. Underweight - CMP14+EGFR - CBC with Differential/Platelet  4. Primary insomnia Will start Remeron - mirtazapine (REMERON SOL-TAB) 15 MG disintegrating tablet; Take 1 tablet (15 mg total) by mouth at bedtime.  Dispense: 90 tablet; Refill: 1 - CMP14+EGFR - CBC with Differential/Platelet  5. Mixed hyperlipidemia - atorvastatin (LIPITOR) 20 MG tablet; Take 1 tablet (20 mg total) by mouth daily.  Dispense: 90 tablet; Refill: 1 - CMP14+EGFR - CBC with Differential/Platelet   Labs pending Health Maintenance reviewed Diet and exercise encouraged  Follow up plan: 1 month to follow up on Insomnia and weight loss   Evelina Dun, FNP

## 2021-12-10 LAB — CBC WITH DIFFERENTIAL/PLATELET
Basophils Absolute: 0 10*3/uL (ref 0.0–0.2)
Basos: 1 %
EOS (ABSOLUTE): 0 10*3/uL (ref 0.0–0.4)
Eos: 0 %
Hematocrit: 41.1 % (ref 34.0–46.6)
Hemoglobin: 13 g/dL (ref 11.1–15.9)
Immature Grans (Abs): 0 10*3/uL (ref 0.0–0.1)
Immature Granulocytes: 1 %
Lymphocytes Absolute: 1.3 10*3/uL (ref 0.7–3.1)
Lymphs: 32 %
MCH: 29.7 pg (ref 26.6–33.0)
MCHC: 31.6 g/dL (ref 31.5–35.7)
MCV: 94 fL (ref 79–97)
Monocytes Absolute: 0.3 10*3/uL (ref 0.1–0.9)
Monocytes: 7 %
Neutrophils Absolute: 2.4 10*3/uL (ref 1.4–7.0)
Neutrophils: 59 %
Platelets: 310 10*3/uL (ref 150–450)
RBC: 4.37 x10E6/uL (ref 3.77–5.28)
RDW: 12.9 % (ref 11.7–15.4)
WBC: 4 10*3/uL (ref 3.4–10.8)

## 2021-12-10 LAB — CMP14+EGFR
ALT: 13 IU/L (ref 0–32)
AST: 20 IU/L (ref 0–40)
Albumin/Globulin Ratio: 2.3 — ABNORMAL HIGH (ref 1.2–2.2)
Albumin: 4.6 g/dL (ref 3.8–4.8)
Alkaline Phosphatase: 67 IU/L (ref 44–121)
BUN/Creatinine Ratio: 25 (ref 12–28)
BUN: 18 mg/dL (ref 8–27)
Bilirubin Total: 0.5 mg/dL (ref 0.0–1.2)
CO2: 27 mmol/L (ref 20–29)
Calcium: 10 mg/dL (ref 8.7–10.3)
Chloride: 102 mmol/L (ref 96–106)
Creatinine, Ser: 0.71 mg/dL (ref 0.57–1.00)
Globulin, Total: 2 g/dL (ref 1.5–4.5)
Glucose: 85 mg/dL (ref 70–99)
Potassium: 4.9 mmol/L (ref 3.5–5.2)
Sodium: 143 mmol/L (ref 134–144)
Total Protein: 6.6 g/dL (ref 6.0–8.5)
eGFR: 89 mL/min/{1.73_m2} (ref >59)

## 2022-01-09 ENCOUNTER — Ambulatory Visit: Payer: Medicare HMO | Admitting: Family

## 2022-01-15 ENCOUNTER — Encounter: Payer: Self-pay | Admitting: Family

## 2022-02-14 ENCOUNTER — Ambulatory Visit: Payer: Medicare HMO | Admitting: Family

## 2022-02-14 ENCOUNTER — Encounter: Payer: Self-pay | Admitting: Family

## 2022-03-14 ENCOUNTER — Ambulatory Visit (INDEPENDENT_AMBULATORY_CARE_PROVIDER_SITE_OTHER): Payer: Medicare HMO

## 2022-03-14 ENCOUNTER — Ambulatory Visit (INDEPENDENT_AMBULATORY_CARE_PROVIDER_SITE_OTHER): Payer: Medicare HMO | Admitting: Family

## 2022-03-14 ENCOUNTER — Encounter: Payer: Self-pay | Admitting: Family

## 2022-03-14 VITALS — BP 170/102 | HR 74 | Temp 97.0°F | Ht 62.0 in | Wt 119.0 lb

## 2022-03-14 DIAGNOSIS — E782 Mixed hyperlipidemia: Secondary | ICD-10-CM

## 2022-03-14 DIAGNOSIS — E559 Vitamin D deficiency, unspecified: Secondary | ICD-10-CM | POA: Diagnosis not present

## 2022-03-14 DIAGNOSIS — Z23 Encounter for immunization: Secondary | ICD-10-CM | POA: Diagnosis not present

## 2022-03-14 DIAGNOSIS — I10 Essential (primary) hypertension: Secondary | ICD-10-CM | POA: Diagnosis not present

## 2022-03-14 DIAGNOSIS — M810 Age-related osteoporosis without current pathological fracture: Secondary | ICD-10-CM

## 2022-03-14 DIAGNOSIS — Z0001 Encounter for general adult medical examination with abnormal findings: Secondary | ICD-10-CM | POA: Diagnosis not present

## 2022-03-14 DIAGNOSIS — Z0000 Encounter for general adult medical examination without abnormal findings: Secondary | ICD-10-CM

## 2022-03-14 DIAGNOSIS — Z780 Asymptomatic menopausal state: Secondary | ICD-10-CM

## 2022-03-14 DIAGNOSIS — F331 Major depressive disorder, recurrent, moderate: Secondary | ICD-10-CM

## 2022-03-14 DIAGNOSIS — E441 Mild protein-calorie malnutrition: Secondary | ICD-10-CM

## 2022-03-14 MED ORDER — AMLODIPINE BESYLATE 5 MG PO TABS: 5.0000 mg | ORAL_TABLET | Freq: Every day | ORAL | 1 refills | Status: DC

## 2022-03-14 NOTE — Patient Instructions (Signed)
Hypertension, Adult High blood pressure (hypertension) is when the force of blood pumping through the arteries is too strong. The arteries are the blood vessels that carry blood from the heart throughout the body. Hypertension forces the heart to work harder to pump blood and may cause arteries to become narrow or stiff. Untreated or uncontrolled hypertension can lead to a heart attack, heart failure, a stroke, kidney disease, and other problems. A blood pressure reading consists of a higher number over a lower number. Ideally, your blood pressure should be below 120/80. The first ("top") number is called the systolic pressure. It is a measure of the pressure in your arteries as your heart beats. The second ("bottom") number is called the diastolic pressure. It is a measure of the pressure in your arteries as the heart relaxes. What are the causes? The exact cause of this condition is not known. There are some conditions that result in high blood pressure. What increases the risk? Certain factors may make you more likely to develop high blood pressure. Some of these risk factors are under your control, including: Smoking. Not getting enough exercise or physical activity. Being overweight. Having too much fat, sugar, calories, or salt (sodium) in your diet. Drinking too much alcohol. Other risk factors include: Having a personal history of heart disease, diabetes, high cholesterol, or kidney disease. Stress. Having a family history of high blood pressure and high cholesterol. Having obstructive sleep apnea. Age. The risk increases with age. What are the signs or symptoms? High blood pressure may not cause symptoms. Very high blood pressure (hypertensive crisis) may cause: Headache. Fast or irregular heartbeats (palpitations). Shortness of breath. Nosebleed. Nausea and vomiting. Vision changes. Severe chest pain, dizziness, and seizures. How is this diagnosed? This condition is diagnosed by  measuring your blood pressure while you are seated, with your arm resting on a flat surface, your legs uncrossed, and your feet flat on the floor. The cuff of the blood pressure monitor will be placed directly against the skin of your upper arm at the level of your heart. Blood pressure should be measured at least twice using the same arm. Certain conditions can cause a difference in blood pressure between your right and left arms. If you have a high blood pressure reading during one visit or you have normal blood pressure with other risk factors, you may be asked to: Return on a different day to have your blood pressure checked again. Monitor your blood pressure at home for 1 week or longer. If you are diagnosed with hypertension, you may have other blood or imaging tests to help your health care provider understand your overall risk for other conditions. How is this treated? This condition is treated by making healthy lifestyle changes, such as eating healthy foods, exercising more, and reducing your alcohol intake. You may be referred for counseling on a healthy diet and physical activity. Your health care provider may prescribe medicine if lifestyle changes are not enough to get your blood pressure under control and if: Your systolic blood pressure is above 130. Your diastolic blood pressure is above 80. Your personal target blood pressure may vary depending on your medical conditions, your age, and other factors. Follow these instructions at home: Eating and drinking  Eat a diet that is high in fiber and potassium, and low in sodium, added sugar, and fat. An example of this eating plan is called the DASH diet. DASH stands for Dietary Approaches to Stop Hypertension. To eat this way: Eat   plenty of fresh fruits and vegetables. Try to fill one half of your plate at each meal with fruits and vegetables. Eat whole grains, such as whole-wheat pasta, brown rice, or whole-grain bread. Fill about one  fourth of your plate with whole grains. Eat or drink low-fat dairy products, such as skim milk or low-fat yogurt. Avoid fatty cuts of meat, processed or cured meats, and poultry with skin. Fill about one fourth of your plate with lean proteins, such as fish, chicken without skin, beans, eggs, or tofu. Avoid pre-made and processed foods. These tend to be higher in sodium, added sugar, and fat. Reduce your daily sodium intake. Many people with hypertension should eat less than 1,500 mg of sodium a day. Do not drink alcohol if: Your health care provider tells you not to drink. You are pregnant, may be pregnant, or are planning to become pregnant. If you drink alcohol: Limit how much you have to: 0-1 drink a day for women. 0-2 drinks a day for men. Know how much alcohol is in your drink. In the [place] one drink equals one 12 oz bottle of beer (355 mL), one 5 oz glass of wine (148 mL), or one 1 oz glass of hard liquor (44 mL). Lifestyle  Work with your health care provider to maintain a healthy body weight or to lose weight. Ask what an ideal weight is for you. Get at least 30 minutes of exercise that causes your heart to beat faster (aerobic exercise) most days of the week. Activities may include walking, swimming, or biking. Include exercise to strengthen your muscles (resistance exercise), such as Pilates or lifting weights, as part of your weekly exercise routine. Try to do these types of exercises for 30 minutes at least 3 days a week. Do not use any products that contain nicotine or tobacco. These products include cigarettes, chewing tobacco, and vaping devices, such as e-cigarettes. If you need help quitting, ask your health care provider. Monitor your blood pressure at home as told by your health care provider. Keep all follow-up visits. This is important. Medicines Take over-the-counter and prescription medicines only as told by your health care provider. Follow directions carefully. Blood  pressure medicines must be taken as prescribed. Do not skip doses of blood pressure medicine. Doing this puts you at risk for problems and can make the medicine less effective. Ask your health care provider about side effects or reactions to medicines that you should watch for. Contact a health care provider if you: Think you are having a reaction to a medicine you are taking. Have headaches that keep coming back (recurring). Feel dizzy. Have swelling in your ankles. Have trouble with your vision. Get help right away if you: Develop a severe headache or confusion. Have unusual weakness or numbness. Feel faint. Have severe pain in your chest or abdomen. Vomit repeatedly. Have trouble breathing. These symptoms may be an emergency. Get help right away. Call 911. Do not wait to see if the symptoms will go away. Do not drive yourself to the hospital. Summary Hypertension is when the force of blood pumping through your arteries is too strong. If this condition is not controlled, it may put you at risk for serious complications. Your personal target blood pressure may vary depending on your medical conditions, your age, and other factors. For most people, a normal blood pressure is less than 120/80. Hypertension is treated with lifestyle changes, medicines, or a combination of both. Lifestyle changes include losing weight, eating a healthy,   low-sodium diet, exercising more, and limiting alcohol. This information is not intended to replace advice given to you by your health care provider. Make sure you discuss any questions you have with your health care provider. Document Revised: 03/05/2021 Document Reviewed: 03/05/2021 Elsevier Patient Education  2023 [employer]  

## 2022-03-14 NOTE — Progress Notes (Signed)
Subjective:    Patient ID: Kathryn Stein, female    DOB: 07/21/1945, 76 y.o.   MRN: [id number]  Chief Complaint  Patient presents with   Hypertension   PT presents to the office today for CPE and chronic follow up.  Her husband passed in March 2023 and has had trouble sleeping.   She has gained 11 lbs since our last visit. We started her on Remeron 15 mg for protein malnutrition.      03/14/2022    8:48 AM 12/09/2021   12:12 PM 03/19/2019    6:15 AM  Last 3 Weights  Weight (lbs) 119 lb 108 lb 132 lb  Weight (kg) 53.978 kg 48.988 kg 59.875 kg     She has osteoporosis. She stopped her Evista. Her last dexa scan 04/12/18.  Hypertension This is a chronic problem. The current episode started more than 1 year ago. The problem has been waxing and waning since onset. The problem is uncontrolled. Associated symptoms include anxiety and malaise/fatigue. Pertinent negatives include no peripheral edema or shortness of breath. Risk factors for coronary artery disease include dyslipidemia. The current treatment provides moderate improvement.  Depression        This is a chronic problem.  The current episode started more than 1 year ago.   The onset quality is gradual.   The problem occurs intermittently.  Associated symptoms include fatigue.  Associated symptoms include no helplessness, no hopelessness, no decreased interest and not sad.     The symptoms are aggravated by family issues.  Past treatments include SSRIs - Selective serotonin reuptake inhibitors.  Past medical history includes anxiety.   Anxiety Presents for follow-up visit. Symptoms include excessive worry and nervous/anxious behavior. Patient reports no depressed mood, irritability or shortness of breath. Symptoms occur occasionally. The severity of symptoms is mild.    Hyperlipidemia This is a chronic problem. The current episode started more than 1 year ago. The problem is controlled. Pertinent negatives include no shortness of  breath. Current antihyperlipidemic treatment includes statins. The current treatment provides moderate improvement of lipids. Risk factors for coronary artery disease include dyslipidemia, hypertension and a sedentary lifestyle.      Review of Systems  Constitutional:  Positive for fatigue and malaise/fatigue. Negative for irritability.  Respiratory:  Negative for shortness of breath.   Psychiatric/Behavioral:  Positive for depression. The patient is nervous/anxious.   All other systems reviewed and are negative.  Family History  Problem Relation Age of Onset   Colon cancer Mother    Diabetes Mother    Cancer Mother    Heart disease Mother    Hypertension Mother    Stroke Mother    Cancer Brother        throat - smoker   Kidney disease Brother    Prostate cancer Brother    Heart disease Sister    Cancer Sister        breast   Diabetes Sister    Liver disease Brother    Diabetes Brother    Hypertension Brother    Drug abuse Brother    Stroke Father 68   Diabetes Sister    Heart disease Sister    Hypertension Sister    Diabetes Sister    Cancer Sister    Cancer Brother        prostate   Stomach cancer Other        niece   Kidney cancer Other        niece  Cancer Daughter        kidney   Alcohol abuse Brother    Early death Sister    Heart disease Sister    Hypertension Sister    Hypertension Sister    Diabetes Sister    Mental illness Daughter    Social History   Socioeconomic History   Marital status: Married    Spouse name: Not on file   Number of children: 5   Years of education: 6   Highest education level: 6th grade  Occupational History   Occupation: disability/ retired    Fish farm manager: [employer]  Tobacco Use   Smoking status: Never   Smokeless tobacco: Never  Vaping Use   Vaping Use: Never used  Substance and Sexual Activity   Alcohol use: No   Drug use: No   Sexual activity: Yes  Other Topics Concern   Not on file  Social History Narrative    Not on file   Social Determinants of Health   Financial Resource Strain: Low Risk  (08/18/2017)   Overall Financial Resource Strain (CARDIA)    Difficulty of Paying Living Expenses: Not very hard  Food Insecurity: No Food Insecurity (08/18/2017)   Hunger Vital Sign    Worried About Running Out of Food in the Last Year: Never true    Ran Out of Food in the Last Year: Never true  Transportation Needs: No Transportation Needs (08/18/2017)   PRAPARE - Hydrologist (Medical): No    Lack of Transportation (Non-Medical): No  Physical Activity: Unknown (09/16/2018)   Exercise Vital Sign    Days of Exercise per Week: 5 days    Minutes of Exercise per Session: Not on file  Stress: Stress Concern Present (08/18/2017)   [place]    Feeling of Stress : To some extent  Social Connections: Unknown (09/16/2018)   Social Connection and Isolation Panel [NHANES]    Frequency of Communication with Friends and Family: Not on file    Frequency of Social Gatherings with Friends and Family: Not on file    Attends Religious Services: Not on file    Active Member of Clubs or Organizations: No    Attends Archivist Meetings: Never    Marital Status: Not on file       Objective:   Physical Exam Vitals reviewed.  Constitutional:      General: She is not in acute distress.    Appearance: She is well-developed.  HENT:     Head: Normocephalic and atraumatic.     Right Ear: Tympanic membrane normal.     Left Ear: Tympanic membrane normal.  Eyes:     Pupils: Pupils are equal, round, and reactive to light.  Neck:     Thyroid: No thyromegaly.  Cardiovascular:     Rate and Rhythm: Normal rate and regular rhythm.     Heart sounds: Normal heart sounds. No murmur heard. Pulmonary:     Effort: Pulmonary effort is normal. No respiratory distress.     Breath sounds: Normal breath sounds. No wheezing.  Abdominal:      General: Bowel sounds are normal. There is no distension.     Palpations: Abdomen is soft.     Tenderness: There is no abdominal tenderness.  Musculoskeletal:        General: No tenderness. Normal range of motion.     Cervical back: Normal range of motion and neck supple.  Skin:  General: Skin is warm and dry.  Neurological:     Mental Status: She is alert and oriented to person, place, and time.     Cranial Nerves: No cranial nerve deficit.     Deep Tendon Reflexes: Reflexes are normal and symmetric.  Psychiatric:        Behavior: Behavior normal.        Thought Content: Thought content normal.        Judgment: Judgment normal.      BP (!) 170/102   Pulse 74   Temp (!) 97 F (36.1 C)   Ht _0  (1.575 m)   Wt 119 lb (54 kg)   SpO2 98%   BMI 21.77 kg/m       Assessment & Plan:  Kathryn Stein comes in today with chief complaint of Hypertension   Diagnosis and orders addressed:  1. Need for immunization against influenza - Flu Vaccine QUAD High Dose(Fluad) - CMP14+EGFR - CBC with Differential/Platelet  2. Essential hypertension, benign Start Norvasc 5 mg  -Daily blood pressure log given with instructions on how to fill out and told to bring to next visit -Dash diet information given -Exercise encouraged - Stress Management  -Continue current meds -RTO in 2 weeks  - amLODipine (NORVASC) 5 MG tablet; Take 1 tablet (5 mg total) by mouth daily.  Dispense: 90 tablet; Refill: 1 - CMP14+EGFR - CBC with Differential/Platelet  3. Vitamin D deficiency - CMP14+EGFR - CBC with Differential/Platelet - VITAMIN D 25 Hydroxy (Vit-D Deficiency, Fractures)  4. Mixed hyperlipidemia - CMP14+EGFR - CBC with Differential/Platelet  5. Mild protein-calorie malnutrition ([place]) - CMP14+EGFR - CBC with Differential/Platelet - DG [facility] DEXA  6. Moderate episode of recurrent major depressive disorder (HCC) - CMP14+EGFR - CBC with Differential/Platelet  7. Age-related  osteoporosis without current pathological fracture - CMP14+EGFR - CBC with Differential/Platelet  8. Annual physical exam  - CMP14+EGFR - CBC with Differential/Platelet - Lipid panel - TSH - VITAMIN D 25 Hydroxy (Vit-D Deficiency, Fractures) - DG [facility] DEXA   Labs pending Health Maintenance reviewed Diet and exercise encouraged  Follow up plan: 2 weeks for BP   Evelina Dun, FNP

## 2022-03-15 LAB — CMP14+EGFR
ALT: 13 IU/L (ref 0–32)
AST: 20 IU/L (ref 0–40)
Albumin/Globulin Ratio: 2.4 — ABNORMAL HIGH (ref 1.2–2.2)
Albumin: 4.5 g/dL (ref 3.8–4.8)
Alkaline Phosphatase: 81 IU/L (ref 44–121)
BUN/Creatinine Ratio: 23 (ref 12–28)
BUN: 20 mg/dL (ref 8–27)
Bilirubin Total: 0.4 mg/dL (ref 0.0–1.2)
CO2: 29 mmol/L (ref 20–29)
Calcium: 10 mg/dL (ref 8.7–10.3)
Chloride: 102 mmol/L (ref 96–106)
Creatinine, Ser: 0.88 mg/dL (ref 0.57–1.00)
Globulin, Total: 1.9 g/dL (ref 1.5–4.5)
Glucose: 68 mg/dL — ABNORMAL LOW (ref 70–99)
Potassium: 4.5 mmol/L (ref 3.5–5.2)
Sodium: 143 mmol/L (ref 134–144)
Total Protein: 6.4 g/dL (ref 6.0–8.5)
eGFR: 68 mL/min/{1.73_m2} (ref >59)

## 2022-03-15 LAB — CBC WITH DIFFERENTIAL/PLATELET
Basophils Absolute: 0 10*3/uL (ref 0.0–0.2)
Basos: 1 %
EOS (ABSOLUTE): 0 10*3/uL (ref 0.0–0.4)
Eos: 1 %
Hematocrit: 39.5 % (ref 34.0–46.6)
Hemoglobin: 12.6 g/dL (ref 11.1–15.9)
Immature Grans (Abs): 0 10*3/uL (ref 0.0–0.1)
Immature Granulocytes: 0 %
Lymphocytes Absolute: 1.3 10*3/uL (ref 0.7–3.1)
Lymphs: 34 %
MCH: 30.3 pg (ref 26.6–33.0)
MCHC: 31.9 g/dL (ref 31.5–35.7)
MCV: 95 fL (ref 79–97)
Monocytes Absolute: 0.3 10*3/uL (ref 0.1–0.9)
Monocytes: 9 %
Neutrophils Absolute: 2.2 10*3/uL (ref 1.4–7.0)
Neutrophils: 55 %
Platelets: 297 10*3/uL (ref 150–450)
RBC: 4.16 x10E6/uL (ref 3.77–5.28)
RDW: 13.2 % (ref 11.7–15.4)
WBC: 4 10*3/uL (ref 3.4–10.8)

## 2022-03-15 LAB — TSH: TSH: 3.19 u[IU]/mL (ref 0.450–4.500)

## 2022-03-15 LAB — LIPID PANEL
Chol/HDL Ratio: 2.1 ratio (ref 0.0–4.4)
Cholesterol, Total: 202 mg/dL — ABNORMAL HIGH (ref 100–199)
HDL: 95 mg/dL (ref >39)
LDL Chol Calc (NIH): 98 mg/dL (ref 0–99)
Triglycerides: 48 mg/dL (ref 0–149)
VLDL Cholesterol Cal: 9 mg/dL (ref 5–40)

## 2022-03-15 LAB — VITAMIN D 25 HYDROXY (VIT D DEFICIENCY, FRACTURES): Vit D, 25-Hydroxy: 54.2 ng/mL (ref 30.0–100.0)

## 2022-03-28 ENCOUNTER — Encounter: Payer: Self-pay | Admitting: Family

## 2022-03-28 ENCOUNTER — Ambulatory Visit (INDEPENDENT_AMBULATORY_CARE_PROVIDER_SITE_OTHER): Payer: Medicare HMO | Admitting: Family

## 2022-03-28 VITALS — BP 155/85 | HR 61 | Temp 97.2°F | Ht 62.0 in | Wt 121.0 lb

## 2022-03-28 DIAGNOSIS — G44209 Tension-type headache, unspecified, not intractable: Secondary | ICD-10-CM

## 2022-03-28 DIAGNOSIS — I10 Essential (primary) hypertension: Secondary | ICD-10-CM

## 2022-03-28 DIAGNOSIS — E441 Mild protein-calorie malnutrition: Secondary | ICD-10-CM | POA: Diagnosis not present

## 2022-03-28 MED ORDER — AMLODIPINE BESYLATE 10 MG PO TABS: 10.0000 mg | ORAL_TABLET | Freq: Every day | ORAL | 1 refills | Status: DC

## 2022-03-28 NOTE — Patient Instructions (Signed)
Hypertension, Adult High blood pressure (hypertension) is when the force of blood pumping through the arteries is too strong. The arteries are the blood vessels that carry blood from the heart throughout the body. Hypertension forces the heart to work harder to pump blood and may cause arteries to become narrow or stiff. Untreated or uncontrolled hypertension can lead to a heart attack, heart failure, a stroke, kidney disease, and other problems. A blood pressure reading consists of a higher number over a lower number. Ideally, your blood pressure should be below 120/80. The first ("top") number is called the systolic pressure. It is a measure of the pressure in your arteries as your heart beats. The second ("bottom") number is called the diastolic pressure. It is a measure of the pressure in your arteries as the heart relaxes. What are the causes? The exact cause of this condition is not known. There are some conditions that result in high blood pressure. What increases the risk? Certain factors may make you more likely to develop high blood pressure. Some of these risk factors are under your control, including: Smoking. Not getting enough exercise or physical activity. Being overweight. Having too much fat, sugar, calories, or salt (sodium) in your diet. Drinking too much alcohol. Other risk factors include: Having a personal history of heart disease, diabetes, high cholesterol, or kidney disease. Stress. Having a family history of high blood pressure and high cholesterol. Having obstructive sleep apnea. Age. The risk increases with age. What are the signs or symptoms? High blood pressure may not cause symptoms. Very high blood pressure (hypertensive crisis) may cause: Headache. Fast or irregular heartbeats (palpitations). Shortness of breath. Nosebleed. Nausea and vomiting. Vision changes. Severe chest pain, dizziness, and seizures. How is this diagnosed? This condition is diagnosed by  measuring your blood pressure while you are seated, with your arm resting on a flat surface, your legs uncrossed, and your feet flat on the floor. The cuff of the blood pressure monitor will be placed directly against the skin of your upper arm at the level of your heart. Blood pressure should be measured at least twice using the same arm. Certain conditions can cause a difference in blood pressure between your right and left arms. If you have a high blood pressure reading during one visit or you have normal blood pressure with other risk factors, you may be asked to: Return on a different day to have your blood pressure checked again. Monitor your blood pressure at home for 1 week or longer. If you are diagnosed with hypertension, you may have other blood or imaging tests to help your health care provider understand your overall risk for other conditions. How is this treated? This condition is treated by making healthy lifestyle changes, such as eating healthy foods, exercising more, and reducing your alcohol intake. You may be referred for counseling on a healthy diet and physical activity. Your health care provider may prescribe medicine if lifestyle changes are not enough to get your blood pressure under control and if: Your systolic blood pressure is above 130. Your diastolic blood pressure is above 80. Your personal target blood pressure may vary depending on your medical conditions, your age, and other factors. Follow these instructions at home: Eating and drinking  Eat a diet that is high in fiber and potassium, and low in sodium, added sugar, and fat. An example of this eating plan is called the DASH diet. DASH stands for Dietary Approaches to Stop Hypertension. To eat this way: Eat   plenty of fresh fruits and vegetables. Try to fill one half of your plate at each meal with fruits and vegetables. Eat whole grains, such as whole-wheat pasta, brown rice, or whole-grain bread. Fill about one  fourth of your plate with whole grains. Eat or drink low-fat dairy products, such as skim milk or low-fat yogurt. Avoid fatty cuts of meat, processed or cured meats, and poultry with skin. Fill about one fourth of your plate with lean proteins, such as fish, chicken without skin, beans, eggs, or tofu. Avoid pre-made and processed foods. These tend to be higher in sodium, added sugar, and fat. Reduce your daily sodium intake. Many people with hypertension should eat less than 1,500 mg of sodium a day. Do not drink alcohol if: Your health care provider tells you not to drink. You are pregnant, may be pregnant, or are planning to become pregnant. If you drink alcohol: Limit how much you have to: 0-1 drink a day for women. 0-2 drinks a day for men. Know how much alcohol is in your drink. In the [place] one drink equals one 12 oz bottle of beer (355 mL), one 5 oz glass of wine (148 mL), or one 1 oz glass of hard liquor (44 mL). Lifestyle  Work with your health care provider to maintain a healthy body weight or to lose weight. Ask what an ideal weight is for you. Get at least 30 minutes of exercise that causes your heart to beat faster (aerobic exercise) most days of the week. Activities may include walking, swimming, or biking. Include exercise to strengthen your muscles (resistance exercise), such as Pilates or lifting weights, as part of your weekly exercise routine. Try to do these types of exercises for 30 minutes at least 3 days a week. Do not use any products that contain nicotine or tobacco. These products include cigarettes, chewing tobacco, and vaping devices, such as e-cigarettes. If you need help quitting, ask your health care provider. Monitor your blood pressure at home as told by your health care provider. Keep all follow-up visits. This is important. Medicines Take over-the-counter and prescription medicines only as told by your health care provider. Follow directions carefully. Blood  pressure medicines must be taken as prescribed. Do not skip doses of blood pressure medicine. Doing this puts you at risk for problems and can make the medicine less effective. Ask your health care provider about side effects or reactions to medicines that you should watch for. Contact a health care provider if you: Think you are having a reaction to a medicine you are taking. Have headaches that keep coming back (recurring). Feel dizzy. Have swelling in your ankles. Have trouble with your vision. Get help right away if you: Develop a severe headache or confusion. Have unusual weakness or numbness. Feel faint. Have severe pain in your chest or abdomen. Vomit repeatedly. Have trouble breathing. These symptoms may be an emergency. Get help right away. Call 911. Do not wait to see if the symptoms will go away. Do not drive yourself to the hospital. Summary Hypertension is when the force of blood pumping through your arteries is too strong. If this condition is not controlled, it may put you at risk for serious complications. Your personal target blood pressure may vary depending on your medical conditions, your age, and other factors. For most people, a normal blood pressure is less than 120/80. Hypertension is treated with lifestyle changes, medicines, or a combination of both. Lifestyle changes include losing weight, eating a healthy,   low-sodium diet, exercising more, and limiting alcohol. This information is not intended to replace advice given to you by your health care provider. Make sure you discuss any questions you have with your health care provider. Document Revised: 03/05/2021 Document Reviewed: 03/05/2021 Elsevier Patient Education  2023 [employer]  

## 2022-03-28 NOTE — Progress Notes (Signed)
Subjective:    Patient ID: Kathryn Stein, female    DOB: Sep 12, 1945, 76 y.o.   MRN: [id number]  Chief Complaint  Patient presents with   Headache   Hypertension   Pt presents to the office today to follow up on HTN. She was seen on 03/14/22 and started on Norvasc 5 mg. Her BP is improved, but not at goal.   She was underweight and we started her on Remeron 15 mg. She has gained a total of 11 lbs since starting. States she is feeling much better.     03/28/2022   10:20 AM 03/14/2022    8:48 AM 12/09/2021   12:12 PM  Last 3 Weights  Weight (lbs) 121 lb 119 lb 108 lb  Weight (kg) 54.885 kg 53.978 kg 48.988 kg     Headache  This is a new problem. The problem occurs intermittently. The problem has been waxing and waning. The pain is located in the Temporal region. The pain quality is similar to prior headaches. The pain is mild. Her past medical history is significant for hypertension.  Hypertension This is a chronic problem. The current episode started more than 1 year ago. The problem has been waxing and waning since onset. The problem is uncontrolled. Associated symptoms include headaches. Pertinent negatives include no malaise/fatigue, peripheral edema or shortness of breath. Past treatments include calcium channel blockers. The current treatment provides mild improvement.      Review of Systems  Constitutional:  Negative for malaise/fatigue.  Respiratory:  Negative for shortness of breath.   Neurological:  Positive for headaches.  All other systems reviewed and are negative.      Objective:   Physical Exam Vitals reviewed.  Constitutional:      General: She is not in acute distress.    Appearance: She is well-developed.  HENT:     Head: Normocephalic and atraumatic.     Right Ear: External ear normal.  Eyes:     Pupils: Pupils are equal, round, and reactive to light.  Neck:     Thyroid: No thyromegaly.  Cardiovascular:     Rate and Rhythm: Normal rate and  regular rhythm.     Heart sounds: Normal heart sounds. No murmur heard. Pulmonary:     Effort: Pulmonary effort is normal. No respiratory distress.     Breath sounds: Normal breath sounds. No wheezing.  Abdominal:     General: Bowel sounds are normal. There is no distension.     Palpations: Abdomen is soft.     Tenderness: There is no abdominal tenderness.  Musculoskeletal:        General: No tenderness. Normal range of motion.     Cervical back: Normal range of motion and neck supple.  Skin:    General: Skin is warm and dry.  Neurological:     Mental Status: She is alert and oriented to person, place, and time.     Cranial Nerves: No cranial nerve deficit.     Deep Tendon Reflexes: Reflexes are normal and symmetric.  Psychiatric:        Behavior: Behavior normal.        Thought Content: Thought content normal.        Judgment: Judgment normal.       BP (!) 155/85   Pulse 61   Temp (!) 97.2 F (36.2 C) (Temporal)   Ht '5\' 2"'$  (1.575 m)   Wt 121 lb (54.9 kg)   SpO2 99%   BMI 22.13 kg/m  Assessment & Plan:  Kathryn Stein comes in today with chief complaint of Headache and Hypertension   Diagnosis and orders addressed:  1. Essential hypertension, benign Will increase Norvsac to 10 mg from 5 mg  -Daily blood pressure log given with instructions on how to fill out and told to bring to next visit -Dash diet information given -Exercise encouraged - Stress Management  -Continue current meds -RTO in 2 wekes  - amLODipine (NORVASC) 10 MG tablet; Take 1 tablet (10 mg total) by mouth daily.  Dispense: 90 tablet; Refill: 1  2. Acute non intractable tension-type headache -More than likely related to elevated BP  3. Mild protein-calorie malnutrition ([place]) Continue Remeron  Ensure TID with meals Hight protein diet     Health Maintenance reviewed Diet and exercise encouraged  Follow up plan: 2 weeks    Evelina Dun, FNP

## 2022-04-07 ENCOUNTER — Ambulatory Visit (INDEPENDENT_AMBULATORY_CARE_PROVIDER_SITE_OTHER): Payer: Medicare HMO

## 2022-04-07 VITALS — Ht 62.0 in | Wt 121.0 lb

## 2022-04-07 DIAGNOSIS — Z0000 Encounter for general adult medical examination without abnormal findings: Secondary | ICD-10-CM | POA: Diagnosis not present

## 2022-04-07 NOTE — Progress Notes (Signed)
Subjective:   Kathryn Stein is a 76 y.o. female who presents for Medicare Annual (Subsequent) preventive examination. I connected with  Denyce Robert on 04/07/22 by a audio enabled telemedicine application and verified that I am speaking with the correct person using two identifiers.  Patient Location: Home  Provider Location: Home Office  I discussed the limitations of evaluation and management by telemedicine. The patient expressed understanding and agreed to proceed.     Objective:    Today's Vitals   04/07/22 1329  Weight: 121 lb (54.9 kg)  Height: '5\' 2"'$  (1.575 m)   Body mass index is 22.13 kg/m.     04/07/2022    1:35 PM 03/17/2019    2:06 PM 03/13/2019    9:11 PM 09/16/2018    3:47 PM 08/18/2017   10:56 AM 06/05/2017   10:47 PM 09/21/2014    9:10 AM  Advanced Directives  Does Patient Have a Medical Advance Directive? No No No No No No No  Would patient like information on creating a medical advance directive? No - Patient declined No - Patient declined  Yes (MAU/Ambulatory/Procedural Areas - Information given) No - Patient declined No - Patient declined Yes - Educational materials given    Current Medications (verified) Outpatient Encounter Medications as of 04/07/2022  Medication Sig   amLODipine (NORVASC) 10 MG tablet Take 1 tablet (10 mg total) by mouth daily.   aspirin EC 81 MG tablet Take 81 mg by mouth daily as needed. For pain   atorvastatin (LIPITOR) 20 MG tablet Take 1 tablet (20 mg total) by mouth daily.   cholecalciferol (VITAMIN D3) 25 MCG (1000 UNIT) tablet Take 1,000 Units by mouth daily.   citalopram (CELEXA) 40 MG tablet Take 1 tablet (40 mg total) by mouth daily.   mirtazapine (REMERON SOL-TAB) 15 MG disintegrating tablet Take 1 tablet (15 mg total) by mouth at bedtime.   No facility-administered encounter medications on file as of 04/07/2022.    Allergies (verified) Codeine, Darvocet [propoxyphene n-acetaminophen], and Propoxyphene    History: Past Medical History:  Diagnosis Date   Abnormal heart rate    after birth of 3rd child   Anemia    Anorexia    Anxiety    Arthritis    Colon polyps    Depression    Dyspepsia    Gastritis    found on EGD   GERD (gastroesophageal reflux disease)    Hyperlipidemia    Hypertension    Marital relationship problem    Osteoporosis    Postmenopausal    Vertigo    Vertigo due to previous cerebellar infarction    Past Surgical History:  Procedure Laterality Date   BREAST SURGERY Left    biopsy benign   CESAREAN SECTION     per pt/ no c-section/had 5 vaginal births   COLONOSCOPY     MOUTH SURGERY     bone extraction   TUBAL LIGATION     Family History  Problem Relation Age of Onset   Colon cancer Mother    Diabetes Mother    Cancer Mother    Heart disease Mother    Hypertension Mother    Stroke Mother    Cancer Brother        throat - smoker   Kidney disease Brother    Prostate cancer Brother    Heart disease Sister    Cancer Sister        breast   Diabetes Sister    Liver  disease Brother    Diabetes Brother    Hypertension Brother    Drug abuse Brother    Stroke Father 53   Diabetes Sister    Heart disease Sister    Hypertension Sister    Diabetes Sister    Cancer Sister    Cancer Brother        prostate   Stomach cancer Other        niece   Kidney cancer Other        niece   Cancer Daughter        kidney   Alcohol abuse Brother    Early death Sister    Heart disease Sister    Hypertension Sister    Hypertension Sister    Diabetes Sister    Mental illness Daughter    Social History   Socioeconomic History   Marital status: Married    Spouse name: Not on file   Number of children: 5   Years of education: 6   Highest education level: 6th grade  Occupational History   Occupation: disability/ retired    Fish farm manager: [employer]  Tobacco Use   Smoking status: Never   Smokeless tobacco: Never  Vaping Use   Vaping Use: Never used   Substance and Sexual Activity   Alcohol use: No   Drug use: No   Sexual activity: Yes  Other Topics Concern   Not on file  Social History Narrative   Not on file   Social Determinants of Health   Financial Resource Strain: Low Risk  (04/07/2022)   Overall Financial Resource Strain (CARDIA)    Difficulty of Paying Living Expenses: Not hard at all  Food Insecurity: No Food Insecurity (04/07/2022)   Hunger Vital Sign    Worried About Running Out of Food in the Last Year: Never true    Ran Out of Food in the Last Year: Never true  Transportation Needs: No Transportation Needs (04/07/2022)   PRAPARE - Hydrologist (Medical): No    Lack of Transportation (Non-Medical): No  Physical Activity: Insufficiently Active (04/07/2022)   Exercise Vital Sign    Days of Exercise per Week: 3 days    Minutes of Exercise per Session: 30 min  Stress: No Stress Concern Present (04/07/2022)   [place]    Feeling of Stress : Not at all  Social Connections: Moderately Isolated (04/07/2022)   Social Connection and Isolation Panel [NHANES]    Frequency of Communication with Friends and Family: More than three times a week    Frequency of Social Gatherings with Friends and Family: More than three times a week    Attends Religious Services: More than 4 times per year    Active Member of [employer] or Organizations: No    Attends Archivist Meetings: Never    Marital Status: Widowed    Tobacco Counseling Counseling given: Not Answered   Clinical Intake:  Pre-visit preparation completed: Yes  Pain : No/denies pain     Nutritional Risks: None Diabetes: No  How often do you need to have someone help you when you read instructions, pamphlets, or other written materials from your doctor or pharmacy?: 1 - Never  Diabetic?no   Interpreter Needed?: No  Information entered by :: Jadene Pierini,  LPN   Activities of Daily Living    04/07/2022    1:35 PM  In your present state of health, do you have any  difficulty performing the following activities:  Hearing? 0  Vision? 0  Difficulty concentrating or making decisions? 0  Walking or climbing stairs? 0  Dressing or bathing? 0  Doing errands, shopping? 0  Preparing Food and eating ? N  Using the Toilet? N  In the past six months, have you accidently leaked urine? N  Do you have problems with loss of bowel control? N  Managing your Medications? N  Managing your Finances? N  Housekeeping or managing your Housekeeping? N    Patient Care Team: Sharion Balloon, FNP as PCP - General (Nurse Practitioner) Milus Banister, MD as Attending Physician (Gastroenterology) Rogene Houston, MD as Consulting Physician (Gastroenterology)  Indicate any recent Medical Services you may have received from other than [facility] providers in the past year (date may be approximate).     Assessment:   This is a routine wellness examination for [place].  Hearing/Vision screen Vision Screening - Comments:: Wears rx glasses - up to date with routine eye exams with  Dr.Johnson   Dietary issues and exercise activities discussed: Current Exercise Habits: Home exercise routine, Type of exercise: walking, Time (Minutes): 30, Frequency (Times/Week): 3, Weekly Exercise (Minutes/Week): 90, Intensity: Mild, Exercise limited by: None identified   Goals Addressed             This Visit's Progress    Have 3 meals a day   On track    Try to eat a balanced meal with lean proteins, fruits, and vegetables 3 times a day with a small healthy snack once or twice a day       Depression Screen    04/07/2022    1:34 PM 03/28/2022   10:19 AM 03/14/2022    9:01 AM 12/09/2021   12:10 PM 01/27/2019    2:12 PM 09/16/2018    3:38 PM 06/07/2018    8:27 AM  PHQ 2/9 Scores  PHQ - 2 Score 0 0 0 0 0 1 1  PHQ- 9 Score 0  3 0       Fall Risk    04/07/2022    1:33  PM 03/28/2022   10:19 AM 03/14/2022    8:46 AM 12/09/2021   12:11 PM 01/27/2019    2:12 PM  Fall Risk   Falls in the past year? 0 0 0 0 0  Number falls in past yr: 0      Injury with Fall? 0      Risk for fall due to : No Fall Risks   No Fall Risks   Follow up Falls prevention discussed        FALL RISK PREVENTION PERTAINING TO THE HOME:  Any stairs in or around the home? Yes  If so, are there any without handrails? No  Home free of loose throw rugs in walkways, pet beds, electrical cords, etc? No  Adequate lighting in your home to reduce risk of falls? No   ASSISTIVE DEVICES UTILIZED TO PREVENT FALLS:  Life alert? No  Use of a cane, walker or w/c? Yes  Grab bars in the bathroom? No  Shower chair or bench in shower? No  Elevated toilet seat or a handicapped toilet? No       08/18/2017   11:18 AM 09/21/2014   10:48 AM 09/21/2014    9:34 AM 07/21/2014   12:21 PM  MMSE - Mini Mental State Exam  Orientation to time '4  5 5  '$ Orientation to Place '5  5 5  '$ Registration  $'3  3 3  'O$ Attention/ Calculation '5  5 5  '$ Recall '1  2 3  '$ Language- name 2 objects '2  2 2  '$ Language- repeat '1  1 1  '$ Language- follow 3 step command '3  3 3  '$ Language- read & follow direction '1 1  1  '$ Write a sentence '1  1 1  '$ Copy design 0 1  1  Total score 26   30        04/07/2022    1:35 PM 09/16/2018    3:48 PM  6CIT Screen  What Year? 0 points 0 points  What month? 0 points 0 points  What time? 0 points 0 points  Count back from 20 0 points 0 points  Months in reverse 0 points 0 points  Repeat phrase 0 points 0 points  Total Score 0 points 0 points    Immunizations Immunization History  Administered Date(s) Administered   DTaP 07/12/2010   Fluad Quad(high Dose 65+) 03/14/2022   Influenza Split 07/21/2006   Influenza,inj,Quad PF,6+ Mos 04/05/2013, 02/15/2015   Influenza-Unspecified 02/23/2012   Moderna Sars-Covid-2 Vaccination 09/01/2019, 10/04/2019   Pneumococcal Conjugate-13 07/21/2014    Pneumococcal Polysaccharide-23 12/23/2011   Td 08/09/2010   Tdap 08/09/2010   Zoster, Live 11/22/2013    TDAP status: Due, Education has been provided regarding the importance of this vaccine. Advised may receive this vaccine at local pharmacy or Health Dept. Aware to provide a copy of the vaccination record if obtained from local pharmacy or Health Dept. Verbalized acceptance and understanding.  Flu Vaccine status: Up to date  Pneumococcal vaccine status: Up to date  Covid-19 vaccine status: Completed vaccines  Qualifies for Shingles Vaccine? Yes   Zostavax completed Yes   Shingrix Completed?: Yes  Screening Tests Health Maintenance  Topic Date Due   COVID-19 Vaccine (3 - 2023-24 season) 01/10/2022   Zoster Vaccines- Shingrix (1 of 2) 06/14/2022 (Originally 02/28/1996)   COLONOSCOPY (Pts 45-4yr Insurance coverage will need to be confirmed)  02/27/2023   Medicare Annual Wellness (AWV)  04/08/2023   DEXA SCAN  03/14/2024   Pneumonia Vaccine 76 Years old  Completed   INFLUENZA VACCINE  Completed   Hepatitis C Screening  Completed   HPV VACCINES  Aged Out    Health Maintenance  Health Maintenance Due  Topic Date Due   COVID-19 Vaccine (3 - 2023-24 season) 01/10/2022    Colorectal cancer screening: No longer required.   Mammogram status: No longer required due to age.  Bone Density status: Completed 03/14/2022. Results reflect: Bone density results: OSTEOPENIA. Repeat every 5 years.  Lung Cancer Screening: (Low Dose CT Chest recommended if Age 76-80years, 30 pack-year currently smoking OR have quit w/in 15years.) does not qualify.   Lung Cancer Screening Referral: n/a  Additional Screening:  Hepatitis C Screening: does not qualify;   Vision Screening: Recommended annual ophthalmology exams for early detection of glaucoma and other disorders of the eye. Is the patient up to date with their annual eye exam?  Yes  Who is the provider or what is the name of the  office in which the patient attends annual eye exams? Dr.Johnson  If pt is not established with a provider, would they like to be referred to a provider to establish care? No .   Dental Screening: Recommended annual dental exams for proper oral hygiene  Community Resource Referral / Chronic Care Management: CRR required this visit?  No   CCM required this visit?  No  Plan:     I have personally reviewed and noted the following in the patient's chart:   Medical and social history Use of alcohol, tobacco or illicit drugs  Current medications and supplements including opioid prescriptions. Patient is not currently taking opioid prescriptions. Functional ability and status Nutritional status Physical activity Advanced directives List of other physicians Hospitalizations, surgeries, and ER visits in previous 12 months Vitals Screenings to include cognitive, depression, and falls Referrals and appointments  In addition, I have reviewed and discussed with patient certain preventive protocols, quality metrics, and best practice recommendations. A written personalized care plan for preventive services as well as general preventive health recommendations were provided to patient.     Daphane Shepherd, LPN   [id number]   Nurse Notes: none

## 2022-04-07 NOTE — Patient Instructions (Signed)
Kathryn Stein , Thank you for taking time to come for your Medicare Wellness Visit. I appreciate your ongoing commitment to your health goals. Please review the following plan we discussed and let me know if I can assist you in the future.   These are the goals we discussed:  Goals      Exercise 150 minutes per week (moderate activity)     Walk for at least 30 minutes 3 times per week     Have 3 meals a day     Try to eat a balanced meal with lean proteins, fruits, and vegetables 3 times a day with a small healthy snack once or twice a day        This is a list of the screening recommended for you and due dates:  Health Maintenance  Topic Date Due   COVID-19 Vaccine (3 - 2023-24 season) 01/10/2022   Zoster (Shingles) Vaccine (1 of 2) 06/14/2022*   Colon Cancer Screening  02/27/2023   Medicare Annual Wellness Visit  04/08/2023   DEXA scan (bone density measurement)  03/14/2024   Pneumonia Vaccine  Completed   Flu Shot  Completed   Hepatitis C Screening: USPSTF Recommendation to screen - Ages 18-79 yo.  Completed   HPV Vaccine  Aged Out  *Topic was postponed. The date shown is not the original due date.    Advanced directives: Please bring a copy of your health care power of attorney and living will to the office to be added to your chart at your convenience.   Conditions/risks identified: Aim for 30 minutes of exercise or brisk walking, 6-8 glasses of water, and 5 servings of fruits and vegetables each day.   Next appointment: Follow up in one year for your annual wellness visit    Preventive Care 65 Years and Older, Female Preventive care refers to lifestyle choices and visits with your health care provider that can promote health and wellness. What does preventive care include? A yearly physical exam. This is also called an annual well check. Dental exams once or twice a year. Routine eye exams. Ask your health care provider how often you should have your eyes checked. Personal  lifestyle choices, including: Daily care of your teeth and gums. Regular physical activity. Eating a healthy diet. Avoiding tobacco and drug use. Limiting alcohol use. Practicing safe sex. Taking low-dose aspirin every day. Taking vitamin and mineral supplements as recommended by your health care provider. What happens during an annual well check? The services and screenings done by your health care provider during your annual well check will depend on your age, overall health, lifestyle risk factors, and family history of disease. Counseling  Your health care provider may ask you questions about your: Alcohol use. Tobacco use. Drug use. Emotional well-being. Home and relationship well-being. Sexual activity. Eating habits. History of falls. Memory and ability to understand (cognition). Work and work Statistician. Reproductive health. Screening  You may have the following tests or measurements: Height, weight, and BMI. Blood pressure. Lipid and cholesterol levels. These may be checked every 5 years, or more frequently if you are over 23 years old. Skin check. Lung cancer screening. You may have this screening every year starting at age 31 if you have a 30-pack-year history of smoking and currently smoke or have quit within the past 15 years. Fecal occult blood test (FOBT) of the stool. You may have this test every year starting at age 56. Flexible sigmoidoscopy or colonoscopy. You may have a  sigmoidoscopy every 5 years or a colonoscopy every 10 years starting at age 72. Hepatitis C blood test. Hepatitis B blood test. Sexually transmitted disease (STD) testing. Diabetes screening. This is done by checking your blood sugar (glucose) after you have not eaten for a while (fasting). You may have this done every 1-3 years. Bone density scan. This is done to screen for osteoporosis. You may have this done starting at age 41. Mammogram. This may be done every 1-2 years. Talk to your  health care provider about how often you should have regular mammograms. Talk with your health care provider about your test results, treatment options, and if necessary, the need for more tests. Vaccines  Your health care provider may recommend certain vaccines, such as: Influenza vaccine. This is recommended every year. Tetanus, diphtheria, and acellular pertussis (Tdap, Td) vaccine. You may need a Td booster every 10 years. Zoster vaccine. You may need this after age 50. Pneumococcal 13-valent conjugate (PCV13) vaccine. One dose is recommended after age 42. Pneumococcal polysaccharide (PPSV23) vaccine. One dose is recommended after age 73. Talk to your health care provider about which screenings and vaccines you need and how often you need them. This information is not intended to replace advice given to you by your health care provider. Make sure you discuss any questions you have with your health care provider. Document Released: 05/25/2015 Document Revised: 01/16/2016 Document Reviewed: 02/27/2015 Elsevier Interactive Patient Education  2017 [employer] Prevention in the Home Falls can cause injuries. They can happen to people of all ages. There are many things you can do to make your home safe and to help prevent falls. What can I do on the outside of my home? Regularly fix the edges of walkways and driveways and fix any cracks. Remove anything that might make you trip as you walk through a door, such as a raised step or threshold. Trim any bushes or trees on the path to your home. Use bright outdoor lighting. Clear any walking paths of anything that might make someone trip, such as rocks or tools. Regularly check to see if handrails are loose or broken. Make sure that both sides of any steps have handrails. Any raised decks and porches should have guardrails on the edges. Have any leaves, snow, or ice cleared regularly. Use sand or salt on walking paths during winter. Clean  up any spills in your garage right away. This includes oil or grease spills. What can I do in the bathroom? Use night lights. Install grab bars by the toilet and in the tub and shower. Do not use towel bars as grab bars. Use non-skid mats or decals in the tub or shower. If you need to sit down in the shower, use a plastic, non-slip stool. Keep the floor dry. Clean up any water that spills on the floor as soon as it happens. Remove soap buildup in the tub or shower regularly. Attach bath mats securely with double-sided non-slip rug tape. Do not have throw rugs and other things on the floor that can make you trip. What can I do in the bedroom? Use night lights. Make sure that you have a light by your bed that is easy to reach. Do not use any sheets or blankets that are too big for your bed. They should not hang down onto the floor. Have a firm chair that has side arms. You can use this for support while you get dressed. Do not have throw rugs and other  things on the floor that can make you trip. What can I do in the kitchen? Clean up any spills right away. Avoid walking on wet floors. Keep items that you use a lot in easy-to-reach places. If you need to reach something above you, use a strong step stool that has a grab bar. Keep electrical cords out of the way. Do not use floor polish or wax that makes floors slippery. If you must use wax, use non-skid floor wax. Do not have throw rugs and other things on the floor that can make you trip. What can I do with my stairs? Do not leave any items on the stairs. Make sure that there are handrails on both sides of the stairs and use them. Fix handrails that are broken or loose. Make sure that handrails are as long as the stairways. Check any carpeting to make sure that it is firmly attached to the stairs. Fix any carpet that is loose or worn. Avoid having throw rugs at the top or bottom of the stairs. If you do have throw rugs, attach them to the  floor with carpet tape. Make sure that you have a light switch at the top of the stairs and the bottom of the stairs. If you do not have them, ask someone to add them for you. What else can I do to help prevent falls? Wear shoes that: Do not have high heels. Have rubber bottoms. Are comfortable and fit you well. Are closed at the toe. Do not wear sandals. If you use a stepladder: Make sure that it is fully opened. Do not climb a closed stepladder. Make sure that both sides of the stepladder are locked into place. Ask someone to hold it for you, if possible. Clearly mark and make sure that you can see: Any grab bars or handrails. First and last steps. Where the edge of each step is. Use tools that help you move around (mobility aids) if they are needed. These include: Canes. Walkers. Scooters. Crutches. Turn on the lights when you go into a dark area. Replace any light bulbs as soon as they burn out. Set up your furniture so you have a clear path. Avoid moving your furniture around. If any of your floors are uneven, fix them. If there are any pets around you, be aware of where they are. Review your medicines with your doctor. Some medicines can make you feel dizzy. This can increase your chance of falling. Ask your doctor what other things that you can do to help prevent falls. This information is not intended to replace advice given to you by your health care provider. Make sure you discuss any questions you have with your health care provider. Document Released: 02/22/2009 Document Revised: 10/04/2015 Document Reviewed: 06/02/2014 Elsevier Interactive Patient Education  2017 [employer].

## 2022-04-14 ENCOUNTER — Ambulatory Visit: Payer: Medicare HMO | Admitting: Family

## 2022-04-24 ENCOUNTER — Encounter: Payer: Self-pay | Admitting: Family

## 2022-04-25 ENCOUNTER — Ambulatory Visit: Payer: Medicare HMO | Admitting: Family

## 2022-05-02 ENCOUNTER — Ambulatory Visit (INDEPENDENT_AMBULATORY_CARE_PROVIDER_SITE_OTHER): Payer: Medicare HMO | Admitting: Family

## 2022-05-02 ENCOUNTER — Encounter: Payer: Self-pay | Admitting: Family

## 2022-05-02 VITALS — BP 131/71 | HR 76 | Temp 98.2°F | Ht 62.0 in | Wt 126.0 lb

## 2022-05-02 DIAGNOSIS — M545 Low back pain, unspecified: Secondary | ICD-10-CM | POA: Diagnosis not present

## 2022-05-02 DIAGNOSIS — E441 Mild protein-calorie malnutrition: Secondary | ICD-10-CM

## 2022-05-02 DIAGNOSIS — E782 Mixed hyperlipidemia: Secondary | ICD-10-CM | POA: Diagnosis not present

## 2022-05-02 DIAGNOSIS — F331 Major depressive disorder, recurrent, moderate: Secondary | ICD-10-CM | POA: Diagnosis not present

## 2022-05-02 DIAGNOSIS — M810 Age-related osteoporosis without current pathological fracture: Secondary | ICD-10-CM

## 2022-05-02 DIAGNOSIS — I10 Essential (primary) hypertension: Secondary | ICD-10-CM

## 2022-05-02 DIAGNOSIS — Z23 Encounter for immunization: Secondary | ICD-10-CM

## 2022-05-02 DIAGNOSIS — M159 Polyosteoarthritis, unspecified: Secondary | ICD-10-CM

## 2022-05-02 MED ORDER — DICLOFENAC SODIUM 75 MG PO TBEC: 75.0000 mg | DELAYED_RELEASE_TABLET | Freq: Two times a day (BID) | ORAL | 2 refills | Status: DC

## 2022-05-02 NOTE — Patient Instructions (Signed)
Osteoarthritis  Osteoarthritis is a type of arthritis. It refers to joint pain or joint disease. Osteoarthritis affects tissue that covers the ends of bones in joints (cartilage). Cartilage acts as a cushion between the bones and helps them move smoothly. Osteoarthritis occurs when cartilage in the joints gets worn down. Osteoarthritis is sometimes called "wear and tear" arthritis. Osteoarthritis is the most common form of arthritis. It often occurs in older people. It is a condition that gets worse over time. The joints most often affected by this condition are in the fingers, toes, hips, knees, and spine, including the neck and lower back. What are the causes? This condition is caused by the wearing down of cartilage that covers the ends of bones. What increases the risk? The following factors may make you more likely to develop this condition: Being age 50 or older. Obesity. Overuse of joints. Past injury of a joint. Past surgery on a joint. Family history of osteoarthritis. What are the signs or symptoms? The main symptoms of this condition are pain, swelling, and stiffness in the joint. Other symptoms may include: An enlarged joint. More pain and further damage caused by small pieces of bone or cartilage that break off and float inside of the joint. Small deposits of bone (osteophytes) that grow on the edges of the joint. A grating or scraping feeling inside the joint when you move it. Popping or creaking sounds when you move. Difficulty walking or exercising. An inability to grip items, twist your hand(s), or control the movements of your hands and fingers. How is this diagnosed? This condition may be diagnosed based on: Your medical history. A physical exam. Your symptoms. X-rays of the affected joint(s). Blood tests to rule out other types of arthritis. How is this treated? There is no cure for this condition, but treatment can help control pain and improve joint function.  Treatment may include a combination of therapies, such as: Pain relief techniques, such as: Applying heat and cold to the joint. Massage. A form of talk therapy called cognitive behavioral therapy (CBT). This therapy helps you set goals and follow up on the changes that you make. Medicines for pain and inflammation. The medicines can be taken by mouth or applied to the skin. They include: NSAIDs, such as ibuprofen. Prescription medicines. Strong anti-inflammatory medicines (corticosteroids). Certain nutritional supplements. A prescribed exercise program. You may work with a physical therapist. Assistive devices, such as a brace, wrap, splint, specialized glove, or cane. A weight control plan. Surgery, such as: An osteotomy. This is done to reposition the bones and relieve pain or to remove loose pieces of bone and cartilage. Joint replacement surgery. You may need this surgery if you have advanced osteoarthritis. Follow these instructions at home: Activity Rest your affected joints as told by your health care provider. Exercise as told by your health care provider. He or she may recommend specific types of exercise, such as: Strengthening exercises. These are done to strengthen the muscles that support joints affected by arthritis. Aerobic activities. These are exercises, such as brisk walking or water aerobics, that increase your heart rate. Range-of-motion activities. These help your joints move more easily. Balance and agility exercises. Managing pain, stiffness, and swelling     If directed, apply heat to the affected area as often as told by your health care provider. Use the heat source that your health care provider recommends, such as a moist heat pack or a heating pad. If you have a removable assistive device, remove it   as told by your health care provider. Place a towel between your skin and the heat source. If your health care provider tells you to keep the assistive device  on while you apply heat, place a towel between the assistive device and the heat source. Leave the heat on for 20-30 minutes. Remove the heat if your skin turns bright red. This is especially important if you are unable to feel pain, heat, or cold. You may have a greater risk of getting burned. If directed, put ice on the affected area. To do this: If you have a removable assistive device, remove it as told by your health care provider. Put ice in a plastic bag. Place a towel between your skin and the bag. If your health care provider tells you to keep the assistive device on during icing, place a towel between the assistive device and the bag. Leave the ice on for 20 minutes, 2-3 times a day. Move your fingers or toes often to reduce stiffness and swelling. Raise (elevate) the injured area above the level of your heart while you are sitting or lying down. General instructions Take over-the-counter and prescription medicines only as told by your health care provider. Maintain a healthy weight. Follow instructions from your health care provider for weight control. Do not use any products that contain nicotine or tobacco, such as cigarettes, e-cigarettes, and chewing tobacco. If you need help quitting, ask your health care provider. Use assistive devices as told by your health care provider. Keep all follow-up visits as told by your health care provider. This is important. Where to find more information National Institute of Arthritis and Musculoskeletal and Skin Diseases: www.niams.nih.gov National Institute on Aging: www.nia.nih.gov American College of Rheumatology: www.rheumatology.org Contact a health care provider if: You have redness, swelling, or a feeling of warmth in a joint that gets worse. You have a fever along with joint or muscle aches. You develop a rash. You have trouble doing your normal activities. Get help right away if: You have pain that gets worse and is not relieved by  pain medicine. Summary Osteoarthritis is a type of arthritis that affects tissue covering the ends of bones in joints (cartilage). This condition is caused by the wearing down of cartilage that covers the ends of bones. The main symptom of this condition is pain, swelling, and stiffness in the joint. There is no cure for this condition, but treatment can help control pain and improve joint function. This information is not intended to replace advice given to you by your health care provider. Make sure you discuss any questions you have with your health care provider. Document Revised: 10/29/2021 Document Reviewed: 04/25/2019 Elsevier Patient Education  2023 Elsevier Inc.  

## 2022-05-02 NOTE — Progress Notes (Signed)
Subjective:    Patient ID: Kathryn Stein, female    DOB: 08-Sep-1945, 76 y.o.   MRN: [id number]  Chief Complaint  Patient presents with   Hypertension   Hip Pain    Right hip pain   PT presents to the office today for chronic follow up.  Her husband passed in March 2023 and has had trouble sleeping.    She has gained 18 lbs since our last visit. We started her on Remeron 15 mg for protein malnutrition.     05/02/2022   11:00 AM 04/07/2022    1:29 PM 03/28/2022   10:20 AM  Last 3 Weights  Weight (lbs) 126 lb 121 lb 121 lb  Weight (kg) 57.153 kg 54.885 kg 54.885 kg    She has osteoporosis. She stopped her Evista. Her last dexa scan 04/12/18.   Hypertension This is a chronic problem. The current episode started more than 1 year ago. The problem has been waxing and waning since onset. The problem is uncontrolled. Pertinent negatives include no malaise/fatigue, peripheral edema or shortness of breath. Risk factors for coronary artery disease include dyslipidemia. The current treatment provides moderate improvement. There is no history of heart failure.  Hip Pain  The incident occurred more than 1 week ago. There was no injury mechanism. The pain is present in the right hip. The pain is at a severity of 8/10. The pain is moderate. The pain has been Intermittent since onset. She reports no foreign bodies present. The symptoms are aggravated by weight bearing. She has tried NSAIDs for the symptoms. The treatment provided mild relief.  Hyperlipidemia This is a chronic problem. The current episode started more than 1 year ago. The problem is controlled. Recent lipid tests were reviewed and are normal. Pertinent negatives include no shortness of breath. Current antihyperlipidemic treatment includes statins. The current treatment provides moderate improvement of lipids. Risk factors for coronary artery disease include dyslipidemia, hypertension and a sedentary lifestyle.  Depression         This is a chronic problem.  The current episode started more than 1 year ago.   The onset quality is gradual.   Associated symptoms include no fatigue, no helplessness, no hopelessness and not sad. Back Pain This is a chronic problem. The current episode started more than 1 year ago. The problem occurs intermittently. The pain is present in the lumbar spine. The quality of the pain is described as aching. The pain is at a severity of 8/10. The pain is moderate. The treatment provided mild relief.      Review of Systems  Constitutional:  Negative for fatigue and malaise/fatigue.  Respiratory:  Negative for shortness of breath.   Musculoskeletal:  Positive for back pain.  Psychiatric/Behavioral:  Positive for depression.   All other systems reviewed and are negative.      Objective:   Physical Exam Vitals reviewed.  Constitutional:      General: She is not in acute distress.    Appearance: She is well-developed.  HENT:     Head: Normocephalic and atraumatic.     Right Ear: Tympanic membrane normal.     Left Ear: Tympanic membrane normal.  Eyes:     Pupils: Pupils are equal, round, and reactive to light.  Neck:     Thyroid: No thyromegaly.  Cardiovascular:     Rate and Rhythm: Normal rate and regular rhythm.     Heart sounds: Normal heart sounds. No murmur heard. Pulmonary:  Effort: Pulmonary effort is normal. No respiratory distress.     Breath sounds: Normal breath sounds. No wheezing.  Abdominal:     General: Bowel sounds are normal. There is no distension.     Palpations: Abdomen is soft.     Tenderness: There is no abdominal tenderness.  Musculoskeletal:        General: No tenderness. Normal range of motion.     Cervical back: Normal range of motion and neck supple.  Skin:    General: Skin is warm and dry.  Neurological:     Mental Status: She is alert and oriented to person, place, and time.     Cranial Nerves: No cranial nerve deficit.     Deep Tendon Reflexes:  Reflexes are normal and symmetric.  Psychiatric:        Behavior: Behavior normal.        Thought Content: Thought content normal.        Judgment: Judgment normal.       BP 131/71   Pulse 76   Temp 98.2 F (36.8 C) (Temporal)   Ht _0  (1.575 m)   Wt 126 lb (57.2 kg)   BMI 23.05 kg/m      Assessment & Plan:  Kathryn Stein comes in today with chief complaint of Hypertension and Hip Pain (Right hip pain)   Diagnosis and orders addressed:  1. Low back pain, unspecified back pain laterality, unspecified chronicity, unspecified whether sciatica present - CMP14+EGFR  2. Moderate episode of recurrent major depressive disorder (HCC) - CMP14+EGFR  3. Essential hypertension, benign - CMP14+EGFR  4. Mixed hyperlipidemia - CMP14+EGFR  5. Age-related osteoporosis without current pathological fracture - CMP14+EGFR  6. Mild protein-calorie malnutrition (HCC) - CMP14+EGFR  7. Primary osteoarthritis involving multiple joints Start diclofenac BID  No other NSAID's  - CMP14+EGFR - diclofenac (VOLTAREN) 75 MG EC tablet; Take 1 tablet (75 mg total) by mouth 2 (two) times daily.  Dispense: 60 tablet; Refill: 2   Labs pending Health Maintenance reviewed Diet and exercise encouraged  Follow up plan: 4 months    Evelina Dun, FNP

## 2022-05-02 NOTE — Addendum Note (Signed)
Addended by: Ladean Raya on: 05/02/2022 11:33 AM   Modules accepted: Orders

## 2022-05-02 NOTE — Progress Notes (Signed)
   Subjective:    Patient ID: Kathryn Stein, female    DOB: [birth year], 76 y.o.   MRN: [id number]  HPI    Review of Systems     Objective:   Physical Exam        Assessment & Plan:

## 2022-05-03 LAB — CMP14+EGFR
ALT: 20 IU/L (ref 0–32)
AST: 24 IU/L (ref 0–40)
Albumin/Globulin Ratio: 2 (ref 1.2–2.2)
Albumin: 4.3 g/dL (ref 3.8–4.8)
Alkaline Phosphatase: 78 IU/L (ref 44–121)
BUN/Creatinine Ratio: 25 (ref 12–28)
BUN: 20 mg/dL (ref 8–27)
Bilirubin Total: 0.4 mg/dL (ref 0.0–1.2)
CO2: 25 mmol/L (ref 20–29)
Calcium: 9.6 mg/dL (ref 8.7–10.3)
Chloride: 105 mmol/L (ref 96–106)
Creatinine, Ser: 0.8 mg/dL (ref 0.57–1.00)
Globulin, Total: 2.2 g/dL (ref 1.5–4.5)
Glucose: 72 mg/dL (ref 70–99)
Potassium: 4.3 mmol/L (ref 3.5–5.2)
Sodium: 144 mmol/L (ref 134–144)
Total Protein: 6.5 g/dL (ref 6.0–8.5)
eGFR: 76 mL/min/{1.73_m2} (ref >59)

## 2022-05-06 ENCOUNTER — Encounter: Payer: Self-pay | Admitting: Family

## 2022-06-02 ENCOUNTER — Other Ambulatory Visit: Payer: Self-pay

## 2022-06-02 ENCOUNTER — Emergency Department (HOSPITAL_COMMUNITY)
Admission: EM | Admit: 2022-06-02 | Discharge: 2022-06-03 | Disposition: A | Discharge status: Home / Self Care | Payer: Medicare HMO | Attending: Emergency Medicine | Admitting: Emergency Medicine

## 2022-06-02 ENCOUNTER — Encounter (HOSPITAL_COMMUNITY): Payer: Self-pay | Admitting: Emergency Medicine

## 2022-06-02 DIAGNOSIS — R42 Dizziness and giddiness: Secondary | ICD-10-CM | POA: Diagnosis not present

## 2022-06-02 DIAGNOSIS — R110 Nausea: Secondary | ICD-10-CM | POA: Diagnosis not present

## 2022-06-02 DIAGNOSIS — D649 Anemia, unspecified: Secondary | ICD-10-CM | POA: Insufficient documentation

## 2022-06-02 DIAGNOSIS — R109 Unspecified abdominal pain: Secondary | ICD-10-CM | POA: Diagnosis not present

## 2022-06-02 DIAGNOSIS — Z7982 Long term (current) use of aspirin: Secondary | ICD-10-CM | POA: Insufficient documentation

## 2022-06-02 DIAGNOSIS — Z79899 Other long term (current) drug therapy: Secondary | ICD-10-CM | POA: Insufficient documentation

## 2022-06-02 NOTE — ED Triage Notes (Signed)
Pt c/o dizziness all day with vomiting. Pt states she fell on her knees but denies any pain. Pt is ambulatory in triage.

## 2022-06-03 ENCOUNTER — Telehealth: Payer: Self-pay

## 2022-06-03 ENCOUNTER — Emergency Department (HOSPITAL_COMMUNITY): Payer: Medicare HMO

## 2022-06-03 LAB — COMPREHENSIVE METABOLIC PANEL
ALT: 23 U/L (ref 0–44)
AST: 28 U/L (ref 15–41)
Albumin: 4.5 g/dL (ref 3.5–5.0)
Alkaline Phosphatase: 76 U/L (ref 38–126)
Anion gap: 10 (ref 5–15)
BUN: 16 mg/dL (ref 8–23)
CO2: 26 mmol/L (ref 22–32)
Calcium: 9.3 mg/dL (ref 8.9–10.3)
Chloride: 101 mmol/L (ref 98–111)
Creatinine, Ser: 0.71 mg/dL (ref 0.44–1.00)
GFR, Estimated: >60 mL/min (ref >60)
Glucose, Bld: 113 mg/dL — ABNORMAL HIGH (ref 70–99)
Potassium: 3.8 mmol/L (ref 3.5–5.1)
Sodium: 137 mmol/L (ref 135–145)
Total Bilirubin: 0.5 mg/dL (ref 0.3–1.2)
Total Protein: 7.4 g/dL (ref 6.5–8.1)

## 2022-06-03 LAB — URINALYSIS, ROUTINE W REFLEX MICROSCOPIC
Bilirubin Urine: NEGATIVE
Glucose, UA: NEGATIVE mg/dL
Hgb urine dipstick: NEGATIVE
Ketones, ur: NEGATIVE mg/dL
Leukocytes,Ua: NEGATIVE
Nitrite: NEGATIVE
Protein, ur: NEGATIVE mg/dL
Specific Gravity, Urine: 1.009 (ref 1.005–1.030)
pH: 9 — ABNORMAL HIGH (ref 5.0–8.0)

## 2022-06-03 LAB — CBC
HCT: 35.9 % — ABNORMAL LOW (ref 36.0–46.0)
Hemoglobin: 11.6 g/dL — ABNORMAL LOW (ref 12.0–15.0)
MCH: 30.1 pg (ref 26.0–34.0)
MCHC: 32.3 g/dL (ref 30.0–36.0)
MCV: 93 fL (ref 80.0–100.0)
Platelets: 300 10*3/uL (ref 150–400)
RBC: 3.86 MIL/uL — ABNORMAL LOW (ref 3.87–5.11)
RDW: 14.9 % (ref 11.5–15.5)
WBC: 7.1 10*3/uL (ref 4.0–10.5)
nRBC: 0 % (ref 0.0–0.2)

## 2022-06-03 LAB — LIPASE, BLOOD: Lipase: 40 U/L (ref 11–51)

## 2022-06-03 MED ORDER — SODIUM CHLORIDE 0.9 % IV BOLUS
500.0000 mL | Freq: Once | INTRAVENOUS | Status: AC
  Administered 2022-06-03: 500 mL via INTRAVENOUS

## 2022-06-03 MED ORDER — MECLIZINE HCL 25 MG PO TABS: 12.5000 mg | ORAL_TABLET | Freq: Three times a day (TID) | ORAL | 0 refills | Status: DC | PRN

## 2022-06-03 MED ORDER — ONDANSETRON HCL 4 MG/2ML IJ SOLN
4.0000 mg | Freq: Once | INTRAMUSCULAR | Status: AC
  Administered 2022-06-03: 4 mg via INTRAVENOUS
  Filled 2022-06-03: qty 2

## 2022-06-03 MED ORDER — MECLIZINE HCL 12.5 MG PO TABS
12.5000 mg | ORAL_TABLET | Freq: Once | ORAL | Status: AC
  Administered 2022-06-03: 12.5 mg via ORAL
  Filled 2022-06-03: qty 1

## 2022-06-03 NOTE — Telephone Encounter (Signed)
Transition Care Management Unsuccessful Follow-up Telephone Call  Date of discharge and from where:  [facility] 06/03/2022  Attempts:  1st Attempt  Reason for unsuccessful TCM follow-up call:  Unable to leave message Juanda Crumble, [place] Direct Dial [phone]

## 2022-06-03 NOTE — ED Provider Notes (Signed)
[facility]  Provider Note  CSN: [id number] Arrival date & time: 06/02/22 2247  History Chief Complaint  Patient presents with   Dizziness    Kathryn Stein is a 77 y.o. female with remote history of vertigo reports she has been feeling nauseated and dizzy all day long, like the room is spinning. She was able to go to the grocery store earlier in the day but symptoms got worse this evening. She went to lie down and got more dizzy, feeling hot and nauseated. She rushed to the bathroom to vomit and fell down. Denies any injuries with the fall, but still feeling nauseated and dizzy. She denies any recent fever, URI, congestion, CP or SOB. No LOC.    Home Medications Prior to Admission medications   Medication Sig Start Date End Date Taking? Authorizing Provider  meclizine (ANTIVERT) 25 MG tablet Take 0.5 tablets (12.5 mg total) by mouth 3 (three) times daily as needed for dizziness. 06/03/22  Yes Truddie Hidden, MD  amLODipine (NORVASC) 10 MG tablet Take 1 tablet (10 mg total) by mouth daily. 03/28/22   Evelina Dun A, FNP  aspirin EC 81 MG tablet Take 81 mg by mouth daily as needed. For pain    [provider]  atorvastatin (LIPITOR) 20 MG tablet Take 1 tablet (20 mg total) by mouth daily. 12/09/21   Evelina Dun A, FNP  cholecalciferol (VITAMIN D3) 25 MCG (1000 UNIT) tablet Take 1,000 Units by mouth daily.    [provider]  citalopram (CELEXA) 40 MG tablet Take 1 tablet (40 mg total) by mouth daily. 12/09/21   Sharion Balloon, FNP  diclofenac (VOLTAREN) 75 MG EC tablet Take 1 tablet (75 mg total) by mouth 2 (two) times daily. 05/02/22   Sharion Balloon, FNP  mirtazapine (REMERON SOL-TAB) 15 MG disintegrating tablet Take 1 tablet (15 mg total) by mouth at bedtime. 12/09/21   Sharion Balloon, FNP     Allergies    Codeine, Darvocet [propoxyphene n-acetaminophen], and Propoxyphene   Review of Systems   Review of  Systems Please see HPI for pertinent positives and negatives  Physical Exam BP (!) 149/84   Pulse 68   Temp 97.8 F (36.6 C) (Oral)   Resp 20   Ht '5\' 2"'$  (1.575 m)   Wt 58.1 kg   SpO2 99%   BMI 23.41 kg/m   Physical Exam Vitals and nursing note reviewed.  Constitutional:      Appearance: Normal appearance.  HENT:     Head: Normocephalic and atraumatic.     Nose: Nose normal.     Mouth/Throat:     Mouth: Mucous membranes are moist.  Eyes:     Extraocular Movements: Extraocular movements intact.     Conjunctiva/sclera: Conjunctivae normal.     Comments: Mild nystagmus with left lateral gaze  Cardiovascular:     Rate and Rhythm: Normal rate.  Pulmonary:     Effort: Pulmonary effort is normal.     Breath sounds: Normal breath sounds.  Abdominal:     General: Abdomen is flat.     Palpations: Abdomen is soft.     Tenderness: There is abdominal tenderness (diffuse, mild). There is no guarding.  Musculoskeletal:        General: No swelling. Normal range of motion.     Cervical back: Neck supple.  Skin:    General: Skin is warm and dry.  Neurological:     General:  No focal deficit present.     Mental Status: She is alert and oriented to person, place, and time.     Cranial Nerves: No cranial nerve deficit.     Sensory: No sensory deficit.     Motor: No weakness.  Psychiatric:        Mood and Affect: Mood normal.     ED Results / Procedures / Treatments   EKG EKG Interpretation  Date/Time:  Monday June 02 2022 23:00:36 EST Ventricular Rate:  68 PR Interval:  190 QRS Duration: 68 QT Interval:  354 QTC Calculation: 376 R Axis:   75 Text Interpretation: Normal sinus rhythm Normal ECG When compared with ECG of 19-Mar-2019 10:28, PREVIOUS ECG IS PRESENT Rate faster Confirmed by Calvert Cantor [phone]) on 06/02/2022 11:05:11 PM  Procedures Procedures  Medications Ordered in the ED Medications  sodium chloride 0.9 % bolus 500 mL (0 mLs Intravenous Stopped  06/03/22 0100)  ondansetron (ZOFRAN) injection 4 mg (4 mg Intravenous Given 06/03/22 0020)  meclizine (ANTIVERT) tablet 12.5 mg (12.5 mg Oral Given 06/03/22 0020)    Initial Impression and Plan  Patient here with vertiginous dizziness and nausea/vomiting. She is feeling better in position of comfort in bed. Will check labs, CT head. Zofran/meclizine and IVF for symptom control.   ED Course   Clinical Course as of 06/03/22 0227  Tue Jun 03, 2022  0019 CBC with normal WBC, mild anemia.  [CS]  3546 UA is normal.  [CS]  0038 CMP, lipase are normal.  [CS]  0136 I personally viewed the images from radiology studies and agree with radiologist interpretation: CT without acute findings [CS]  0224 Patient feeling better, ambulates to the bathroom without difficulty and wants to go home. Low suspicion for central vertigo. Plan discharge with Rx for meclizine, cautioned regarding sedation. PCP follow up, RTED for worsening.  [CS]    Clinical Course User Index [CS] Truddie Hidden, MD     MDM Rules/Calculators/A&P Medical Decision Making Given presenting complaint, I considered that admission might be necessary. After review of results from ED lab and/or imaging studies, admission to the hospital is not indicated at this time.    Problems Addressed: Vertigo: acute illness or injury  Amount and/or Complexity of Data Reviewed Labs: ordered. Decision-making details documented in ED Course. Radiology: ordered and independent interpretation performed. Decision-making details documented in ED Course. ECG/medicine tests: ordered and independent interpretation performed. Decision-making details documented in ED Course.  Risk Prescription drug management. Decision regarding hospitalization.     Final Clinical Impression(s) / ED Diagnoses Final diagnoses:  Vertigo    Rx / DC Orders ED Discharge Orders          Ordered    meclizine (ANTIVERT) 25 MG tablet  3 times daily PRN         06/03/22 0225             Truddie Hidden, MD 06/03/22 [phone]

## 2022-06-03 NOTE — ED Notes (Signed)
Pt transported to CT

## 2022-06-03 NOTE — ED Notes (Signed)
Pt able to independently ambulate to and from restroom. Pt states that she is still feeling a little bit dizzy and uses walls and bars to steady herself

## 2022-06-04 NOTE — Telephone Encounter (Signed)
Transition Care Management Follow-up Telephone Call Date of discharge and from where: [facility] 06/03/2022 How have you been since you were released from the hospital? Still has vertigo Any questions or concerns? No  Items Reviewed: Did the pt receive and understand the discharge instructions provided? Yes  Medications obtained and verified? Yes  Other? No  Any new allergies since your discharge? No  Dietary orders reviewed? Yes Do you have support at home? Yes   Home Care and Equipment/Supplies: Were home health services ordered? no If so, what is the name of the agency? N/a  Has the agency set up a time to come to the patient's home? not applicable Were any new equipment or medical supplies ordered?  No What is the name of the medical supply agency? N/a Were you able to get the supplies/equipment? not applicable Do you have any questions related to the use of the equipment or supplies? No  Functional Questionnaire: (I = Independent and D = Dependent) ADLs: I  Bathing/Dressing- I  Meal Prep- I  Eating- I  Maintaining continence- I  Transferring/Ambulation- I  Managing Meds- I  Follow up appointments reviewed:  PCP Hospital f/u appt confirmed? Yes  Scheduled to see Evelina Dun on 06/17/2022 @ 3:10. [facility] f/u appt confirmed? No  Are transportation arrangements needed? No  If their condition worsens, is the pt aware to call PCP or go to the Emergency Dept.? Yes Was the patient provided with contact information for the PCP's office or ED? Yes Was to pt encouraged to call back with questions or concerns? Yes .Juanda Crumble, [place] Nurse Health Advisor Direct Dial [phone]

## 2022-06-17 ENCOUNTER — Encounter: Payer: Self-pay | Admitting: Family

## 2022-06-17 ENCOUNTER — Ambulatory Visit (INDEPENDENT_AMBULATORY_CARE_PROVIDER_SITE_OTHER): Payer: Medicare HMO | Admitting: Family

## 2022-06-17 VITALS — BP 133/76 | HR 77 | Temp 97.7°F | Ht 62.0 in | Wt 125.0 lb

## 2022-06-17 DIAGNOSIS — R42 Dizziness and giddiness: Secondary | ICD-10-CM

## 2022-06-17 DIAGNOSIS — Z09 Encounter for follow-up examination after completed treatment for conditions other than malignant neoplasm: Secondary | ICD-10-CM

## 2022-06-17 DIAGNOSIS — M25551 Pain in right hip: Secondary | ICD-10-CM

## 2022-06-17 MED ORDER — DICLOFENAC SODIUM 1 % EX GEL: 2.0000 g | Freq: Four times a day (QID) | CUTANEOUS | 2 refills | Status: DC

## 2022-06-17 NOTE — Progress Notes (Signed)
Subjective:    Patient ID: Kathryn Stein, female    DOB: 11-Feb-1946, 77 y.o.   MRN: [id number]  Chief Complaint  Patient presents with   Transitions Of Care    Could not take the meclizine because it caused fluid build up she only took for 2 days.   Hip Pain    Right side      Pt presents to the office today for hospital follow up. She went to the ED on 06/03/22 with dizziness. She was given Antivert, but caused edema. She stopped this. She reports her dizziness has resolved.   She is complaining of continued right hip pain.  Hip Pain  The incident occurred more than 1 week ago. There was no injury mechanism. The pain is present in the right hip. The quality of the pain is described as aching. The pain is at a severity of 8/10. The pain is mild. The pain has been Intermittent since onset. Nothing aggravates the symptoms. She has tried acetaminophen for the symptoms. The treatment provided mild relief.  Dizziness This is a recurrent problem. The current episode started 1 to 4 weeks ago. The problem has been resolved.      Review of Systems  Neurological:  Positive for dizziness.  All other systems reviewed and are negative.      Objective:   Physical Exam Vitals reviewed.  Constitutional:      General: She is not in acute distress.    Appearance: She is well-developed.  HENT:     Head: Normocephalic and atraumatic.  Eyes:     Pupils: Pupils are equal, round, and reactive to light.  Neck:     Thyroid: No thyromegaly.  Cardiovascular:     Rate and Rhythm: Normal rate and regular rhythm.     Heart sounds: Normal heart sounds. No murmur heard. Pulmonary:     Effort: Pulmonary effort is normal. No respiratory distress.     Breath sounds: Normal breath sounds. No wheezing.  Abdominal:     General: Bowel sounds are normal. There is no distension.     Palpations: Abdomen is soft.     Tenderness: There is no abdominal tenderness.  Musculoskeletal:        General: No  tenderness. Normal range of motion.     Cervical back: Normal range of motion and neck supple.     Comments: Pain in right hip with internal rotation  Skin:    General: Skin is warm and dry.  Neurological:     Mental Status: She is alert and oriented to person, place, and time.     Cranial Nerves: No cranial nerve deficit.     Deep Tendon Reflexes: Reflexes are normal and symmetric.  Psychiatric:        Behavior: Behavior normal.        Thought Content: Thought content normal.        Judgment: Judgment normal.     BP 133/76   Pulse 77   Temp 97.7 F (36.5 C) (Temporal)   Ht '5\' 2"'$  (1.575 m)   Wt 125 lb (56.7 kg)   SpO2 99%   BMI 22.86 kg/m        Assessment & Plan:  Kathryn Stein comes in today with chief complaint of Transitions Of Care (Could not take the meclizine because it caused fluid build up she only took for 2 days.) and Hip Pain (Right side )   Diagnosis and orders addressed:  1. Dizziness Improved  Avoid fast position changes  2. Pain of right hip Continue diclofenac BID No other NSAID's  ROM exercises  Follow up if symptoms worsen or do not improve  - diclofenac Sodium (VOLTAREN) 1 % GEL; Apply 2 g topically 4 (four) times daily.  Dispense: 350 g; Refill: 2  3. Hospital discharge follow-up Hospital notes reviewed    Evelina Dun, FNP

## 2022-06-17 NOTE — Patient Instructions (Signed)
Hip Pain The hip is the joint between the upper legs and the lower pelvis. The bones, cartilage, tendons, and muscles of your hip joint support your body and allow you to move around. Hip pain can range from a minor ache to severe pain in one or both of your hips. The pain may be felt on the inside of the hip joint near the groin, or on the outside near the buttocks and upper thigh. You may also have swelling or stiffness in your hip area. Follow these instructions at home: Managing pain, stiffness, and swelling     If directed, put ice on the painful area. To do this: Put ice in a plastic bag. Place a towel between your skin and the bag. Leave the ice on for 20 minutes, 2-3 times a day. If directed, apply heat to the affected area as often as told by your health care provider. Use the heat source that your health care provider recommends, such as a moist heat pack or a heating pad. Place a towel between your skin and the heat source. Leave the heat on for 20-30 minutes. Remove the heat if your skin turns bright red. This is especially important if you are unable to feel pain, heat, or cold. You may have a greater risk of getting burned. Activity Do exercises as told by your health care provider. Avoid activities that cause pain. General instructions  Take over-the-counter and prescription medicines only as told by your health care provider. Keep a journal of your symptoms. Write down: How often you have hip pain. The location of your pain. What the pain feels like. What makes the pain worse. Sleep with a pillow between your legs on your most comfortable side. Keep all follow-up visits as told by your health care provider. This is important. Contact a health care provider if: You cannot put weight on your leg. Your pain or swelling continues or gets worse after one week. It gets harder to walk. You have a fever. Get help right away if: You fall. You have a sudden increase in pain  and swelling in your hip. Your hip is red or swollen or very tender to touch. Summary Hip pain can range from a minor ache to severe pain in one or both of your hips. The pain may be felt on the inside of the hip joint near the groin, or on the outside near the buttocks and upper thigh. Avoid activities that cause pain. Write down how often you have hip pain, the location of the pain, what makes it worse, and what it feels like. This information is not intended to replace advice given to you by your health care provider. Make sure you discuss any questions you have with your health care provider. Document Revised: 09/13/2018 Document Reviewed: 09/13/2018 Elsevier Patient Education  2023 [employer]  

## 2022-07-01 ENCOUNTER — Other Ambulatory Visit: Payer: Self-pay | Admitting: *Deleted

## 2022-07-01 DIAGNOSIS — M25551 Pain in right hip: Secondary | ICD-10-CM

## 2022-08-07 ENCOUNTER — Other Ambulatory Visit: Payer: Self-pay | Admitting: Family

## 2022-08-07 DIAGNOSIS — E782 Mixed hyperlipidemia: Secondary | ICD-10-CM

## 2022-08-07 NOTE — Telephone Encounter (Signed)
[facility] NTBS in May for 6 mos FU, refill sent to pharmacy

## 2022-08-11 ENCOUNTER — Encounter: Payer: Self-pay | Admitting: Family

## 2022-08-11 NOTE — Telephone Encounter (Signed)
Letter sent.

## 2022-09-11 ENCOUNTER — Ambulatory Visit (INDEPENDENT_AMBULATORY_CARE_PROVIDER_SITE_OTHER): Payer: Medicare HMO | Admitting: Family

## 2022-09-11 ENCOUNTER — Telehealth: Payer: Self-pay | Admitting: Family

## 2022-09-11 ENCOUNTER — Encounter: Payer: Self-pay | Admitting: Family

## 2022-09-11 VITALS — BP 133/74 | HR 60 | Temp 97.2°F | Ht 62.0 in | Wt 131.0 lb

## 2022-09-11 DIAGNOSIS — M159 Polyosteoarthritis, unspecified: Secondary | ICD-10-CM

## 2022-09-11 DIAGNOSIS — M545 Low back pain, unspecified: Secondary | ICD-10-CM

## 2022-09-11 DIAGNOSIS — I10 Essential (primary) hypertension: Secondary | ICD-10-CM

## 2022-09-11 DIAGNOSIS — E441 Mild protein-calorie malnutrition: Secondary | ICD-10-CM

## 2022-09-11 DIAGNOSIS — E782 Mixed hyperlipidemia: Secondary | ICD-10-CM

## 2022-09-11 DIAGNOSIS — F331 Major depressive disorder, recurrent, moderate: Secondary | ICD-10-CM

## 2022-09-11 DIAGNOSIS — F5101 Primary insomnia: Secondary | ICD-10-CM

## 2022-09-11 DIAGNOSIS — Z23 Encounter for immunization: Secondary | ICD-10-CM

## 2022-09-11 DIAGNOSIS — M150 Primary generalized (osteo)arthritis: Secondary | ICD-10-CM

## 2022-09-11 DIAGNOSIS — E559 Vitamin D deficiency, unspecified: Secondary | ICD-10-CM

## 2022-09-11 DIAGNOSIS — M810 Age-related osteoporosis without current pathological fracture: Secondary | ICD-10-CM

## 2022-09-11 MED ORDER — MIRTAZAPINE 15 MG PO TBDP: 15.0000 mg | ORAL_TABLET | Freq: Every day | ORAL | 1 refills | Status: DC

## 2022-09-11 MED ORDER — CITALOPRAM HYDROBROMIDE 40 MG PO TABS: 40.0000 mg | ORAL_TABLET | Freq: Every day | ORAL | 1 refills | Status: DC

## 2022-09-11 MED ORDER — AMLODIPINE BESYLATE 10 MG PO TABS: 10.0000 mg | ORAL_TABLET | Freq: Every day | ORAL | 1 refills | Status: DC

## 2022-09-11 MED ORDER — ATORVASTATIN CALCIUM 20 MG PO TABS: 20.0000 mg | ORAL_TABLET | Freq: Every day | ORAL | 1 refills | Status: DC

## 2022-09-11 MED ORDER — DICLOFENAC SODIUM 75 MG PO TBEC: 75.0000 mg | DELAYED_RELEASE_TABLET | Freq: Two times a day (BID) | ORAL | 2 refills | Status: DC

## 2022-09-11 MED ORDER — MIRTAZAPINE 15 MG PO TABS: 15.0000 mg | ORAL_TABLET | Freq: Every day | ORAL | 1 refills | Status: DC

## 2022-09-11 NOTE — Patient Instructions (Signed)
Health Maintenance After Age 77 After age 77, you are at a higher risk for certain long-term diseases and infections as well as injuries from falls. Falls are a major cause of broken bones and head injuries in people who are older than age 77. Getting regular preventive care can help to keep you healthy and well. Preventive care includes getting regular testing and making lifestyle changes as recommended by your health care provider. Talk with your health care provider about: Which screenings and tests you should have. A screening is a test that checks for a disease when you have no symptoms. A diet and exercise plan that is right for you. What should I know about screenings and tests to prevent falls? Screening and testing are the best ways to find a health problem early. Early diagnosis and treatment give you the best chance of managing medical conditions that are common after age 77. Certain conditions and lifestyle choices may make you more likely to have a fall. Your health care provider may recommend: Regular vision checks. Poor vision and conditions such as cataracts can make you more likely to have a fall. If you wear glasses, make sure to get your prescription updated if your vision changes. Medicine review. Work with your health care provider to regularly review all of the medicines you are taking, including over-the-counter medicines. Ask your health care provider about any side effects that may make you more likely to have a fall. Tell your health care provider if any medicines that you take make you feel dizzy or sleepy. Strength and balance checks. Your health care provider may recommend certain tests to check your strength and balance while standing, walking, or changing positions. Foot health exam. Foot pain and numbness, as well as not wearing proper footwear, can make you more likely to have a fall. Screenings, including: Osteoporosis screening. Osteoporosis is a condition that causes  the bones to get weaker and break more easily. Blood pressure screening. Blood pressure changes and medicines to control blood pressure can make you feel dizzy. Depression screening. You may be more likely to have a fall if you have a fear of falling, feel depressed, or feel unable to do activities that you used to do. Alcohol use screening. Using too much alcohol can affect your balance and may make you more likely to have a fall. Follow these instructions at home: Lifestyle Do not drink alcohol if: Your health care provider tells you not to drink. If you drink alcohol: Limit how much you have to: 0-1 drink a day for women. 0-2 drinks a day for men. Know how much alcohol is in your drink. In the [place] one drink equals one 12 oz bottle of beer (355 mL), one 5 oz glass of wine (148 mL), or one 1 oz glass of hard liquor (44 mL). Do not use any products that contain nicotine or tobacco. These products include cigarettes, chewing tobacco, and vaping devices, such as e-cigarettes. If you need help quitting, ask your health care provider. Activity  Follow a regular exercise program to stay fit. This will help you maintain your balance. Ask your health care provider what types of exercise are appropriate for you. If you need a cane or walker, use it as recommended by your health care provider. Wear supportive shoes that have nonskid soles. Safety  Remove any tripping hazards, such as rugs, cords, and clutter. Install safety equipment such as grab bars in bathrooms and safety rails on stairs. Keep rooms and walkways   well-lit. General instructions Talk with your health care provider about your risks for falling. Tell your health care provider if: You fall. Be sure to tell your health care provider about all falls, even ones that seem minor. You feel dizzy, tiredness (fatigue), or off-balance. Take over-the-counter and prescription medicines only as told by your health care provider. These include  supplements. Eat a healthy diet and maintain a healthy weight. A healthy diet includes low-fat dairy products, low-fat (lean) meats, and fiber from whole grains, beans, and lots of fruits and vegetables. Stay current with your vaccines. Schedule regular health, dental, and eye exams. Summary Having a healthy lifestyle and getting preventive care can help to protect your health and wellness after age 77. Screening and testing are the best way to find a health problem early and help you avoid having a fall. Early diagnosis and treatment give you the best chance for managing medical conditions that are more common for people who are older than age 77. Falls are a major cause of broken bones and head injuries in people who are older than age 77. Take precautions to prevent a fall at home. Work with your health care provider to learn what changes you can make to improve your health and wellness and to prevent falls. This information is not intended to replace advice given to you by your health care provider. Make sure you discuss any questions you have with your health care provider. Document Revised: 09/17/2020 Document Reviewed: 09/17/2020 Elsevier Patient Education  2023 Elsevier Inc.  

## 2022-09-11 NOTE — Telephone Encounter (Signed)
Prescription sent to pharmacy.

## 2022-09-11 NOTE — Telephone Encounter (Signed)
[facility] pharmacy called to let PCP know that the disintegrating tablets for the Mirtazapine Rx is not covered by pts insurance. Should they fill regular tablets instead?

## 2022-09-11 NOTE — Addendum Note (Signed)
Addended by: Austin Miles F on: 09/11/2022 11:30 AM   Modules accepted: Orders

## 2022-09-11 NOTE — Progress Notes (Signed)
Subjective:    Patient ID: Kathryn Stein, female    DOB: 03/19/1946, 77 y.o.   MRN: [id number]  Chief Complaint  Patient presents with   Medical Management of Chronic Issues   Hip Pain    RIGHT HIP RUNS DOWN LEG    PT presents to the office today for chronic follow up.   Her husband passed in March 2023 and has had trouble sleeping. She lost 20lb+ while grieving.    She has gained 23 lbs since starting her on Remeron 15 mg for protein malnutrition.      09/11/2022   10:27 AM 06/17/2022    3:01 PM 06/02/2022   10:52 PM  Last 3 Weights  Weight (lbs) 131 lb 125 lb 128 lb  Weight (kg) 59.421 kg 56.7 kg 58.06 kg    She has osteoporosis. She stopped her Evista. Her last dexa scan 04/12/18.    Hip Pain  The incident occurred more than 1 week ago. The pain is present in the right hip. The pain is at a severity of 8/10. The pain is moderate. The symptoms are aggravated by weight bearing. She has tried NSAIDs for the symptoms. The treatment provided mild relief.  Hypertension This is a chronic problem. The current episode started more than 1 year ago. The problem has been waxing and waning since onset. The problem is uncontrolled. Associated symptoms include malaise/fatigue. Pertinent negatives include no peripheral edema or shortness of breath. Risk factors for coronary artery disease include dyslipidemia and sedentary lifestyle. The current treatment provides moderate improvement.  Hyperlipidemia This is a chronic problem. The current episode started more than 1 year ago. The problem is controlled. Recent lipid tests were reviewed and are normal. Pertinent negatives include no shortness of breath. Current antihyperlipidemic treatment includes statins. The current treatment provides moderate improvement of lipids. Risk factors for coronary artery disease include hypertension, dyslipidemia, a sedentary lifestyle and post-menopausal.  Depression        This is a chronic problem.  The current  episode started more than 1 year ago.   Associated symptoms include fatigue, helplessness, hopelessness and sad. Back Pain This is a chronic problem. The current episode started more than 1 year ago. The problem occurs intermittently. The problem has been waxing and waning since onset. The pain is present in the lumbar spine. The quality of the pain is described as aching. The pain is at a severity of 8/10. The pain is moderate.      Review of Systems  Constitutional:  Positive for fatigue and malaise/fatigue.  Respiratory:  Negative for shortness of breath.   Musculoskeletal:  Positive for back pain.  Psychiatric/Behavioral:  Positive for depression.   All other systems reviewed and are negative.      Objective:   Physical Exam Vitals reviewed.  Constitutional:      General: She is not in acute distress.    Appearance: She is well-developed.  HENT:     Head: Normocephalic and atraumatic.     Right Ear: Tympanic membrane normal.     Left Ear: Tympanic membrane normal.  Eyes:     Pupils: Pupils are equal, round, and reactive to light.  Neck:     Thyroid: No thyromegaly.  Cardiovascular:     Rate and Rhythm: Normal rate and regular rhythm.     Heart sounds: Normal heart sounds. No murmur heard. Pulmonary:     Effort: Pulmonary effort is normal. No respiratory distress.     Breath sounds:  Normal breath sounds. No wheezing.  Abdominal:     General: Bowel sounds are normal. There is no distension.     Palpations: Abdomen is soft.     Tenderness: There is no abdominal tenderness.  Musculoskeletal:        General: No tenderness. Normal range of motion.     Cervical back: Normal range of motion and neck supple.  Skin:    General: Skin is warm and dry.  Neurological:     Mental Status: She is alert and oriented to person, place, and time.     Cranial Nerves: No cranial nerve deficit.     Deep Tendon Reflexes: Reflexes are normal and symmetric.  Psychiatric:        Behavior:  Behavior normal.        Thought Content: Thought content normal.        Judgment: Judgment normal.       BP (!) 164/61   Pulse 60   Temp (!) 97.2 F (36.2 C) (Temporal)   Ht 5\' 2"  (1.575 m)   Wt 131 lb (59.4 kg)   SpO2 97%   BMI 23.96 kg/m      Assessment & Plan:  Kathryn Stein comes in today with chief complaint of Medical Management of Chronic Issues and Hip Pain (RIGHT HIP RUNS DOWN LEG )   Diagnosis and orders addressed:  1. Essential hypertension, benign PT has not taken medications this morning, at home BP 133/74  - amLODipine (NORVASC) 10 MG tablet; Take 1 tablet (10 mg total) by mouth daily.  Dispense: 90 tablet; Refill: 1 - CMP14+EGFR - CBC with Differential/Platelet  2. Mixed hyperlipidemia - atorvastatin (LIPITOR) 20 MG tablet; Take 1 tablet (20 mg total) by mouth daily.  Dispense: 90 tablet; Refill: 1 - CMP14+EGFR - CBC with Differential/Platelet  3. Moderate episode of recurrent major depressive disorder (HCC) - citalopram (CELEXA) 40 MG tablet; Take 1 tablet (40 mg total) by mouth daily.  Dispense: 90 tablet; Refill: 1 - mirtazapine (REMERON SOL-TAB) 15 MG disintegrating tablet; Take 1 tablet (15 mg total) by mouth at bedtime.  Dispense: 90 tablet; Refill: 1 - CMP14+EGFR - CBC with Differential/Platelet  4. Primary insomnia - mirtazapine (REMERON SOL-TAB) 15 MG disintegrating tablet; Take 1 tablet (15 mg total) by mouth at bedtime.  Dispense: 90 tablet; Refill: 1 - CMP14+EGFR - CBC with Differential/Platelet  5. Mild protein-calorie malnutrition (HCC) - mirtazapine (REMERON SOL-TAB) 15 MG disintegrating tablet; Take 1 tablet (15 mg total) by mouth at bedtime.  Dispense: 90 tablet; Refill: 1 - CMP14+EGFR - CBC with Differential/Platelet  6. Primary osteoarthritis involving multiple joints - diclofenac (VOLTAREN) 75 MG EC tablet; Take 1 tablet (75 mg total) by mouth 2 (two) times daily.  Dispense: 60 tablet; Refill: 2 - CMP14+EGFR - CBC with  Differential/Platelet  7. Low back pain, unspecified back pain laterality, unspecified chronicity, unspecified whether sciatica present - CMP14+EGFR - CBC with Differential/Platelet  8. Age-related osteoporosis without current pathological fracture - CMP14+EGFR - CBC with Differential/Platelet  9. Vitamin D deficiency - CMP14+EGFR - CBC with Differential/Platelet   Labs pending Health Maintenance reviewed Diet and exercise encouraged  Follow up plan: 4 months    Jannifer Rodney, FNP

## 2022-09-12 LAB — CMP14+EGFR
ALT: 12 IU/L (ref 0–32)
AST: 21 IU/L (ref 0–40)
Albumin/Globulin Ratio: 1.7 (ref 1.2–2.2)
Albumin: 4.5 g/dL (ref 3.8–4.8)
Alkaline Phosphatase: 97 IU/L (ref 44–121)
BUN/Creatinine Ratio: 18 (ref 12–28)
BUN: 14 mg/dL (ref 8–27)
Bilirubin Total: 0.5 mg/dL (ref 0.0–1.2)
CO2: 24 mmol/L (ref 20–29)
Calcium: 9.9 mg/dL (ref 8.7–10.3)
Chloride: 101 mmol/L (ref 96–106)
Creatinine, Ser: 0.79 mg/dL (ref 0.57–1.00)
Globulin, Total: 2.6 g/dL (ref 1.5–4.5)
Glucose: 72 mg/dL (ref 70–99)
Potassium: 3.9 mmol/L (ref 3.5–5.2)
Sodium: 142 mmol/L (ref 134–144)
Total Protein: 7.1 g/dL (ref 6.0–8.5)
eGFR: 77 mL/min/{1.73_m2} (ref >59)

## 2022-09-12 LAB — CBC WITH DIFFERENTIAL/PLATELET
Basophils Absolute: 0 10*3/uL (ref 0.0–0.2)
Basos: 1 %
EOS (ABSOLUTE): 0 10*3/uL (ref 0.0–0.4)
Eos: 1 %
Hematocrit: 38.7 % (ref 34.0–46.6)
Hemoglobin: 12.2 g/dL (ref 11.1–15.9)
Immature Grans (Abs): 0 10*3/uL (ref 0.0–0.1)
Immature Granulocytes: 0 %
Lymphocytes Absolute: 1.4 10*3/uL (ref 0.7–3.1)
Lymphs: 37 %
MCH: 29.6 pg (ref 26.6–33.0)
MCHC: 31.5 g/dL (ref 31.5–35.7)
MCV: 94 fL (ref 79–97)
Monocytes Absolute: 0.3 10*3/uL (ref 0.1–0.9)
Monocytes: 8 %
Neutrophils Absolute: 2 10*3/uL (ref 1.4–7.0)
Neutrophils: 53 %
Platelets: 337 10*3/uL (ref 150–450)
RBC: 4.12 x10E6/uL (ref 3.77–5.28)
RDW: 12.3 % (ref 11.7–15.4)
WBC: 3.8 10*3/uL (ref 3.4–10.8)

## 2023-01-13 ENCOUNTER — Ambulatory Visit: Payer: Medicare HMO | Admitting: Family

## 2023-01-15 ENCOUNTER — Encounter: Payer: Self-pay | Admitting: Family

## 2023-01-29 ENCOUNTER — Ambulatory Visit: Payer: Medicare HMO | Admitting: Family

## 2023-01-29 ENCOUNTER — Ambulatory Visit (INDEPENDENT_AMBULATORY_CARE_PROVIDER_SITE_OTHER): Payer: Medicare HMO

## 2023-01-29 ENCOUNTER — Encounter: Payer: Self-pay | Admitting: Family

## 2023-01-29 VITALS — BP 157/82 | HR 63 | Temp 97.6°F | Ht 62.0 in | Wt 136.4 lb

## 2023-01-29 DIAGNOSIS — R6884 Jaw pain: Secondary | ICD-10-CM

## 2023-01-29 DIAGNOSIS — E782 Mixed hyperlipidemia: Secondary | ICD-10-CM | POA: Diagnosis not present

## 2023-01-29 DIAGNOSIS — F331 Major depressive disorder, recurrent, moderate: Secondary | ICD-10-CM

## 2023-01-29 DIAGNOSIS — Z23 Encounter for immunization: Secondary | ICD-10-CM | POA: Diagnosis not present

## 2023-01-29 DIAGNOSIS — E559 Vitamin D deficiency, unspecified: Secondary | ICD-10-CM

## 2023-01-29 DIAGNOSIS — I10 Essential (primary) hypertension: Secondary | ICD-10-CM

## 2023-01-29 DIAGNOSIS — M810 Age-related osteoporosis without current pathological fracture: Secondary | ICD-10-CM

## 2023-01-29 NOTE — Progress Notes (Signed)
Subjective:    Patient ID: Kathryn Stein, female    DOB: 02/04/1946, 77 y.o.   MRN: [id number]  Chief Complaint  Patient presents with   Medical Management of Chronic Issues    PT presents to the office today for chronic follow up.   She has osteoporosis. She stopped her Evista. Her last dexa scan 04/12/18.   She is complaining of right jaw pain, popping that started three months ago.   Her husband passed in March 2023 and has had trouble sleeping. She lost 20lb+ while grieving.    She has gained 28 lbs since starting her on Remeron 15 mg for protein malnutrition.     01/29/2023   10:36 AM 09/11/2022   10:27 AM 06/17/2022    3:01 PM  Last 3 Weights  Weight (lbs) 136 lb 6.4 oz 131 lb 125 lb  Weight (kg) 61.871 kg 59.421 kg 56.7 kg     Hypertension This is a chronic problem. The current episode started more than 1 year ago. The problem has been waxing and waning since onset. The problem is uncontrolled. Pertinent negatives include no malaise/fatigue, peripheral edema or shortness of breath. Risk factors for coronary artery disease include dyslipidemia and sedentary lifestyle. The current treatment provides moderate improvement.  Hyperlipidemia This is a chronic problem. The current episode started more than 1 year ago. The problem is controlled. Recent lipid tests were reviewed and are normal. Pertinent negatives include no shortness of breath. Current antihyperlipidemic treatment includes statins. The current treatment provides moderate improvement of lipids. Risk factors for coronary artery disease include dyslipidemia, hypertension, a sedentary lifestyle and post-menopausal.  Depression        This is a chronic problem.  The current episode started more than 1 year ago.   The problem occurs intermittently.  Associated symptoms include sad.  Associated symptoms include no helplessness and no hopelessness.  Past treatments include SSRIs - Selective serotonin reuptake inhibitors. Back  Pain This is a chronic problem. The current episode started more than 1 year ago. The problem occurs intermittently. The problem has been waxing and waning since onset. The pain is present in the lumbar spine. The quality of the pain is described as aching. The pain is at a severity of 0/10. The patient is experiencing no pain. She has tried NSAIDs for the symptoms. The treatment provided moderate relief.      Review of Systems  Constitutional:  Negative for malaise/fatigue.  Respiratory:  Negative for shortness of breath.   Musculoskeletal:  Positive for back pain.  Psychiatric/Behavioral:  Positive for depression.   All other systems reviewed and are negative.      Objective:   Physical Exam Vitals reviewed.  Constitutional:      General: She is not in acute distress.    Appearance: She is well-developed.  HENT:     Head: Normocephalic and atraumatic.     Right Ear: Tympanic membrane normal.     Left Ear: Tympanic membrane normal.  Eyes:     Pupils: Pupils are equal, round, and reactive to light.  Neck:     Thyroid: No thyromegaly.  Cardiovascular:     Rate and Rhythm: Normal rate and regular rhythm.     Heart sounds: Normal heart sounds. No murmur heard. Pulmonary:     Effort: Pulmonary effort is normal. No respiratory distress.     Breath sounds: Normal breath sounds. No wheezing.  Abdominal:     General: Bowel sounds are normal. There is  no distension.     Palpations: Abdomen is soft.     Tenderness: There is no abdominal tenderness.  Musculoskeletal:        General: No tenderness. Normal range of motion.     Cervical back: Normal range of motion and neck supple.  Skin:    General: Skin is warm and dry.  Neurological:     Mental Status: She is alert and oriented to person, place, and time.     Cranial Nerves: No cranial nerve deficit.     Deep Tendon Reflexes: Reflexes are normal and symmetric.  Psychiatric:        Behavior: Behavior normal.        Thought  Content: Thought content normal.        Judgment: Judgment normal.       BP (!) 157/82   Pulse 63   Temp 97.6 F (36.4 C) (Temporal)   Ht 5\' 2"  (1.575 m)   Wt 136 lb 6.4 oz (61.9 kg)   SpO2 96%   BMI 24.95 kg/m      Assessment & Plan:   MARKIAH SOULSBY comes in today with chief complaint of Medical Management of Chronic Issues   Diagnosis and orders addressed:  1. Encounter for immunization - Flu Vaccine Trivalent High Dose (Fluad) - CMP14+EGFR  2. Essential hypertension, benign Not at goal today, she forgot her medication this AM. Will go home and take medication and call us with her BP - CMP14+EGFR  3. Moderate episode of recurrent major depressive disorder (HCC) - CMP14+EGFR  4. Mixed hyperlipidemia - CMP14+EGFR  5. Age-related osteoporosis without current pathological fracture - CMP14+EGFR  6. Vitamin D deficiency - CMP14+EGFR  7. Jaw pain X-ray pending  Continue voltaren BID with food No other NSAID's  - DG Mandible 4 Views; Future - CMP14+EGFR   Labs pending Health Maintenance reviewed Diet and exercise encouraged  Follow up plan: 3 months    Jannifer Rodney, FNP

## 2023-01-29 NOTE — Patient Instructions (Signed)
Hypertension, Adult High blood pressure (hypertension) is when the force of blood pumping through the arteries is too strong. The arteries are the blood vessels that carry blood from the heart throughout the body. Hypertension forces the heart to work harder to pump blood and may cause arteries to become narrow or stiff. Untreated or uncontrolled hypertension can lead to a heart attack, heart failure, a stroke, kidney disease, and other problems. A blood pressure reading consists of a higher number over a lower number. Ideally, your blood pressure should be below 120/80. The first ("top") number is called the systolic pressure. It is a measure of the pressure in your arteries as your heart beats. The second ("bottom") number is called the diastolic pressure. It is a measure of the pressure in your arteries as the heart relaxes. What are the causes? The exact cause of this condition is not known. There are some conditions that result in high blood pressure. What increases the risk? Certain factors may make you more likely to develop high blood pressure. Some of these risk factors are under your control, including: Smoking. Not getting enough exercise or physical activity. Being overweight. Having too much fat, sugar, calories, or salt (sodium) in your diet. Drinking too much alcohol. Other risk factors include: Having a personal history of heart disease, diabetes, high cholesterol, or kidney disease. Stress. Having a family history of high blood pressure and high cholesterol. Having obstructive sleep apnea. Age. The risk increases with age. What are the signs or symptoms? High blood pressure may not cause symptoms. Very high blood pressure (hypertensive crisis) may cause: Headache. Fast or irregular heartbeats (palpitations). Shortness of breath. Nosebleed. Nausea and vomiting. Vision changes. Severe chest pain, dizziness, and seizures. How is this diagnosed? This condition is diagnosed by  measuring your blood pressure while you are seated, with your arm resting on a flat surface, your legs uncrossed, and your feet flat on the floor. The cuff of the blood pressure monitor will be placed directly against the skin of your upper arm at the level of your heart. Blood pressure should be measured at least twice using the same arm. Certain conditions can cause a difference in blood pressure between your right and left arms. If you have a high blood pressure reading during one visit or you have normal blood pressure with other risk factors, you may be asked to: Return on a different day to have your blood pressure checked again. Monitor your blood pressure at home for 1 week or longer. If you are diagnosed with hypertension, you may have other blood or imaging tests to help your health care provider understand your overall risk for other conditions. How is this treated? This condition is treated by making healthy lifestyle changes, such as eating healthy foods, exercising more, and reducing your alcohol intake. You may be referred for counseling on a healthy diet and physical activity. Your health care provider may prescribe medicine if lifestyle changes are not enough to get your blood pressure under control and if: Your systolic blood pressure is above 130. Your diastolic blood pressure is above 80. Your personal target blood pressure may vary depending on your medical conditions, your age, and other factors. Follow these instructions at home: Eating and drinking  Eat a diet that is high in fiber and potassium, and low in sodium, added sugar, and fat. An example of this eating plan is called the DASH diet. DASH stands for Dietary Approaches to Stop Hypertension. To eat this way: Eat  plenty of fresh fruits and vegetables. Try to fill one half of your plate at each meal with fruits and vegetables. Eat whole grains, such as whole-wheat pasta, brown rice, or whole-grain bread. Fill about one  fourth of your plate with whole grains. Eat or drink low-fat dairy products, such as skim milk or low-fat yogurt. Avoid fatty cuts of meat, processed or cured meats, and poultry with skin. Fill about one fourth of your plate with lean proteins, such as fish, chicken without skin, beans, eggs, or tofu. Avoid pre-made and processed foods. These tend to be higher in sodium, added sugar, and fat. Reduce your daily sodium intake. Many people with hypertension should eat less than 1,500 mg of sodium a day. Do not drink alcohol if: Your health care provider tells you not to drink. You are pregnant, may be pregnant, or are planning to become pregnant. If you drink alcohol: Limit how much you have to: 0-1 drink a day for women. 0-2 drinks a day for men. Know how much alcohol is in your drink. In the [place] one drink equals one 12 oz bottle of beer (355 mL), one 5 oz glass of wine (148 mL), or one 1 oz glass of hard liquor (44 mL). Lifestyle  Work with your health care provider to maintain a healthy body weight or to lose weight. Ask what an ideal weight is for you. Get at least 30 minutes of exercise that causes your heart to beat faster (aerobic exercise) most days of the week. Activities may include walking, swimming, or biking. Include exercise to strengthen your muscles (resistance exercise), such as Pilates or lifting weights, as part of your weekly exercise routine. Try to do these types of exercises for 30 minutes at least 3 days a week. Do not use any products that contain nicotine or tobacco. These products include cigarettes, chewing tobacco, and vaping devices, such as e-cigarettes. If you need help quitting, ask your health care provider. Monitor your blood pressure at home as told by your health care provider. Keep all follow-up visits. This is important. Medicines Take over-the-counter and prescription medicines only as told by your health care provider. Follow directions carefully. Blood  pressure medicines must be taken as prescribed. Do not skip doses of blood pressure medicine. Doing this puts you at risk for problems and can make the medicine less effective. Ask your health care provider about side effects or reactions to medicines that you should watch for. Contact a health care provider if you: Think you are having a reaction to a medicine you are taking. Have headaches that keep coming back (recurring). Feel dizzy. Have swelling in your ankles. Have trouble with your vision. Get help right away if you: Develop a severe headache or confusion. Have unusual weakness or numbness. Feel faint. Have severe pain in your chest or abdomen. Vomit repeatedly. Have trouble breathing. These symptoms may be an emergency. Get help right away. Call 911. Do not wait to see if the symptoms will go away. Do not drive yourself to the hospital. Summary Hypertension is when the force of blood pumping through your arteries is too strong. If this condition is not controlled, it may put you at risk for serious complications. Your personal target blood pressure may vary depending on your medical conditions, your age, and other factors. For most people, a normal blood pressure is less than 120/80. Hypertension is treated with lifestyle changes, medicines, or a combination of both. Lifestyle changes include losing weight, eating a healthy,  low-sodium diet, exercising more, and limiting alcohol. This information is not intended to replace advice given to you by your health care provider. Make sure you discuss any questions you have with your health care provider. Document Revised: 03/05/2021 Document Reviewed: 03/05/2021 Elsevier Patient Education  2024 [employer].

## 2023-01-29 NOTE — Addendum Note (Signed)
Addended by: Jannifer Rodney A on: 01/29/2023 11:06 AM   Modules accepted: Level of Service

## 2023-01-29 NOTE — Addendum Note (Signed)
Addended by: Leda Min D on: 01/29/2023 11:20 AM   Modules accepted: Orders

## 2023-01-30 LAB — CMP14+EGFR
ALT: 12 IU/L (ref 0–32)
AST: 17 IU/L (ref 0–40)
Albumin: 4.3 g/dL (ref 3.8–4.8)
Alkaline Phosphatase: 92 IU/L (ref 44–121)
BUN/Creatinine Ratio: 17 (ref 12–28)
BUN: 16 mg/dL (ref 8–27)
Bilirubin Total: 0.5 mg/dL (ref 0.0–1.2)
CO2: 26 mmol/L (ref 20–29)
Calcium: 9.7 mg/dL (ref 8.7–10.3)
Chloride: 103 mmol/L (ref 96–106)
Creatinine, Ser: 0.94 mg/dL (ref 0.57–1.00)
Globulin, Total: 2.4 g/dL (ref 1.5–4.5)
Glucose: 59 mg/dL — ABNORMAL LOW (ref 70–99)
Potassium: 4.4 mmol/L (ref 3.5–5.2)
Sodium: 143 mmol/L (ref 134–144)
Total Protein: 6.7 g/dL (ref 6.0–8.5)
eGFR: 63 mL/min/{1.73_m2} (ref >59)

## 2023-04-13 ENCOUNTER — Other Ambulatory Visit: Payer: Self-pay | Admitting: Family

## 2023-04-15 ENCOUNTER — Ambulatory Visit (INDEPENDENT_AMBULATORY_CARE_PROVIDER_SITE_OTHER): Payer: Medicare HMO

## 2023-04-15 VITALS — Ht 62.0 in | Wt 136.0 lb

## 2023-04-15 DIAGNOSIS — Z0000 Encounter for general adult medical examination without abnormal findings: Secondary | ICD-10-CM

## 2023-04-15 NOTE — Patient Instructions (Signed)
Ms. Lumpkin , Thank you for taking time to come for your Medicare Wellness Visit. I appreciate your ongoing commitment to your health goals. Please review the following plan we discussed and let me know if I can assist you in the future.   Referrals/Orders/Follow-Ups/Clinician Recommendations: Aim for 30 minutes of exercise or brisk walking, 6-8 glasses of water, and 5 servings of fruits and vegetables each day.  This is a list of the screening recommended for you and due dates:  Health Maintenance  Topic Date Due   COVID-19 Vaccine (3 - 2023-24 season) 01/11/2023   Zoster (Shingles) Vaccine (2 of 2) 04/30/2023*   DEXA scan (bone density measurement)  03/14/2024   Medicare Annual Wellness Visit  04/14/2024   DTaP/Tdap/Td vaccine (5 - Td or Tdap) 05/02/2032   Pneumonia Vaccine  Completed   Flu Shot  Completed   Hepatitis C Screening  Completed   HPV Vaccine  Aged Out   Colon Cancer Screening  Discontinued  *Topic was postponed. The date shown is not the original due date.    Advanced directives: (ACP Link)Information on Advanced Care Planning can be found at [facility] of [place] Advance Health Care Directives Advance Health Care Directives ([email or web address]   Next Medicare Annual Wellness Visit scheduled for next year: Yes

## 2023-04-15 NOTE — Progress Notes (Signed)
Subjective:   Kathryn Stein is a 77 y.o. female who presents for Medicare Annual (Subsequent) preventive examination.  Visit Complete: Virtual I connected with  Judeth Porch on 04/15/23 by a audio enabled telemedicine application and verified that I am speaking with the correct person using two identifiers.  Patient Location: Home  Provider Location: Home Office  I discussed the limitations of evaluation and management by telemedicine. The patient expressed understanding and agreed to proceed.  Vital Signs: Because this visit was a virtual/telehealth visit, some criteria may be missing or patient reported. Any vitals not documented were not able to be obtained and vitals that have been documented are patient reported.  Cardiac Risk Factors include: advanced age (>54men, >16 women);dyslipidemia;hypertension;sedentary lifestyle     Objective:    Today's Vitals   04/15/23 1154  Weight: 136 lb (61.7 kg)  Height: 5\' 2"  (1.575 m)   Body mass index is 24.87 kg/m.     04/15/2023   12:36 PM 06/02/2022   10:53 PM 04/07/2022    1:35 PM 03/17/2019    2:06 PM 03/13/2019    9:11 PM 09/16/2018    3:47 PM 08/18/2017   10:56 AM  Advanced Directives  Does Patient Have a Medical Advance Directive? No No No No No No No  Would patient like information on creating a medical advance directive? Yes (MAU/Ambulatory/Procedural Areas - Information given) No - Patient declined No - Patient declined No - Patient declined  Yes (MAU/Ambulatory/Procedural Areas - Information given) No - Patient declined    Current Medications (verified) Outpatient Encounter Medications as of 04/15/2023  Medication Sig   amLODipine (NORVASC) 10 MG tablet Take 1 tablet (10 mg total) by mouth daily.   aspirin EC 81 MG tablet Take 81 mg by mouth daily as needed. For pain   atorvastatin (LIPITOR) 20 MG tablet Take 1 tablet (20 mg total) by mouth daily.   cholecalciferol (VITAMIN D3) 25 MCG (1000 UNIT) tablet Take 1,000 Units  by mouth daily.   citalopram (CELEXA) 40 MG tablet Take 1 tablet (40 mg total) by mouth daily.   diclofenac (VOLTAREN) 75 MG EC tablet Take 1 tablet (75 mg total) by mouth 2 (two) times daily.   diclofenac Sodium (VOLTAREN) 1 % GEL Apply 2 g topically 4 (four) times daily.   mirtazapine (REMERON) 15 MG tablet TAKE 1 TABLET BY MOUTH AT BEDTIME   No facility-administered encounter medications on file as of 04/15/2023.    Allergies (verified) Codeine, Darvocet [propoxyphene n-acetaminophen], and Propoxyphene   History: Past Medical History:  Diagnosis Date   Abnormal heart rate    after birth of 3rd child   Anemia    Anorexia    Anxiety    Arthritis    Colon polyps    Depression    Dyspepsia    Gastritis    found on EGD   GERD (gastroesophageal reflux disease)    Hyperlipidemia    Hypertension    Marital relationship problem    Osteoporosis    Postmenopausal    Vertigo    Vertigo due to previous cerebellar infarction    Past Surgical History:  Procedure Laterality Date   BREAST SURGERY Left    biopsy benign   CESAREAN SECTION     per pt/ no c-section/had 5 vaginal births   COLONOSCOPY     MOUTH SURGERY     bone extraction   TUBAL LIGATION     Family History  Problem Relation Age of Onset  Colon cancer Mother    Diabetes Mother    Cancer Mother    Heart disease Mother    Hypertension Mother    Stroke Mother    Cancer Brother        throat - smoker   Kidney disease Brother    Prostate cancer Brother    Heart disease Sister    Cancer Sister        breast   Diabetes Sister    Liver disease Brother    Diabetes Brother    Hypertension Brother    Drug abuse Brother    Stroke Father 73   Diabetes Sister    Heart disease Sister    Hypertension Sister    Diabetes Sister    Cancer Sister    Cancer Brother        prostate   Stomach cancer Other        niece   Kidney cancer Other        niece   Cancer Daughter        kidney   Alcohol abuse Brother     Early death Sister    Heart disease Sister    Hypertension Sister    Hypertension Sister    Diabetes Sister    Mental illness Daughter    Social History   Socioeconomic History   Marital status: Married    Spouse name: Not on file   Number of children: 5   Years of education: 6   Highest education level: 6th grade  Occupational History   Occupation: disability/ retired    Associate Professor: [employer]  Tobacco Use   Smoking status: Never   Smokeless tobacco: Never  Vaping Use   Vaping status: Never Used  Substance and Sexual Activity   Alcohol use: No   Drug use: No   Sexual activity: Yes  Other Topics Concern   Not on file  Social History Narrative   Not on file   Social Determinants of Health   Financial Resource Strain: Low Risk  (04/15/2023)   Overall Financial Resource Strain (CARDIA)    Difficulty of Paying Living Expenses: Not hard at all  Food Insecurity: No Food Insecurity (04/15/2023)   Hunger Vital Sign    Worried About Running Out of Food in the Last Year: Never true    Ran Out of Food in the Last Year: Never true  Transportation Needs: No Transportation Needs (04/15/2023)   PRAPARE - Administrator, Civil Service (Medical): No    Lack of Transportation (Non-Medical): No  Physical Activity: Insufficiently Active (04/15/2023)   Exercise Vital Sign    Days of Exercise per Week: 3 days    Minutes of Exercise per Session: 30 min  Stress: No Stress Concern Present (04/15/2023)   [employer] of Occupational Health - Occupational Stress Questionnaire    Feeling of Stress : Not at all  Social Connections: Moderately Isolated (04/15/2023)   Social Connection and Isolation Panel [NHANES]    Frequency of Communication with Friends and Family: More than three times a week    Frequency of Social Gatherings with Friends and Family: Three times a week    Attends Religious Services: More than 4 times per year    Active Member of Clubs or Organizations: No    Attends  Banker Meetings: Never    Marital Status: Widowed    Tobacco Counseling Counseling given: Not Answered   Clinical Intake:  Pre-visit preparation completed: Yes  Pain :  No/denies pain     Diabetes: No  How often do you need to have someone help you when you read instructions, pamphlets, or other written materials from your doctor or pharmacy?: 1 - Never  Interpreter Needed?: No  Information entered by :: Kandis Fantasia LPN   Activities of Daily Living    04/15/2023   12:35 PM  In your present state of health, do you have any difficulty performing the following activities:  Hearing? 0  Vision? 0  Difficulty concentrating or making decisions? 0  Walking or climbing stairs? 0  Dressing or bathing? 0  Doing errands, shopping? 0  Preparing Food and eating ? N  Using the Toilet? N  In the past six months, have you accidently leaked urine? N  Do you have problems with loss of bowel control? N  Managing your Medications? N  Managing your Finances? N  Housekeeping or managing your Housekeeping? N    Patient Care Team: Junie Spencer, FNP as PCP - General (Nurse Practitioner) Rachael Fee, MD as Attending Physician (Gastroenterology) Malissa Hippo, MD (Inactive) as Consulting Physician (Gastroenterology)  Indicate any recent Medical Services you may have received from other than [facility] providers in the past year (date may be approximate).     Assessment:   This is a routine wellness examination for [place].  Hearing/Vision screen Hearing Screening - Comments:: Denies hearing difficulties   Vision Screening - Comments:: Wears rx glasses - up to date with routine eye exams with [facility].     Goals Addressed             This Visit's Progress    Prevent falls        Depression Screen    04/15/2023   12:34 PM 01/29/2023   10:41 AM 09/11/2022   11:24 AM 09/11/2022   10:28 AM 06/17/2022    3:03 PM 05/02/2022   11:01 AM 04/07/2022    1:34 PM   PHQ 2/9 Scores  PHQ - 2 Score 0 0 0 0 0 0 0  PHQ- 9 Score  0   0 0 0    Fall Risk    04/15/2023   12:35 PM 01/29/2023   10:41 AM 06/17/2022    3:01 PM 05/02/2022   11:02 AM 04/07/2022    1:33 PM  Fall Risk   Falls in the past year? 0 0 0 0 0  Number falls in past yr: 0 0 0  0  Injury with Fall? 0 0 0  0  Risk for fall due to : No Fall Risks No Fall Risks History of fall(s)  No Fall Risks  Follow up Falls prevention discussed;Education provided;Falls evaluation completed    Falls prevention discussed    MEDICARE RISK AT HOME: Medicare Risk at Home Any stairs in or around the home?: No If so, are there any without handrails?: No Home free of loose throw rugs in walkways, pet beds, electrical cords, etc?: Yes Adequate lighting in your home to reduce risk of falls?: Yes Life alert?: No Use of a cane, walker or w/c?: No Grab bars in the bathroom?: Yes Shower chair or bench in shower?: No Elevated toilet seat or a handicapped toilet?: Yes  TIMED UP AND GO:  Was the test performed?  No    Cognitive Function:    08/18/2017   11:18 AM 09/21/2014   10:48 AM 09/21/2014    9:34 AM 07/21/2014   12:21 PM  MMSE - Mini Mental State Exam  Orientation to time 4  5 5   Orientation to Place 5  5 5   Registration 3  3 3   Attention/ Calculation 5  5 5   Recall 1  2 3   Language- name 2 objects 2  2 2   Language- repeat 1  1 1   Language- follow 3 step command 3  3 3   Language- read & follow direction 1 1  1   Write a sentence 1  1 1   Copy design 0 1  1  Total score 26   30        04/15/2023   12:36 PM 04/07/2022    1:35 PM 09/16/2018    3:48 PM  6CIT Screen  What Year? 0 points 0 points 0 points  What month? 0 points 0 points 0 points  What time? 0 points 0 points 0 points  Count back from 20 0 points 0 points 0 points  Months in reverse 2 points 0 points 0 points  Repeat phrase 2 points 0 points 0 points  Total Score 4 points 0 points 0 points    Immunizations Immunization History   Administered Date(s) Administered   DTaP 07/12/2010   Fluad Quad(high Dose 65+) 03/14/2022   Fluad Trivalent(High Dose 65+) 01/29/2023   Influenza Split 07/21/2006   Influenza, Seasonal, Injecte, Preservative Fre 03/04/2011, 02/23/2012   Influenza,inj,Quad PF,6+ Mos 04/05/2013, 02/15/2015   Influenza-Unspecified 02/23/2012, 04/05/2013, 07/21/2014, 02/15/2015   Moderna Sars-Covid-2 Vaccination 09/01/2019, 10/04/2019   Pneumococcal Conjugate-13 07/21/2014   Pneumococcal Polysaccharide-23 12/23/2011   Td 08/09/2010   Tdap 08/09/2010, 05/02/2022   Zoster Recombinant(Shingrix) 09/11/2022   Zoster, Live 11/22/2013    TDAP status: Up to date  Flu Vaccine status: Up to date  Pneumococcal vaccine status: Up to date  Covid-19 vaccine status: Information provided on how to obtain vaccines.   Qualifies for Shingles Vaccine? Yes   Zostavax completed Yes   Shingrix Completed?: Yes  Screening Tests Health Maintenance  Topic Date Due   COVID-19 Vaccine (3 - 2023-24 season) 01/11/2023   Zoster Vaccines- Shingrix (2 of 2) 04/30/2023 (Originally 11/06/2022)   DEXA SCAN  03/14/2024   Medicare Annual Wellness (AWV)  04/14/2024   DTaP/Tdap/Td (5 - Td or Tdap) 05/02/2032   Pneumonia Vaccine 54+ Years old  Completed   INFLUENZA VACCINE  Completed   Hepatitis C Screening  Completed   HPV VACCINES  Aged Out   Colonoscopy  Discontinued    Health Maintenance  Health Maintenance Due  Topic Date Due   COVID-19 Vaccine (3 - 2023-24 season) 01/11/2023    Colorectal cancer screening: No longer required.   Mammogram status: No longer required due to age and preference .  Bone Density status: Completed 03/14/22. Results reflect: Bone density results: OSTEOPOROSIS. Repeat every 2 years.  Lung Cancer Screening: (Low Dose CT Chest recommended if Age 49-80 years, 20 pack-year currently smoking OR have quit w/in 15years.) does not qualify.   Lung Cancer Screening Referral: n/a  Additional  Screening:  Hepatitis C Screening: does qualify; Completed 12/20/1  Vision Screening: Recommended annual ophthalmology exams for early detection of glaucoma and other disorders of the eye. Is the patient up to date with their annual eye exam?  Yes  Who is the provider or what is the name of the office in which the patient attends annual eye exams? [facility].  If pt is not established with a provider, would they like to be referred to a provider to establish care? No .   Dental Screening: Recommended annual  dental exams for proper oral hygiene  Community Resource Referral / Chronic Care Management: CRR required this visit?  No   CCM required this visit?  No     Plan:     I have personally reviewed and noted the following in the patient's chart:   Medical and social history Use of alcohol, tobacco or illicit drugs  Current medications and supplements including opioid prescriptions. Patient is not currently taking opioid prescriptions. Functional ability and status Nutritional status Physical activity Advanced directives List of other physicians Hospitalizations, surgeries, and ER visits in previous 12 months Vitals Screenings to include cognitive, depression, and falls Referrals and appointments  In addition, I have reviewed and discussed with patient certain preventive protocols, quality metrics, and best practice recommendations. A written personalized care plan for preventive services as well as general preventive health recommendations were provided to patient.     Kandis Fantasia [place], [place]   01/0/2725   After Visit Summary: (MyChart) Due to this being a telephonic visit, the after visit summary with patients personalized plan was offered to patient via MyChart   Nurse Notes: No concerns at this time

## 2023-04-20 NOTE — Telephone Encounter (Signed)
Copied from CRM [id number]. Topic: General - Other >> Apr 20, 2023  2:54 PM Prudencio Pair wrote: Reason for CRM: Freddie, with [facility], called in to f/u to report a PAD test that was administered to patient on 12/2. She stated this was a "in home health & well being assessment" and the results were abnormal where the left was borderline & the right was mild. She stated they will also contact pt to have pt to follow up to schedule an office visit to see provider. Patient & provider will also receive reports via mail per rep. Freddie stated if there are any further questions or concerns, please call [phone]. They are available Monday-Friday from 8AM-5PM CST.

## 2023-04-30 ENCOUNTER — Ambulatory Visit: Payer: Medicare HMO | Admitting: Family

## 2023-05-04 ENCOUNTER — Ambulatory Visit: Payer: Medicare HMO | Admitting: Family

## 2023-05-11 ENCOUNTER — Encounter: Payer: Self-pay | Admitting: Family

## 2023-05-23 ENCOUNTER — Other Ambulatory Visit: Payer: Self-pay | Admitting: Family

## 2023-11-03 ENCOUNTER — Encounter: Payer: Self-pay | Admitting: Family

## 2023-11-03 ENCOUNTER — Ambulatory Visit (INDEPENDENT_AMBULATORY_CARE_PROVIDER_SITE_OTHER): Admitting: Family

## 2023-11-03 VITALS — BP 158/90 | HR 65 | Temp 98.4°F | Ht 62.0 in | Wt 118.8 lb

## 2023-11-03 DIAGNOSIS — E782 Mixed hyperlipidemia: Secondary | ICD-10-CM | POA: Diagnosis not present

## 2023-11-03 DIAGNOSIS — Z0000 Encounter for general adult medical examination without abnormal findings: Secondary | ICD-10-CM | POA: Diagnosis not present

## 2023-11-03 DIAGNOSIS — F331 Major depressive disorder, recurrent, moderate: Secondary | ICD-10-CM | POA: Diagnosis not present

## 2023-11-03 DIAGNOSIS — R252 Cramp and spasm: Secondary | ICD-10-CM

## 2023-11-03 DIAGNOSIS — I10 Essential (primary) hypertension: Secondary | ICD-10-CM

## 2023-11-03 DIAGNOSIS — M810 Age-related osteoporosis without current pathological fracture: Secondary | ICD-10-CM

## 2023-11-03 DIAGNOSIS — E441 Mild protein-calorie malnutrition: Secondary | ICD-10-CM

## 2023-11-03 DIAGNOSIS — E559 Vitamin D deficiency, unspecified: Secondary | ICD-10-CM

## 2023-11-03 MED ORDER — AMLODIPINE BESYLATE 10 MG PO TABS: 10.0000 mg | ORAL_TABLET | Freq: Every day | ORAL | 1 refills | Status: DC

## 2023-11-03 MED ORDER — CITALOPRAM HYDROBROMIDE 20 MG PO TABS: 20.0000 mg | ORAL_TABLET | Freq: Every day | ORAL | 2 refills | Status: DC

## 2023-11-03 MED ORDER — BACLOFEN 10 MG PO TABS: 10.0000 mg | ORAL_TABLET | Freq: Three times a day (TID) | ORAL | 0 refills | Status: DC

## 2023-11-03 MED ORDER — DICLOFENAC SODIUM 1 % EX GEL: 2.0000 g | Freq: Four times a day (QID) | CUTANEOUS | 2 refills | Status: AC

## 2023-11-03 MED ORDER — ASPIRIN EC 81 MG PO TBEC: 81.0000 mg | DELAYED_RELEASE_TABLET | Freq: Every day | ORAL | 2 refills | Status: DC | PRN

## 2023-11-03 MED ORDER — MIRTAZAPINE 7.5 MG PO TABS: 7.5000 mg | ORAL_TABLET | Freq: Every day | ORAL | 1 refills | Status: AC

## 2023-11-03 NOTE — Progress Notes (Signed)
 Subjective:    Patient ID: Kathryn Stein, female    DOB: 1946-01-19, 78 y.o.   MRN: [id number]  Chief Complaint  Patient presents with   not eating   cramps at night in leg    Both legs    PT presents to the office today for CPE and chronic follow up.   She has osteoporosis. She stopped her Evista . Her last dexa scan 03/14/22.SABRA    Her husband passed in March 2023 and has had trouble sleeping. She lost 20lb+ while grieving. She has gained 28 lbs since starting her on Remeron  15 mg for protein malnutrition.  However, she has not taken her medications in 3 months and have lost 18 lb.     11/03/2023    1:36 PM 04/15/2023   11:54 AM 01/29/2023   10:36 AM  Last 3 Weights  Weight (lbs) 118 lb 12.8 oz 136 lb 136 lb 6.4 oz  Weight (kg) 53.887 kg 61.689 kg 61.871 kg    Complaining of intermittent cramping pain of bilateral legs that is worse at night.   Hypertension This is a chronic problem. The current episode started more than 1 year ago. The problem has been waxing and waning since onset. The problem is uncontrolled. Pertinent negatives include no malaise/fatigue, peripheral edema or shortness of breath. Risk factors for coronary artery disease include dyslipidemia and sedentary lifestyle. The current treatment provides moderate improvement.  Hyperlipidemia This is a chronic problem. The current episode started more than 1 year ago. The problem is controlled. Recent lipid tests were reviewed and are normal. Pertinent negatives include no shortness of breath. Current antihyperlipidemic treatment includes statins. The current treatment provides moderate improvement of lipids. Risk factors for coronary artery disease include dyslipidemia, hypertension, a sedentary lifestyle and post-menopausal.  Depression        This is a chronic problem.  The current episode started more than 1 year ago.   The problem occurs intermittently.  Associated symptoms include sad.  Associated symptoms include no  helplessness and no hopelessness.  Past treatments include nothing.     Review of Systems  Constitutional:  Negative for malaise/fatigue.  Respiratory:  Negative for shortness of breath.   All other systems reviewed and are negative.  Family History  Problem Relation Age of Onset   Colon cancer Mother    Diabetes Mother    Cancer Mother    Heart disease Mother    Hypertension Mother    Stroke Mother    Cancer Brother        throat - smoker   Kidney disease Brother    Prostate cancer Brother    Heart disease Sister    Cancer Sister        breast   Diabetes Sister    Liver disease Brother    Diabetes Brother    Hypertension Brother    Drug abuse Brother    Stroke Father 2   Diabetes Sister    Heart disease Sister    Hypertension Sister    Diabetes Sister    Cancer Sister    Cancer Brother        prostate   Stomach cancer Other        niece   Kidney cancer Other        niece   Cancer Daughter        kidney   Alcohol abuse Brother    Early death Sister    Heart disease Sister    Hypertension  Sister    Hypertension Sister    Diabetes Sister    Mental illness Daughter    Social History   Socioeconomic History   Marital status: Married    Spouse name: Not on file   Number of children: 5   Years of education: 6   Highest education level: 6th grade  Occupational History   Occupation: disability/ retired    Associate Professor: [employer]  Tobacco Use   Smoking status: Never   Smokeless tobacco: Never  Vaping Use   Vaping status: Never Used  Substance and Sexual Activity   Alcohol use: No   Drug use: No   Sexual activity: Yes  Other Topics Concern   Not on file  Social History Narrative   Not on file   Social Drivers of Health   Financial Resource Strain: Low Risk  (04/15/2023)   Overall Financial Resource Strain (CARDIA)    Difficulty of Paying Living Expenses: Not hard at all  Food Insecurity: No Food Insecurity (04/15/2023)   Hunger Vital Sign    Worried  About Running Out of Food in the Last Year: Never true    Ran Out of Food in the Last Year: Never true  Transportation Needs: No Transportation Needs (04/15/2023)   PRAPARE - Administrator, Civil Service (Medical): No    Lack of Transportation (Non-Medical): No  Physical Activity: Insufficiently Active (04/15/2023)   Exercise Vital Sign    Days of Exercise per Week: 3 days    Minutes of Exercise per Session: 30 min  Stress: No Stress Concern Present (04/15/2023)   [employer] of Occupational Health - Occupational Stress Questionnaire    Feeling of Stress : Not at all  Social Connections: Moderately Isolated (04/15/2023)   Social Connection and Isolation Panel    Frequency of Communication with Friends and Family: More than three times a week    Frequency of Social Gatherings with Friends and Family: Three times a week    Attends Religious Services: More than 4 times per year    Active Member of Clubs or Organizations: No    Attends Banker Meetings: Never    Marital Status: Widowed        Objective:   Physical Exam Vitals reviewed.  Constitutional:      General: She is not in acute distress.    Appearance: She is well-developed.  HENT:     Head: Normocephalic and atraumatic.     Right Ear: Tympanic membrane normal.     Left Ear: Tympanic membrane normal.   Eyes:     Pupils: Pupils are equal, round, and reactive to light.   Neck:     Thyroid : No thyromegaly.   Cardiovascular:     Rate and Rhythm: Normal rate and regular rhythm.     Heart sounds: Normal heart sounds. No murmur heard. Pulmonary:     Effort: Pulmonary effort is normal. No respiratory distress.     Breath sounds: Normal breath sounds. No wheezing.  Abdominal:     General: Bowel sounds are normal. There is no distension.     Palpations: Abdomen is soft.     Tenderness: There is no abdominal tenderness.   Musculoskeletal:        General: No tenderness. Normal range of motion.      Cervical back: Normal range of motion and neck supple.   Skin:    General: Skin is warm and dry.   Neurological:     Mental Status: She  is alert and oriented to person, place, and time.     Cranial Nerves: No cranial nerve deficit.     Deep Tendon Reflexes: Reflexes are normal and symmetric.   Psychiatric:        Behavior: Behavior normal.        Thought Content: Thought content normal.        Judgment: Judgment normal.       BP (!) 158/90   Pulse 65   Temp 98.4 F (36.9 C) (Temporal)   Ht 5' 2 (1.575 m)   Wt 118 lb 12.8 oz (53.9 kg)   SpO2 97%   BMI 21.73 kg/m      Assessment & Plan:   Kathryn Stein comes in today with chief complaint of not eating and cramps at night in leg (Both legs)   Diagnosis and orders addressed:  1. Annual physical exam (Primary) - CMP14+EGFR - CBC with Differential/Platelet - Lipid panel - Magnesium  2. Moderate episode of recurrent major depressive disorder (HCC) - citalopram  (CELEXA ) 20 MG tablet; Take 1 tablet (20 mg total) by mouth daily.  Dispense: 90 tablet; Refill: 2 - mirtazapine  (REMERON ) 7.5 MG tablet; Take 1 tablet (7.5 mg total) by mouth at bedtime.  Dispense: 90 tablet; Refill: 1 - CMP14+EGFR - CBC with Differential/Platelet  3. Age-related osteoporosis without current pathological fracture - CMP14+EGFR - CBC with Differential/Platelet  4. Mixed hyperlipidemia - aspirin EC 81 MG tablet; Take 1 tablet (81 mg total) by mouth daily as needed. For pain  Dispense: 90 tablet; Refill: 2 - CMP14+EGFR - CBC with Differential/Platelet - Lipid panel  5. Vitamin D  deficiency - CMP14+EGFR - CBC with Differential/Platelet - VITAMIN D  25 Hydroxy (Vit-D Deficiency, Fractures)  6. Essential hypertension, benign - amLODipine  (NORVASC ) 10 MG tablet; Take 1 tablet (10 mg total) by mouth daily.  Dispense: 90 tablet; Refill: 1 - CMP14+EGFR - CBC with Differential/Platelet  7. Mild protein-calorie malnutrition (HCC)  -  mirtazapine  (REMERON ) 7.5 MG tablet; Take 1 tablet (7.5 mg total) by mouth at bedtime.  Dispense: 90 tablet; Refill: 1 - CMP14+EGFR - CBC with Differential/Platelet  8. Leg cramps -Force fluids Healthy diet encouraged Baclofen as needed, sedation precautions  - CMP14+EGFR - CBC with Differential/Platelet - Magnesium - baclofen (LIORESAL) 10 MG tablet; Take 1 tablet (10 mg total) by mouth 3 (three) times daily.  Dispense: 30 each; Refill: 0   Labs pending Will restart medications, will only start Celexa  and Remeron  at half.  Health Maintenance reviewed Diet and exercise encouraged  Follow up plan: 1 month to recheck    Bari Learn, FNP

## 2023-11-03 NOTE — Patient Instructions (Signed)
 Leg Cramps: What They Mean Leg cramps happen when one or more muscles tighten and there's no control over it. They can happen during exercise or when you're resting. Leg cramps are painful and can last for a few seconds to minutes. They can also come back many times before stopping. Usually, leg cramps aren't caused by a serious medical problem. Often, the cause isn't known. Some common causes include: Problems with moving or not moving the body, like: Working your muscles too hard, such as during intense exercise. Doing the same motion over and over. Not warming up or stretching before playing sports or doing activities. Using the wrong technique or form when playing sports or doing activities. Staying in one position for a long time. Water or electrolyte balance issues, like: Not drinking enough fluids or being dehydrated. Getting sick from too much heat. Having low levels of minerals called electrolytes in your blood, like potassium and calcium . This can happen from: Pregnancy. Taking medicines that make you pee more, also called diuretic medicines. Not getting enough nutrients from your diet. Side effects of some medicines. Follow these instructions at home: Eating and drinking Eat and drink as told. Eat a healthy diet that includes plenty of nutrients to help your muscles work well. A healthy diet includes fruits and vegetables, lean protein, whole grains, and low-fat or nonfat dairy products. Drink enough fluids to keep your pee pale yellow. Drinking more water may help prevent cramps. Managing pain and muscle cramping     Massage, stretch, and relax the cramped muscle. Do this for several minutes at a time. Use ice or an ice pack as told. Place a towel between your skin and the ice. Leave the ice on for 20 minutes, 2-3 times a day. Use heat as told. Use the heat source that your provider recommends, such as a moist heat pack or a heating pad. Do this as often as told. Place a  towel between your skin and the heat source. Leave the heat on for 20-30 minutes. If your skin turns red, take off the ice or heat right away to prevent skin damage. The risk of damage is higher if you can't feel pain, heat, or cold. Take hot showers or baths to help relax tight muscles. General instructions If you're having a lot of leg cramps, avoid hard workouts for several days. Take supplements and medicines only as told. Contact a health care provider if: Your leg cramps get worse or happen more often. Your leg cramps don't get better over time. Your foot becomes cold, numb, or blue. This information is not intended to replace advice given to you by your health care provider. Make sure you discuss any questions you have with your health care provider. Document Revised: 04/17/2023 Document Reviewed: 01/07/2023 Elsevier Patient Education  2025 [employer].

## 2023-11-04 LAB — CBC WITH DIFFERENTIAL/PLATELET
Basophils Absolute: 0 10*3/uL (ref 0.0–0.2)
Basos: 1 %
EOS (ABSOLUTE): 0 10*3/uL (ref 0.0–0.4)
Eos: 1 %
Hematocrit: 39.6 % (ref 34.0–46.6)
Hemoglobin: 12.3 g/dL (ref 11.1–15.9)
Immature Grans (Abs): 0 10*3/uL (ref 0.0–0.1)
Immature Granulocytes: 0 %
Lymphocytes Absolute: 1.4 10*3/uL (ref 0.7–3.1)
Lymphs: 35 %
MCH: 29.9 pg (ref 26.6–33.0)
MCHC: 31.1 g/dL — ABNORMAL LOW (ref 31.5–35.7)
MCV: 96 fL (ref 79–97)
Monocytes Absolute: 0.3 10*3/uL (ref 0.1–0.9)
Monocytes: 8 %
Neutrophils Absolute: 2.2 10*3/uL (ref 1.4–7.0)
Neutrophils: 55 %
Platelets: 311 10*3/uL (ref 150–450)
RBC: 4.12 x10E6/uL (ref 3.77–5.28)
RDW: 13.2 % (ref 11.7–15.4)
WBC: 4 10*3/uL (ref 3.4–10.8)

## 2023-11-04 LAB — CMP14+EGFR
ALT: 13 IU/L (ref 0–32)
AST: 20 IU/L (ref 0–40)
Albumin: 4.5 g/dL (ref 3.8–4.8)
Alkaline Phosphatase: 78 IU/L (ref 44–121)
BUN/Creatinine Ratio: 16 (ref 12–28)
BUN: 13 mg/dL (ref 8–27)
Bilirubin Total: 0.6 mg/dL (ref 0.0–1.2)
CO2: 23 mmol/L (ref 20–29)
Calcium: 9.8 mg/dL (ref 8.7–10.3)
Chloride: 102 mmol/L (ref 96–106)
Creatinine, Ser: 0.79 mg/dL (ref 0.57–1.00)
Globulin, Total: 2.2 g/dL (ref 1.5–4.5)
Glucose: 92 mg/dL (ref 70–99)
Potassium: 4 mmol/L (ref 3.5–5.2)
Sodium: 139 mmol/L (ref 134–144)
Total Protein: 6.7 g/dL (ref 6.0–8.5)
eGFR: 77 mL/min/{1.73_m2} (ref >59)

## 2023-11-04 LAB — VITAMIN D 25 HYDROXY (VIT D DEFICIENCY, FRACTURES): Vit D, 25-Hydroxy: 35.8 ng/mL (ref 30.0–100.0)

## 2023-11-04 LAB — MAGNESIUM: Magnesium: 2.2 mg/dL (ref 1.6–2.3)

## 2023-11-04 LAB — LIPID PANEL
Chol/HDL Ratio: 2.8 ratio (ref 0.0–4.4)
Cholesterol, Total: 209 mg/dL — ABNORMAL HIGH (ref 100–199)
HDL: 74 mg/dL (ref >39)
LDL Chol Calc (NIH): 126 mg/dL — ABNORMAL HIGH (ref 0–99)
Triglycerides: 51 mg/dL (ref 0–149)
VLDL Cholesterol Cal: 9 mg/dL (ref 5–40)

## 2023-11-05 ENCOUNTER — Ambulatory Visit: Payer: Self-pay | Admitting: Family

## 2023-12-01 ENCOUNTER — Ambulatory Visit: Admitting: Family

## 2023-12-02 ENCOUNTER — Encounter: Payer: Self-pay | Admitting: Family

## 2023-12-07 ENCOUNTER — Ambulatory Visit: Payer: Self-pay

## 2023-12-07 NOTE — Telephone Encounter (Signed)
Noted  -LS

## 2023-12-07 NOTE — Telephone Encounter (Signed)
 FYI Only or Action Required?: FYI only for provider.  Patient was last seen in primary care on 11/03/2023 by Lavell Bari LABOR, FNP.  Called Nurse Triage reporting Altered Mental Status.  Symptoms began several months ago.  Interventions attempted: Nothing.  Symptoms are: gradually worsening.  Triage Disposition: See HCP Within 4 Hours (Or PCP Triage)  Patient/caregiver understands and will follow disposition?: Yes  **Patient scheduled for 7/31, as she missed her 7/22 appt**          Copied from CRM #[id number]. Topic: Clinical - Red Word Triage >> Dec 07, 2023  3:00 PM Donna BRAVO wrote: Red Word that prompted transfer to Nurse Triage:  Patient stating she has a hard time remembering things, and it's getting worse.   patient calling to schedule appt received letter in mail due to No Show on 12/01/23     ----------------------------------------------------------------------- From previous Reason for Contact - Scheduling: Patient/patient representative is calling to schedule an appointment. Refer to attachments for appointment information.  Patient received Reason for Disposition  [1] Longstanding confusion (e.g., dementia, stroke) AND [2] getting worse  Answer Assessment - Initial Assessment Questions 1. LEVEL OF CONSCIOUSNESS: How are they (the patient) acting right now? (e.g., alert-oriented, confused, lethargic, stuporous, comatose)     Alert oriented x 3   2. ONSET: When did the confusion start?  (e.g., minutes, hours, days)     Several months   3. PATTERN: Does this come and go, or has it been constant since it started?  Is it present now?     Intermittent    6. CAUSE: What do you think is causing the confusion?      Unknown   7. OTHER SYMPTOMS: Are there any other symptoms? (e.g., difficulty breathing, fever, headache, weakness)    No, reports losing balance more often in the past 3 months.   Patient stating she has a hard time remembering  things, and it's getting worse.  Protocols used: Confusion - Delirium-A-AH

## 2023-12-10 ENCOUNTER — Ambulatory Visit: Admitting: Family Medicine

## 2023-12-21 ENCOUNTER — Ambulatory Visit (INDEPENDENT_AMBULATORY_CARE_PROVIDER_SITE_OTHER): Admitting: Family

## 2023-12-21 ENCOUNTER — Encounter: Payer: Self-pay | Admitting: Family

## 2023-12-21 ENCOUNTER — Ambulatory Visit: Payer: Self-pay | Admitting: Family

## 2023-12-21 VITALS — BP 134/74 | HR 78 | Temp 97.5°F | Ht 62.0 in

## 2023-12-21 DIAGNOSIS — G300 Alzheimer's disease with early onset: Secondary | ICD-10-CM

## 2023-12-21 DIAGNOSIS — F02B18 Dementia in other diseases classified elsewhere, moderate, with other behavioral disturbance: Secondary | ICD-10-CM | POA: Diagnosis not present

## 2023-12-21 DIAGNOSIS — R4182 Altered mental status, unspecified: Secondary | ICD-10-CM | POA: Diagnosis not present

## 2023-12-21 LAB — URINALYSIS, COMPLETE
Bilirubin, UA: NEGATIVE
Glucose, UA: NEGATIVE
Ketones, UA: NEGATIVE
Leukocytes,UA: NEGATIVE
Nitrite, UA: NEGATIVE
Protein,UA: NEGATIVE
RBC, UA: NEGATIVE
Specific Gravity, UA: 1.015 (ref 1.005–1.030)
Urobilinogen, Ur: 0.2 mg/dL (ref 0.2–1.0)
pH, UA: 8.5 — ABNORMAL HIGH (ref 5.0–7.5)

## 2023-12-21 LAB — MICROSCOPIC EXAMINATION
RBC, Urine: NONE SEEN /HPF (ref 0–2)
Renal Epithel, UA: NONE SEEN /HPF
WBC, UA: NONE SEEN /HPF (ref 0–5)
Yeast, UA: NONE SEEN

## 2023-12-21 MED ORDER — MEMANTINE HCL 5 MG PO TABS: 5.0000 mg | ORAL_TABLET | Freq: Two times a day (BID) | ORAL | 1 refills | Status: DC

## 2023-12-21 MED ORDER — DONEPEZIL HCL 5 MG PO TABS
5.0000 mg | ORAL_TABLET | Freq: Every day | ORAL | 1 refills | Status: DC
Start: 2023-12-21 — End: 2024-01-26

## 2023-12-21 NOTE — Progress Notes (Signed)
 Subjective:    Patient ID: Kathryn Stein, female    DOB: 1945-11-11, 78 y.o.   MRN: [id number]  Chief Complaint  Patient presents with   Altered Mental Status     Been going on for 5 mth since she noticed leaves the stove on forgets days of the week     Altered Mental Status   Pt presents to the office today with changes in mental status that she noticed worsening over the last 5 months. States she will leave items cooking on her stove and leave the house. She has had to call her grandson to go check her house and he will find things smoking on the stove top that she left on.  Her daughter has moved in with her to help with things. However, she gets confused and thinks she is talking to her and then go look and she will not be there. She will also forget her husband passed away and start looking for him.   She states these moments come and go and then realize she isn't talking to anyone.   Denies any dysuria, fever, or SOB.    Review of Systems  All other systems reviewed and are negative.   Social History   Socioeconomic History   Marital status: Married    Spouse name: Not on file   Number of children: 5   Years of education: 6   Highest education level: 6th grade  Occupational History   Occupation: disability/ retired    Associate Professor: [employer]  Tobacco Use   Smoking status: Never   Smokeless tobacco: Never  Vaping Use   Vaping status: Never Used  Substance and Sexual Activity   Alcohol use: No   Drug use: No   Sexual activity: Yes  Other Topics Concern   Not on file  Social History Narrative   Not on file   Social Drivers of Health   Financial Resource Strain: Low Risk  (04/15/2023)   Overall Financial Resource Strain (CARDIA)    Difficulty of Paying Living Expenses: Not hard at all  Food Insecurity: No Food Insecurity (04/15/2023)   Hunger Vital Sign    Worried About Running Out of Food in the Last Year: Never true    Ran Out of Food in the Last Year: Never  true  Transportation Needs: No Transportation Needs (04/15/2023)   PRAPARE - Administrator, Civil Service (Medical): No    Lack of Transportation (Non-Medical): No  Physical Activity: Insufficiently Active (04/15/2023)   Exercise Vital Sign    Days of Exercise per Week: 3 days    Minutes of Exercise per Session: 30 min  Stress: No Stress Concern Present (04/15/2023)   [employer] of Occupational Health - Occupational Stress Questionnaire    Feeling of Stress : Not at all  Social Connections: Moderately Isolated (04/15/2023)   Social Connection and Isolation Panel    Frequency of Communication with Friends and Family: More than three times a week    Frequency of Social Gatherings with Friends and Family: Three times a week    Attends Religious Services: More than 4 times per year    Active Member of Clubs or Organizations: No    Attends Banker Meetings: Never    Marital Status: Widowed   Family History  Problem Relation Age of Onset   Colon cancer Mother    Diabetes Mother    Cancer Mother    Heart disease Mother    Hypertension  Mother    Stroke Mother    Cancer Brother        throat - smoker   Kidney disease Brother    Prostate cancer Brother    Heart disease Sister    Cancer Sister        breast   Diabetes Sister    Liver disease Brother    Diabetes Brother    Hypertension Brother    Drug abuse Brother    Stroke Father 50   Diabetes Sister    Heart disease Sister    Hypertension Sister    Diabetes Sister    Cancer Sister    Cancer Brother        prostate   Stomach cancer Other        niece   Kidney cancer Other        niece   Cancer Daughter        kidney   Alcohol abuse Brother    Early death Sister    Heart disease Sister    Hypertension Sister    Hypertension Sister    Diabetes Sister    Mental illness Daughter         Objective:   Physical Exam Vitals reviewed.  Constitutional:      General: She is not in acute  distress.    Appearance: She is well-developed.  HENT:     Head: Normocephalic and atraumatic.     Right Ear: Tympanic membrane normal.     Left Ear: Tympanic membrane normal.  Eyes:     Pupils: Pupils are equal, round, and reactive to light.  Neck:     Thyroid: No thyromegaly.  Cardiovascular:     Rate and Rhythm: Normal rate and regular rhythm.     Heart sounds: Normal heart sounds. No murmur heard. Pulmonary:     Effort: Pulmonary effort is normal. No respiratory distress.     Breath sounds: Normal breath sounds. No wheezing.  Abdominal:     General: Bowel sounds are normal. There is no distension.     Palpations: Abdomen is soft.     Tenderness: There is no abdominal tenderness.  Musculoskeletal:        General: No tenderness. Normal range of motion.     Cervical back: Normal range of motion and neck supple.  Skin:    General: Skin is warm and dry.  Neurological:     Mental Status: She is alert and oriented to person, place, and time.     Cranial Nerves: No cranial nerve deficit.     Deep Tendon Reflexes: Reflexes are normal and symmetric.  Psychiatric:        Behavior: Behavior normal.        Thought Content: Thought content normal.        Judgment: Judgment normal.       BP 134/74   Pulse 78   Temp (!) 97.5 F (36.4 C)   Ht 5' 2 (1.575 m)   BMI 21.73 kg/m      Assessment & Plan:  Kathryn Stein comes in today with chief complaint of Altered Mental Status ( Been going on for 5 mth since she noticed leaves the stove on forgets days of the week )   Diagnosis and orders addressed:  1. Altered mental status, unspecified altered mental status type (Primary) Labs pending to rule out any complications  - Urinalysis, Complete - Urine Culture; Future - Urine Culture - CMP14+EGFR; Future - CBC with Differential/Platelet; Future -  TSH; Future  2. 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  Dispense: 180 tablet; Refill: 1 - donepezil (ARICEPT) 5 MG tablet; Take 1 tablet (5 mg total) by mouth at bedtime.  Dispense: 90 tablet; Refill: 1   Labs pending Continue current medications    Return in about 2 months (around 02/20/2024), or if symptoms worsen or fail to improve.    Bari Learn, FNP

## 2023-12-21 NOTE — Patient Instructions (Signed)
 Dementia Dementia is a condition that affects the way the brain works. It often affects thinking and memory.  There are many types of dementia, including: Alzheimer's disease. This is the most common type. Vascular dementia. This type may happen due to a stroke. Lewy body dementia. This type may happen to people who have Parkinson's disease. Frontotemporal dementia. This type is caused by damage to nerve cells in certain parts of the brain. Some people may have more than one type. What are the causes? Dementia is caused by damage to cells in the brain. Some causes that can't be reversed include: Having a condition that affects the blood vessels of the brain. This may be diabetes or heart disease. Changes to genes. Some causes that can be reversed or slowed down include: Injury to the brain due to: A growth called a tumor. A blood clot. Too much fluid in the brain. Taking certain medicines. An infection. Problems with your thyroid . Not having enough vitamin B12 in the body. Having a disease that causes your body's defense system, called the immune system, to attack healthy parts of your body. What are the signs or symptoms? Symptoms of dementia start slowly and get worse with time. They may include: Problems remembering events or people. Getting lost easily. Forgetting appointments or to pay bills. Having trouble taking a bath or putting clothes on. Having trouble planning and making meals. Having trouble speaking. Changes in behavior or mood. How is this diagnosed? Dementia may be diagnosed based on: Your symptoms and medical history. A physical exam. Tests. These may include: Tests to check your thinking and memory to see how your brain is working. Lab tests. You may have tests on your blood or pee (urine). Imaging tests, such as a CT scan, a PET scan, or an MRI. Genetic testing. This may be done if other family members have had dementia. Your health care provider will talk  with you and your family, friends, or caregivers about your history and symptoms. How is this treated? Treatment depends on the cause of the dementia and should start as soon as possible. It might include: Taking medicines for symptoms. Taking medicines to help control or slow down the dementia. Treating the cause of your dementia. Your provider can help you find support groups and other members of the health care team who can help with your care. Follow these instructions at home: Medicines Take medicines only as told by your provider. Use a pill organizer or pill reminder to help you keep track of your medicines. Avoid taking medicines for pain or for sleep. These can affect your thinking. Lifestyle Make healthy choices. Be active as told by your provider. Do not smoke, vape, or use products with nicotine or tobacco in them. If you need help quitting, talk with your provider. Do not drink alcohol. When you feel a lot of stress, do something that helps you relax. Your provider can give you tips. Spend time with other people. Make sure you get good sleep at night. These tips can help: Try not to take naps during the day. Keep your bedroom dark and cool. Do not exercise in the few hours before you go to bed. Do not have foods or drinks with caffeine at night. Eating and drinking Drink enough fluid to keep your pee pale yellow. Eat a healthy diet. General instructions  Talk with your provider to decide on: What things you need help with. What your safety needs are. Ask your provider if it's safe for  you to drive. If told, wear a bracelet that tracks where you are or shows that you're a person with memory loss. Work with your family to make big legal or health decisions. This may include things like advance directives, medical power of attorney, or a living will. Where to find more information Alzheimer's Association: WesternTunes.it [employer] on Aging: BaseRingTones.pl World Health  Organization: VisitDestination.com.br Contact a health care provider if: You have any new symptoms. Your symptoms get worse. You have problems with swallowing. Get help right away if: You feel very sad or feel like you may hurt yourself or others. You have thoughts about taking your own life. Your family members are worried about your safety. These symptoms may be an emergency. Take one of these steps right away: Go to your nearest emergency room. Call 911. Call the National Suicide Prevention Lifeline at (817)539-9881 or 988. Text the Crisis Text Line at (669)660-2358. This information is not intended to replace advice given to you by your health care provider. Make sure you discuss any questions you have with your health care provider. Document Revised: 02/26/2023 Document Reviewed: 07/14/2022 Elsevier Patient Education  2024 [employer].

## 2023-12-22 ENCOUNTER — Other Ambulatory Visit

## 2023-12-22 DIAGNOSIS — R4182 Altered mental status, unspecified: Secondary | ICD-10-CM

## 2023-12-22 DIAGNOSIS — G300 Alzheimer's disease with early onset: Secondary | ICD-10-CM

## 2023-12-23 LAB — TSH: TSH: 3.28 u[IU]/mL (ref 0.450–4.500)

## 2023-12-23 LAB — CBC WITH DIFFERENTIAL/PLATELET
Basophils Absolute: 0 x10E3/uL (ref 0.0–0.2)
Basos: 1 %
EOS (ABSOLUTE): 0 x10E3/uL (ref 0.0–0.4)
Eos: 0 %
Hematocrit: 38.6 % (ref 34.0–46.6)
Hemoglobin: 11.9 g/dL (ref 11.1–15.9)
Immature Grans (Abs): 0 x10E3/uL (ref 0.0–0.1)
Immature Granulocytes: 0 %
Lymphocytes Absolute: 1.3 x10E3/uL (ref 0.7–3.1)
Lymphs: 32 %
MCH: 29.6 pg (ref 26.6–33.0)
MCHC: 30.8 g/dL — ABNORMAL LOW (ref 31.5–35.7)
MCV: 96 fL (ref 79–97)
Monocytes Absolute: 0.3 x10E3/uL (ref 0.1–0.9)
Monocytes: 8 %
Neutrophils Absolute: 2.4 x10E3/uL (ref 1.4–7.0)
Neutrophils: 59 %
Platelets: 385 x10E3/uL (ref 150–450)
RBC: 4.02 x10E6/uL (ref 3.77–5.28)
RDW: 13.5 % (ref 11.7–15.4)
WBC: 4.1 x10E3/uL (ref 3.4–10.8)

## 2023-12-23 LAB — CMP14+EGFR
ALT: 12 IU/L (ref 0–32)
AST: 15 IU/L (ref 0–40)
Albumin: 4.5 g/dL (ref 3.8–4.8)
Alkaline Phosphatase: 86 IU/L (ref 44–121)
BUN/Creatinine Ratio: 19 (ref 12–28)
BUN: 16 mg/dL (ref 8–27)
Bilirubin Total: 0.4 mg/dL (ref 0.0–1.2)
CO2: 24 mmol/L (ref 20–29)
Calcium: 9.8 mg/dL (ref 8.7–10.3)
Chloride: 100 mmol/L (ref 96–106)
Creatinine, Ser: 0.86 mg/dL (ref 0.57–1.00)
Globulin, Total: 2.2 g/dL (ref 1.5–4.5)
Glucose: 77 mg/dL (ref 70–99)
Potassium: 4.6 mmol/L (ref 3.5–5.2)
Sodium: 141 mmol/L (ref 134–144)
Total Protein: 6.7 g/dL (ref 6.0–8.5)
eGFR: 70 mL/min/1.73 (ref >59)

## 2023-12-23 LAB — URINE CULTURE: Organism ID, Bacteria: NO GROWTH

## 2024-01-26 ENCOUNTER — Ambulatory Visit: Admitting: Family

## 2024-01-26 ENCOUNTER — Encounter: Payer: Self-pay | Admitting: Family

## 2024-01-26 VITALS — BP 149/78 | HR 65 | Temp 97.9°F | Ht 62.0 in | Wt 123.2 lb

## 2024-01-26 DIAGNOSIS — I10 Essential (primary) hypertension: Secondary | ICD-10-CM

## 2024-01-26 DIAGNOSIS — G300 Alzheimer's disease with early onset: Secondary | ICD-10-CM

## 2024-01-26 DIAGNOSIS — Z23 Encounter for immunization: Secondary | ICD-10-CM

## 2024-01-26 DIAGNOSIS — F02B18 Dementia in other diseases classified elsewhere, moderate, with other behavioral disturbance: Secondary | ICD-10-CM

## 2024-01-26 DIAGNOSIS — F0280 Dementia in other diseases classified elsewhere without behavioral disturbance: Secondary | ICD-10-CM | POA: Insufficient documentation

## 2024-01-26 MED ORDER — MEMANTINE HCL 10 MG PO TABS: 10.0000 mg | ORAL_TABLET | Freq: Two times a day (BID) | ORAL | 1 refills | Status: DC

## 2024-01-26 MED ORDER — DONEPEZIL HCL 10 MG PO TABS: 10.0000 mg | ORAL_TABLET | Freq: Every day | ORAL | 2 refills | Status: DC

## 2024-01-26 NOTE — Patient Instructions (Signed)

## 2024-01-26 NOTE — Progress Notes (Signed)
 Subjective:    Patient ID: Kathryn Stein, female    DOB: 07/08/1945, 78 y.o.   MRN: [id number]  Chief Complaint  Patient presents with   1 MONTH RECHECK    ALTERED MENTAL STATUS   PT presents to the office today to recheck memory changes. She was seen 12/21/23 and started on Namenda  5 mg BID and Aricept  5 mg. Denies any any nausea and vomiting.   She is by herself today, but reports she has been able to tell a difference in her memory.   Her daughter is living with patient.   Her last visit she stated, she will leave items cooking on her stove and leave the house. She has done this one time since last month.  Her last visit her urine culture was negative, thyroid , CMP, and CBC was stable.  Hypertension This is a chronic problem. The current episode started more than 1 year ago. The problem has been waxing and waning since onset. The problem is uncontrolled. Pertinent negatives include no malaise/fatigue, peripheral edema or shortness of breath. Risk factors for coronary artery disease include dyslipidemia and sedentary lifestyle. Past treatments include calcium  channel blockers.      Review of Systems  Constitutional:  Negative for malaise/fatigue.  Respiratory:  Negative for shortness of breath.   All other systems reviewed and are negative.   Social History   Socioeconomic History   Marital status: Married    Spouse name: Not on file   Number of children: 5   Years of education: 6   Highest education level: 6th grade  Occupational History   Occupation: disability/ retired    Associate Professor: [employer]  Tobacco Use   Smoking status: Never   Smokeless tobacco: Never  Vaping Use   Vaping status: Never Used  Substance and Sexual Activity   Alcohol use: No   Drug use: No   Sexual activity: Yes  Other Topics Concern   Not on file  Social History Narrative   Not on file   Social Drivers of Health   Financial Resource Strain: Low Risk  (04/15/2023)   Overall Financial  Resource Strain (CARDIA)    Difficulty of Paying Living Expenses: Not hard at all  Food Insecurity: No Food Insecurity (04/15/2023)   Hunger Vital Sign    Worried About Running Out of Food in the Last Year: Never true    Ran Out of Food in the Last Year: Never true  Transportation Needs: No Transportation Needs (04/15/2023)   PRAPARE - Administrator, Civil Service (Medical): No    Lack of Transportation (Non-Medical): No  Physical Activity: Insufficiently Active (04/15/2023)   Exercise Vital Sign    Days of Exercise per Week: 3 days    Minutes of Exercise per Session: 30 min  Stress: No Stress Concern Present (04/15/2023)   [employer] of Occupational Health - Occupational Stress Questionnaire    Feeling of Stress : Not at all  Social Connections: Moderately Isolated (04/15/2023)   Social Connection and Isolation Panel    Frequency of Communication with Friends and Family: More than three times a week    Frequency of Social Gatherings with Friends and Family: Three times a week    Attends Religious Services: More than 4 times per year    Active Member of Clubs or Organizations: No    Attends Banker Meetings: Never    Marital Status: Widowed   Family History  Problem Relation Age of Onset  Colon cancer Mother    Diabetes Mother    Cancer Mother    Heart disease Mother    Hypertension Mother    Stroke Mother    Cancer Brother        throat - smoker   Kidney disease Brother    Prostate cancer Brother    Heart disease Sister    Cancer Sister        breast   Diabetes Sister    Liver disease Brother    Diabetes Brother    Hypertension Brother    Drug abuse Brother    Stroke Father 50   Diabetes Sister    Heart disease Sister    Hypertension Sister    Diabetes Sister    Cancer Sister    Cancer Brother        prostate   Stomach cancer Other        niece   Kidney cancer Other        niece   Cancer Daughter        kidney   Alcohol abuse  Brother    Early death Sister    Heart disease Sister    Hypertension Sister    Hypertension Sister    Diabetes Sister    Mental illness Daughter         Objective:   Physical Exam Vitals reviewed.  Constitutional:      General: She is not in acute distress.    Appearance: She is well-developed.  HENT:     Head: Normocephalic and atraumatic.  Eyes:     Pupils: Pupils are equal, round, and reactive to light.  Neck:     Thyroid : No thyromegaly.  Cardiovascular:     Rate and Rhythm: Normal rate and regular rhythm.     Heart sounds: Normal heart sounds. No murmur heard. Pulmonary:     Effort: Pulmonary effort is normal. No respiratory distress.     Breath sounds: Normal breath sounds. No wheezing.  Abdominal:     General: Bowel sounds are normal. There is no distension.     Palpations: Abdomen is soft.     Tenderness: There is no abdominal tenderness.  Musculoskeletal:        General: No tenderness. Normal range of motion.     Cervical back: Normal range of motion and neck supple.  Skin:    General: Skin is warm and dry.  Neurological:     Mental Status: She is alert and oriented to person, place, and time.     Cranial Nerves: No cranial nerve deficit.     Deep Tendon Reflexes: Reflexes are normal and symmetric.  Psychiatric:        Mood and Affect: Affect is flat.        Behavior: Behavior normal.        Thought Content: Thought content normal.        Judgment: Judgment normal.       BP (!) 149/78   Pulse 65   Temp 97.9 F (36.6 C)   Ht 5' 2 (1.575 m)   Wt 123 lb 3.2 oz (55.9 kg)   SpO2 97%   BMI 22.53 kg/m      Assessment & Plan:  Kathryn Stein comes in today with chief complaint of 1 MONTH RECHECK (ALTERED MENTAL STATUS)   Diagnosis and orders addressed:  1. Encounter for immunization (Primary) - Flu vaccine HIGH DOSE PF(Fluzone Trivalent)  2. Moderate early onset Alzheimer's dementia with other behavioral  disturbance (HCC) Will increase  Nameda to 10 BID from 5 mg BID Will increase Aricept  to 10 mg from 5 mg  Memory strategies discussed Strongly encouraged to not use stove unless daughter is helping - memantine  (NAMENDA ) 10 MG tablet; Take 1 tablet (10 mg total) by mouth 2 (two) times daily.  Dispense: 180 tablet; Refill: 1 - donepezil  (ARICEPT ) 10 MG tablet; Take 1 tablet (10 mg total) by mouth at bedtime.  Dispense: 90 tablet; Refill: 2  3. Essential hypertension, benign Continue medications    Labs pending Continue current medications  Keep follow up with specialists  Health Maintenance reviewed Diet and exercise encouraged  Return in about 3 months (around 04/26/2024), or if symptoms worsen or fail to improve.    Bari Learn, FNP

## 2024-01-31 ENCOUNTER — Other Ambulatory Visit: Payer: Self-pay | Admitting: Family

## 2024-01-31 DIAGNOSIS — M150 Primary generalized (osteo)arthritis: Secondary | ICD-10-CM

## 2024-02-01 DIAGNOSIS — R1010 Upper abdominal pain, unspecified: Secondary | ICD-10-CM | POA: Diagnosis not present

## 2024-02-01 DIAGNOSIS — Z7982 Long term (current) use of aspirin: Secondary | ICD-10-CM | POA: Diagnosis not present

## 2024-02-01 DIAGNOSIS — R1111 Vomiting without nausea: Secondary | ICD-10-CM | POA: Diagnosis not present

## 2024-02-01 DIAGNOSIS — Z9049 Acquired absence of other specified parts of digestive tract: Secondary | ICD-10-CM | POA: Diagnosis not present

## 2024-02-01 DIAGNOSIS — I10 Essential (primary) hypertension: Secondary | ICD-10-CM | POA: Diagnosis not present

## 2024-02-01 DIAGNOSIS — R531 Weakness: Secondary | ICD-10-CM | POA: Diagnosis not present

## 2024-02-01 DIAGNOSIS — R1013 Epigastric pain: Secondary | ICD-10-CM | POA: Diagnosis not present

## 2024-02-01 DIAGNOSIS — Z885 Allergy status to narcotic agent status: Secondary | ICD-10-CM | POA: Diagnosis not present

## 2024-02-01 DIAGNOSIS — Z79899 Other long term (current) drug therapy: Secondary | ICD-10-CM | POA: Diagnosis not present

## 2024-02-01 DIAGNOSIS — Z20822 Contact with and (suspected) exposure to covid-19: Secondary | ICD-10-CM | POA: Diagnosis not present

## 2024-02-01 DIAGNOSIS — R112 Nausea with vomiting, unspecified: Secondary | ICD-10-CM | POA: Diagnosis not present

## 2024-02-01 DIAGNOSIS — R110 Nausea: Secondary | ICD-10-CM | POA: Diagnosis not present

## 2024-02-01 DIAGNOSIS — K219 Gastro-esophageal reflux disease without esophagitis: Secondary | ICD-10-CM | POA: Diagnosis not present

## 2024-02-02 ENCOUNTER — Ambulatory Visit: Payer: Self-pay

## 2024-02-02 ENCOUNTER — Encounter: Payer: Self-pay | Admitting: Nurse Practitioner

## 2024-02-02 ENCOUNTER — Ambulatory Visit: Admitting: Nurse Practitioner

## 2024-02-02 VITALS — BP 158/77 | HR 63 | Temp 97.2°F | Ht 62.0 in | Wt 119.2 lb

## 2024-02-02 DIAGNOSIS — R1010 Upper abdominal pain, unspecified: Secondary | ICD-10-CM

## 2024-02-02 DIAGNOSIS — R112 Nausea with vomiting, unspecified: Secondary | ICD-10-CM | POA: Diagnosis not present

## 2024-02-02 DIAGNOSIS — G300 Alzheimer's disease with early onset: Secondary | ICD-10-CM

## 2024-02-02 DIAGNOSIS — F02B18 Dementia in other diseases classified elsewhere, moderate, with other behavioral disturbance: Secondary | ICD-10-CM

## 2024-02-02 DIAGNOSIS — K2900 Acute gastritis without bleeding: Secondary | ICD-10-CM

## 2024-02-02 DIAGNOSIS — Z09 Encounter for follow-up examination after completed treatment for conditions other than malignant neoplasm: Secondary | ICD-10-CM | POA: Insufficient documentation

## 2024-02-02 MED ORDER — ONDANSETRON HCL 4 MG PO TABS: 4.0000 mg | ORAL_TABLET | Freq: Three times a day (TID) | ORAL | 0 refills | Status: AC | PRN

## 2024-02-02 MED ORDER — PANTOPRAZOLE SODIUM 20 MG PO TBEC: 20.0000 mg | DELAYED_RELEASE_TABLET | Freq: Every day | ORAL | 0 refills | Status: DC

## 2024-02-02 NOTE — Progress Notes (Signed)
 Subjective:  Patient ID: Kathryn Stein, female    DOB: 05-04-1946, 78 y.o.   MRN: [id number]  Patient Care Team: Lavell Bari LABOR, FNP as PCP - General (Nurse Practitioner) Teressa Toribio SQUIBB, MD (Inactive) as Attending Physician (Gastroenterology) Golda Claudis PENNER, MD (Inactive) as Consulting Physician (Gastroenterology)   Chief Complaint:  Hospitalization Follow-up Orelia to hospital Sunday after taking newly prescribed aricept  and having nausea, vomiting, shaking. Has not taken medicine anymore symptoms are better just having fatigue )   HPI: Kathryn Stein is a 78 y.o. female presenting on 02/02/2024 for Hospitalization Follow-up (Went to hospital Sunday after taking newly prescribed aricept  and having nausea, vomiting, shaking. Has not taken medicine anymore symptoms are better just having fatigue )   Discussed the use of AI scribe software for clinical note transcription with the patient, who gave verbal consent to proceed.  History of Present Illness Kathryn Stein is a 78 year old female who presents alone with an nausea vomiting after started Donepezil  and was seen at the ED 02/01/2024  She was prescribed Donepezit 10 mg for nighttime use but initially forgot to pick it up. She took the medication for the first time on Sunday night, two hours before bed. Around 11 PM, she experienced severe nausea, vomiting, shaking, and jerking, prompting a call to the rescue squad and subsequent transport to the hospital. She stayed overnight at the hospital. Kathryn reports being scared when she was vomiting while sitting on the toilet  I thought I was going to die there, as my husband died in the bathroom and was praying to God to not letting die there just like my husband. She was tearful during the encounter while talking about it  I am scared that I will die in the bathroom. Reports poor appetite and most of the time take medications on empty stomach. Past medical history of GERD, and  currently not on PPI.  CT Abdomen/Pelvis with IV contrast (02/01/2024): No acute process identified. Organs including liver, pancreas, spleen, adrenals, kidneys, bowel, and pelvis are unremarkable. No obstruction, inflammation, or free fluid. Notable finding: aortic atherosclerosis without aneurysm or dissection.  She has not taken Aricept  since the incident and reports that the vomiting has resolved. She has not yet taken Namenda  since the adverse reaction. Explains to  [place] GI symptoms are common side of Donepezil  when first started    She was previously seen on December 21, 2023 by her PCP cognitive declined  including forgetting food on the stove while cooking and was started on Namenda  and Donepezil  at that time.  No vomiting since leaving the hospital. Reports that she didn't get any medication upon discharge.  She was alone at the visit and reports that her daughter was in her car, and very concerns about remembering visit instructions, plan to go outside and talk to her daughter.    past medical, surgical, family, and social history reviewed and updated as indicated.  Allergies and medications reviewed and updated. Data reviewed: Chart in Epic.   Past Medical History:  Diagnosis Date   Abnormal heart rate    after birth of 3rd child   Anemia    Anorexia    Anxiety    Arthritis    Colon polyps    Depression    Dyspepsia    Gastritis    found on EGD   GERD (gastroesophageal reflux disease)    Hyperlipidemia    Hypertension    Marital relationship problem  Osteoporosis    Postmenopausal    Vertigo    Vertigo due to previous cerebellar infarction     Past Surgical History:  Procedure Laterality Date   BREAST SURGERY Left    biopsy benign   CESAREAN SECTION     per pt/ no c-section/had 5 vaginal births   COLONOSCOPY     MOUTH SURGERY     bone extraction   TUBAL LIGATION      Social History   Socioeconomic History   Marital status: Married    Spouse name:  Not on file   Number of children: 5   Years of education: 6   Highest education level: 6th grade  Occupational History   Occupation: disability/ retired    Associate Professor: [employer]  Tobacco Use   Smoking status: Never   Smokeless tobacco: Never  Vaping Use   Vaping status: Never Used  Substance and Sexual Activity   Alcohol use: No   Drug use: No   Sexual activity: Yes  Other Topics Concern   Not on file  Social History Narrative   Not on file   Social Drivers of Health   Financial Resource Strain: Low Risk  (04/15/2023)   Overall Financial Resource Strain (CARDIA)    Difficulty of Paying Living Expenses: Not hard at all  Food Insecurity: No Food Insecurity (04/15/2023)   Hunger Vital Sign    Worried About Running Out of Food in the Last Year: Never true    Ran Out of Food in the Last Year: Never true  Transportation Needs: No Transportation Needs (04/15/2023)   PRAPARE - Administrator, Civil Service (Medical): No    Lack of Transportation (Non-Medical): No  Physical Activity: Insufficiently Active (04/15/2023)   Exercise Vital Sign    Days of Exercise per Week: 3 days    Minutes of Exercise per Session: 30 min  Stress: No Stress Concern Present (04/15/2023)   [employer] of Occupational Health - Occupational Stress Questionnaire    Feeling of Stress : Not at all  Social Connections: Moderately Isolated (04/15/2023)   Social Connection and Isolation Panel    Frequency of Communication with Friends and Family: More than three times a week    Frequency of Social Gatherings with Friends and Family: Three times a week    Attends Religious Services: More than 4 times per year    Active Member of Clubs or Organizations: No    Attends Banker Meetings: Never    Marital Status: Widowed  Intimate Partner Violence: Not At Risk (04/15/2023)   Humiliation, Afraid, Rape, and Kick questionnaire    Fear of Current or Ex-Partner: No    Emotionally Abused: No     Physically Abused: No    Sexually Abused: No    Outpatient Encounter Medications as of 02/02/2024  Medication Sig   amLODipine  (NORVASC ) 10 MG tablet Take 1 tablet (10 mg total) by mouth daily.   aspirin  EC 81 MG tablet Take 1 tablet (81 mg total) by mouth daily as needed. For pain   atorvastatin  (LIPITOR) 20 MG tablet Take 1 tablet (20 mg total) by mouth daily.   citalopram  (CELEXA ) 20 MG tablet Take 1 tablet (20 mg total) by mouth daily.   diclofenac  Sodium (VOLTAREN ) 1 % GEL Apply 2 g topically 4 (four) times daily.   memantine  (NAMENDA ) 10 MG tablet Take 1 tablet (10 mg total) by mouth 2 (two) times daily.   mirtazapine  (REMERON ) 7.5 MG tablet Take 1  tablet (7.5 mg total) by mouth at bedtime.   ondansetron  (ZOFRAN ) 4 MG tablet Take 1 tablet (4 mg total) by mouth every 8 (eight) hours as needed for nausea or vomiting.   pantoprazole  (PROTONIX ) 20 MG tablet Take 1 tablet (20 mg total) by mouth daily.   baclofen  (LIORESAL ) 10 MG tablet Take 1 tablet (10 mg total) by mouth 3 (three) times daily. (Patient not taking: Reported on 02/02/2024)   cholecalciferol (VITAMIN D3) 25 MCG (1000 UNIT) tablet Take 1,000 Units by mouth daily. (Patient not taking: Reported on 02/02/2024)   donepezil  (ARICEPT ) 10 MG tablet Take 1 tablet (10 mg total) by mouth at bedtime. (Patient not taking: Reported on 02/02/2024)   No facility-administered encounter medications on file as of 02/02/2024.    Allergies  Allergen Reactions   Codeine Nausea And Vomiting   Darvocet [Propoxyphene N-Acetaminophen]     Nausea and vomiting   Propoxyphene     Nausea and vomiting    Pertinent ROS per HPI, otherwise unremarkable      Objective:  BP (!) 158/77   Pulse 63   Temp (!) 97.2 F (36.2 C) (Temporal)   Ht 5' 2 (1.575 m)   Wt 119 lb 3.2 oz (54.1 kg)   SpO2 97%   BMI 21.80 kg/m    Wt Readings from Last 3 Encounters:  02/02/24 119 lb 3.2 oz (54.1 kg)  01/26/24 123 lb 3.2 oz (55.9 kg)  11/03/23 118 lb 12.8 oz  (53.9 kg)    Physical Exam Vitals and nursing note reviewed.  Constitutional:      General: She is not in acute distress. HENT:     Head: Normocephalic and atraumatic.     Right Ear: Tympanic membrane and ear canal normal. There is no impacted cerumen.     Left Ear: Tympanic membrane and external ear normal. There is no impacted cerumen.     Nose: Nose normal.  Eyes:     General: No scleral icterus.    Extraocular Movements: Extraocular movements intact.     Conjunctiva/sclera: Conjunctivae normal.     Pupils: Pupils are equal, round, and reactive to light.  Cardiovascular:     Heart sounds: Normal heart sounds.  Pulmonary:     Effort: Pulmonary effort is normal.     Breath sounds: Normal breath sounds.  Skin:    General: Skin is warm and dry.     Findings: No rash.  Neurological:     Mental Status: She is alert and oriented to person, place, and time. Mental status is at baseline.     Gait: Gait is intact.  Psychiatric:        Mood and Affect: Mood normal. Affect is tearful.        Speech: Speech normal.        Behavior: Behavior normal. Behavior is cooperative.        Thought Content: Thought content normal. Thought content does not include homicidal or suicidal ideation. Thought content does not include homicidal or suicidal plan.        Cognition and Memory: Cognition and memory normal.        Judgment: Judgment normal.    Physical Exam      Results for orders placed or performed in visit on 12/22/23  CBC with Differential/Platelet   Collection Time: 12/22/23  9:44 AM  Result Value Ref Range   WBC 4.1 3.4 - 10.8 x10E3/uL   RBC 4.02 3.77 - 5.28 x10E6/uL   Hemoglobin 11.9 11.1 -  15.9 g/dL   Hematocrit 61.3 65.9 - 46.6 %   MCV 96 79 - 97 fL   MCH 29.6 26.6 - 33.0 pg   MCHC 30.8 (L) 31.5 - 35.7 g/dL   RDW 86.4 88.2 - 84.5 %   Platelets 385 150 - 450 x10E3/uL   Neutrophils 59 Not Estab. %   Lymphs 32 Not Estab. %   Monocytes 8 Not Estab. %   Eos 0 Not Estab. %    Basos 1 Not Estab. %   Neutrophils Absolute 2.4 1.4 - 7.0 x10E3/uL   Lymphocytes Absolute 1.3 0.7 - 3.1 x10E3/uL   Monocytes Absolute 0.3 0.1 - 0.9 x10E3/uL   EOS (ABSOLUTE) 0.0 0.0 - 0.4 x10E3/uL   Basophils Absolute 0.0 0.0 - 0.2 x10E3/uL   Immature Granulocytes 0 Not Estab. %   Immature Grans (Abs) 0.0 0.0 - 0.1 x10E3/uL  CMP14+EGFR   Collection Time: 12/22/23  9:44 AM  Result Value Ref Range   Glucose 77 70 - 99 mg/dL   BUN 16 8 - 27 mg/dL   Creatinine, Ser 9.13 0.57 - 1.00 mg/dL   eGFR 70 >40 fO/fpw/8.26   BUN/Creatinine Ratio 19 12 - 28   Sodium 141 134 - 144 mmol/L   Potassium 4.6 3.5 - 5.2 mmol/L   Chloride 100 96 - 106 mmol/L   CO2 24 20 - 29 mmol/L   Calcium  9.8 8.7 - 10.3 mg/dL   Total Protein 6.7 6.0 - 8.5 g/dL   Albumin 4.5 3.8 - 4.8 g/dL   Globulin, Total 2.2 1.5 - 4.5 g/dL   Bilirubin Total 0.4 0.0 - 1.2 mg/dL   Alkaline Phosphatase 86 44 - 121 IU/L   AST 15 0 - 40 IU/L   ALT 12 0 - 32 IU/L  TSH   Collection Time: 12/22/23  9:44 AM  Result Value Ref Range   TSH 3.280 0.450 - 4.500 uIU/mL       Pertinent labs & imaging results that were available during my care of the patient were reviewed by me and considered in my medical decision making.  Assessment & Plan:  Kathryn Stein was seen today for hospitalization follow-up.  Diagnoses and all orders for this visit:  Moderate early onset Alzheimer's dementia with other behavioral disturbance (HCC)  Pain of upper abdomen  Acute gastritis without hemorrhage, unspecified gastritis type -     pantoprazole  (PROTONIX ) 20 MG tablet; Take 1 tablet (20 mg total) by mouth daily.  Nausea and vomiting, unspecified vomiting type -     ondansetron  (ZOFRAN ) 4 MG tablet; Take 1 tablet (4 mg total) by mouth every 8 (eight) hours as needed for nausea or vomiting.  Hospital discharge follow-up    Kathryn Stein is a 78 year old African-American female seen today posthospital discharge, no acute distress Assessment and  Plan Assessment & Plan Allergic reaction to donepezil  (Nasopil) Acute allergic reaction to donepezil  with nausea, vomiting while. - educate her on GI symptoms when first started Donepezil  Take Zofran  and 5-10 mins before taking it, if still experiencing N/V may need to be D/C . - start memantine  (Namenda ) as prescribed - Ensure adherence to prescribed dementia medications.  - went outside post visit to talk to her daughter about post visit instructions; advise to be I the room with her mother at her next f/u appointment d/t cognitive decline  Pantoprazole  20 mg daily and Zofran  4 mg every 8 hours as needed sent to her pharmacy    Continue all other maintenance medications.  Follow up plan:  Return for follow up with PCP as scheduled.   Continue healthy lifestyle choices, including diet (rich in fruits, vegetables, and lean proteins, and low in salt and simple carbohydrates) and exercise (at least 30 minutes of moderate physical activity daily).  Educational handout given for  Alzheimer's Disease Caregiver Guide Alzheimer's disease is a condition that makes a person: Forget things. Act differently. Have trouble paying attention and doing simple tasks. Have trouble talking and responding to your questions. These things get worse with time. The tips below can help you care for the person. How to help manage lifestyle changes Tips to help with symptoms Be calm and patient. Give simple, short answers to questions. Long answers can confuse the person. Avoid correcting the person in a negative way. Do not argue with the person. This may make the person more upset. Try not to take things personally, even if the person forgets your name. Tips to lessen frustration Use simple words and a calm voice. Only give one direction at a time. Do daily tasks, like bathing and dressing, when the person is at their best. Also make any appointments for these times. Take your time. Simple tasks may  take longer. Allow plenty of time to get tasks done. Limit choices for the person. Too many choices can be stressful. Involve the person in what you're doing. Keep things organized: Keep a daily routine. Organize medicines in a pillbox for each day of the week. Keep a calendar in a central place. Use it to remind the person of health care visits or other activities. Avoid new or crowded places, if possible. Buy clothes and shoes for the person that are easy to put on and take off. Try to change the subject if the person becomes frustrated or angry. This is a great way to make things less tense. Tips to prevent injury  Keep floors clear. Remove rugs, magazine racks, and floor lamps. Keep hallways well-lit, especially at night. Put a handrail and nonslip mat in the bathtub or shower. Put childproof locks on cabinets that have dangerous items in them. These items include medicine, alcohol, guns, cleaning products, and sharp tools. Put locks on doors where the person can't see or reach them. This helps keep the person from going out of the house and getting lost. Remove car keys and lock garage doors so the person doesn't try to drive. Be ready for emergencies. Keep a list of emergency phone numbers and addresses close by. Bracelets may be worn that track location and identify the person as having memory loss. This should be worn at all times for safety. Tips for the future  Discuss financial and legal planning early. Get help from a professional. People with this disease have trouble managing their money as the disease gets worse. Talk about advance directives, safety, and daily care. Take these steps: Create a living will and choose a power of attorney. This is someone who can make decisions for the person with Alzheimer's disease when they can no longer do so. Discuss driving safety and when to stop driving. The person's health care provider can help with this. If the person lives alone,  make sure they're safe. Some people need extra help at home. Other people need more care at a nursing home or care center. How to recognize changes in the person's condition With this disease, memory problems and confusion slowly get worse. In time, the person may not know their friends and family members. The disease can cause changes in  behavior and mood, such as anger or feeling worried or nervous. The person may also hallucinate. This means they see, hear, taste, smell, or feel things that aren't real. These changes can come on all of a sudden. They may happen in response to something such as: Pain. An infection. Changes in temperature. Too much stimulation or noise. Feeling lost or scared. Medicines. Where to find support Find out about services that can provide short-term care for the person. This is called respite care. It can allow you to take a break when you need one. Join a support group near you. These groups can help you: Learn ways to manage stress. Share experiences with others. Get emotional comfort and support. Learn about caregiving as the disease gets worse. Find community resources. Where to find more information Alzheimer's Association: WesternTunes.it Contact a health care provider if: The person has a fever. The person has a sudden change in behavior that doesn't get better when you try to help calm them. The person has a sudden increase in confusion or new hallucinations. The person is not able to take care of themselves at home. You are no longer able to care for the person. Get help right away if: You feel like the person may hurt themselves or others. The person has talked about taking their own life. These symptoms may be an emergency. Take one of these steps right away: Go to your nearest emergency room. Call 911. Call the National Suicide Prevention Lifeline at 202-233-9824 or 988. Text the Crisis Text Line at 5801876035. This information is not intended to  replace advice given to you by your health care provider. Make sure you discuss any questions you have with your health care provider. Document Revised: 01/29/2023 Document Reviewed: 06/26/2022 Elsevier Patient Education  2024 Elsevier Inc. Dementia Dementia is a condition that affects the way the brain works. It often affects thinking and memory.  There are many types of dementia, including: Alzheimer's disease. This is the most common type. Vascular dementia. This type may happen due to a stroke. Lewy body dementia. This type may happen to people who have Parkinson's disease. Frontotemporal dementia. This type is caused by damage to nerve cells in certain parts of the brain. Some people may have more than one type. What are the causes? Dementia is caused by damage to cells in the brain. Some causes that can't be reversed include: Having a condition that affects the blood vessels of the brain. This may be diabetes or heart disease. Changes to genes. Some causes that can be reversed or slowed down include: Injury to the brain due to: A growth called a tumor. A blood clot. Too much fluid in the brain. Taking certain medicines. An infection. Problems with your thyroid . Not having enough vitamin B12 in the body. Having a disease that causes your body's defense system, called the immune system, to attack healthy parts of your body. What are the signs or symptoms? Symptoms of dementia start slowly and get worse with time. They may include: Problems remembering events or people. Getting lost easily. Forgetting appointments or to pay bills. Having trouble taking a bath or putting clothes on. Having trouble planning and making meals. Having trouble speaking. Changes in behavior or mood. How is this diagnosed? Dementia may be diagnosed based on: Your symptoms and medical history. A physical exam. Tests. These may include: Tests to check your thinking and memory to see how your brain is  working. Lab tests. You may have tests on your  blood or pee (urine). Imaging tests, such as a CT scan, a PET scan, or an MRI. Genetic testing. This may be done if other family members have had dementia. Your health care provider will talk with you and your family, friends, or caregivers about your history and symptoms. How is this treated? Treatment depends on the cause of the dementia and should start as soon as possible. It might include: Taking medicines for symptoms. Taking medicines to help control or slow down the dementia. Treating the cause of your dementia. Your provider can help you find support groups and other members of the health care team who can help with your care. Follow these instructions at home: Medicines Take medicines only as told by your provider. Use a pill organizer or pill reminder to help you keep track of your medicines. Avoid taking medicines for pain or for sleep. These can affect your thinking. Lifestyle Make healthy choices. Be active as told by your provider. Do not smoke, vape, or use products with nicotine or tobacco in them. If you need help quitting, talk with your provider. Do not drink alcohol. When you feel a lot of stress, do something that helps you relax. Your provider can give you tips. Spend time with other people. Make sure you get good sleep at night. These tips can help: Try not to take naps during the day. Keep your bedroom dark and cool. Do not exercise in the few hours before you go to bed. Do not have foods or drinks with caffeine at night. Eating and drinking Drink enough fluid to keep your pee pale yellow. Eat a healthy diet. General instructions  Talk with your provider to decide on: What things you need help with. What your safety needs are. Ask your provider if it's safe for you to drive. If told, wear a bracelet that tracks where you are or shows that you're a person with memory loss. Work with your family to make big  legal or health decisions. This may include things like advance directives, medical power of attorney, or a living will. Where to find more information Alzheimer's Association: WesternTunes.it [employer] on Aging: BaseRingTones.pl World Health Organization: VisitDestination.com.br Contact a health care provider if: You have any new symptoms. Your symptoms get worse. You have problems with swallowing. Get help right away if: You feel very sad or feel like you may hurt yourself or others. You have thoughts about taking your own life. Your family members are worried about your safety. These symptoms may be an emergency. Take one of these steps right away: Go to your nearest emergency room. Call 911. Call the National Suicide Prevention Lifeline at 4430702773 or 988. Text the Crisis Text Line at 419-252-0962. This information is not intended to replace advice given to you by your health care provider. Make sure you discuss any questions you have with your health care provider. Document Revised: 02/26/2023 Document Reviewed: 07/14/2022 Elsevier Patient Education  2024 Elsevier Inc. Inflammation of the Stomach in Adults (Gastritis): What to Know Gastritis is when the lining of your stomach gets red and swollen (inflammation). There're two kinds of gastritis. There's gastritis that happens quickly. This is called acute gastritis. There's gastritis that happens over a long time. This is called chronic gastritis. Gastritis must be treated. If not, you can have sores or bleeding in your stomach. What are the causes? Germs that get to your stomach and cause an infection. Drinking too much alcohol. Taking some medicines. Too much acid in the  stomach. Disease of the stomach. Allergies. Cancer treatments (radiation). Smoking cigarettes or using products that contain nicotine or tobacco. What increases the risk? Having a disease of the intestines. Having an autoimmune disease, such as Crohn's disease. This is when  your immune system attacks other organs in the body. Using aspirin , ibuprofen, and other NSAIDs to treat other conditions. Stress. What are the signs or symptoms? Pain or burning in your belly. Feeling like you may throw up. Throwing up. Feeling too full after you eat. Losing weight. Bad breath. Blood in your throw-up or poop. In some cases, there are no symptoms. How is this treated? Gastritis is treated with medicines. The medicines that are used depend on what caused the condition. If the condition was caused by germs (bacteria), you'll be given antibiotics. If the condition was caused by too much acid in the stomach, you'll be given antacids, H2 blockers, or proton pump inhibitors. You may also be told to stop taking certain medicines, such as aspirin , ibuprofen, and other NSAIDs. Follow these instructions at home: Medicines Take your medicines only as told. Take your antibiotics as told. Do not stop taking them even if you start to feel better. Alcohol use Do not drink alcohol if: Your doctor tells you not to drink. You are pregnant, may be pregnant, or are planning to become pregnant. If you drink alcohol: Limit how much you have to: 0-1 drink a day if you're female. 0-2 drinks a day if you're female. Know how much alcohol is in your drink. In the [place] one drink is one 12 oz bottle of beer (355 mL), one 5 oz glass of wine (148 mL), or one 1 oz glass of hard liquor (44 mL). General instructions Eat small meals often, instead of large meals. Avoid foods and drinks that make your symptoms worse. Drink more fluid as told. Do not smoke, vape, or use nicotine or tobacco. Talk with your doctor about ways to manage stress. You may be told to: Get regular exercise. Do deep breathing. Do meditation or yoga. Contact a doctor if: Your symptoms get worse. The pain in your belly gets worse. Your symptoms go away and then come back. You have a fever. Get help right away if: You  throw up blood or a substance that looks like coffee grounds. You have black or dark red poop. You throw up every time you drink something. These symptoms may be an emergency. Call 911 right away. Do not wait to see if the symptoms will go away. Do not drive yourself to the hospital. This information is not intended to replace advice given to you by your health care provider. Make sure you discuss any questions you have with your health care provider. Document Revised: 06/30/2023 Document Reviewed: 06/30/2023 Elsevier Patient Education  2025 Elsevier Inc. Abdominal Pain, Adult  Many things can cause belly (abdominal) pain. In most cases, belly pain is not a serious problem and can be watched and treated at home. But in some cases, it can be serious. Your doctor will try to find the cause of your belly pain. Follow these instructions at home: Medicines Take over-the-counter and prescription medicines only as told by your doctor. Do not take medicines that help you poop (laxatives) unless told by your doctor. General instructions Watch your belly pain for any changes. Tell your doctor if the pain gets worse. Drink enough fluid to keep your pee (urine) pale yellow. Contact a doctor if: Your belly pain changes or gets worse. You  have very bad cramping or bloating in your belly. You vomit. Your pain gets worse with meals, after eating, or with certain foods. You have trouble pooping or have watery poop for more than 2-3 days. You are not hungry, or you lose weight without trying. You have signs of not getting enough fluid or water (dehydration). These may include: Dark pee, very little pee, or no pee. Cracked lips or dry mouth. Feeling sleepy or weak. You have pain when you pee or poop. Your belly pain wakes you up at night. You have blood in your pee. You have a fever. Get help right away if: You cannot stop vomiting. Your pain is only in one part of your belly, like on the right  side. You have bloody or black poop, or poop that looks like tar. You have trouble breathing. You have chest pain. These symptoms may be an emergency. Get help right away. Call 911. Do not wait to see if the symptoms will go away. Do not drive yourself to the hospital. This information is not intended to replace advice given to you by your health care provider. Make sure you discuss any questions you have with your health care provider. Document Revised: 02/12/2022 Document Reviewed: 02/12/2022 Elsevier Patient Education  2024 Elsevier Inc. all day  The above assessment and management plan was discussed with the patient. The patient verbalized understanding of and has agreed to the management plan. Patient is aware to call the clinic if they develop any new symptoms or if symptoms persist or worsen. Patient is aware when to return to the clinic for a follow-up visit. Patient educated on when it is appropriate to go to the emergency department.  Elide Stalzer [place] Thompson, DNP [facility] [address] [place], [place] [address] [phone]

## 2024-02-02 NOTE — Patient Instructions (Signed)
 Take Pantoprazole  in the morning  Take Zofran  and wait 5-10 minutes, take Donepezil  When first started this medication it can cause nausea, vomiting and diarrhea

## 2024-02-02 NOTE — Telephone Encounter (Signed)
 Pt has appt

## 2024-02-02 NOTE — Telephone Encounter (Signed)
 FYI Only or Action Required?: FYI only for provider.  Patient was last seen in primary care on 01/26/2024 by Lavell Bari LABOR, FNP.  Called Nurse Triage reporting Medication Reaction and Fatigue.  Symptoms began yesterday.  Interventions attempted: Rest, hydration, or home remedies.  Symptoms are: gradually improving.  Triage Disposition: See HCP Within 4 Hours (Or PCP Triage)  Patient/caregiver understands and will follow disposition?: Yes Answer Assessment - Initial Assessment Questions Same day visit scheduled with DOD d/t ongoing fatigue post ED discharge.  1. NAME of MEDICINE: What medicine(s) are you calling about?     Donepezil   2. QUESTION: What is your question? (e.g., double dose of medicine, side effect)     Patient was seen in the ED overnight on 9/21-9/22 for nausea, vomiting, jerking and fatigue that began after taking first dose of donepezil . States nausea and vomiting have resolved since holding the medication, but patient remains fatigued.  3. PRESCRIBER: Who prescribed the medicine? Reason: if prescribed by specialist, call should be referred to that group.     PCP  4. SYMPTOMS: Do you have any symptoms? If Yes, ask: What symptoms are you having?  How bad are the symptoms (e.g., mild, moderate, severe)     Had nausea, vomiting, weakness after first dose 2 nights ago, seen in ED  Protocols used: Medication Question Call-A-AH Copied from CRM [id number]. Topic: Clinical - Red Word Triage >> Feb 02, 2024 10:14 AM Geneva B wrote: [facility] that prompted transfer to Nurse Triage: pt was prescribed  donepezil  (ARICEPT ) 10 MG tablet  and caused vomiting and shaking pt has questions

## 2024-04-19 ENCOUNTER — Ambulatory Visit: Payer: Self-pay

## 2024-04-19 VITALS — BP 158/77 | HR 63 | Ht 62.0 in | Wt 119.0 lb

## 2024-04-19 DIAGNOSIS — Z0000 Encounter for general adult medical examination without abnormal findings: Secondary | ICD-10-CM

## 2024-04-19 DIAGNOSIS — Z13820 Encounter for screening for osteoporosis: Secondary | ICD-10-CM

## 2024-04-19 DIAGNOSIS — Z139 Encounter for screening, unspecified: Secondary | ICD-10-CM

## 2024-04-19 NOTE — Progress Notes (Signed)
 Chief Complaint  Patient presents with   Medicare Wellness     Subjective:   Kathryn Stein is a 78 y.o. female who presents for a Medicare Annual Wellness Visit.  Visit info / Clinical Intake: Medicare Wellness Visit Type:: Subsequent Annual Wellness Visit Persons participating in visit and providing information:: patient Medicare Wellness Visit Mode:: Telephone If telephone:: video declined Since this visit was completed virtually, some vitals may be partially provided or unavailable. Missing vitals are due to the limitations of the virtual format.: Documented vitals are patient reported If Telephone or Video please confirm:: I connected with patient using audio/video enable telemedicine. I verified patient identity with two identifiers, discussed telehealth limitations, and patient agreed to proceed. Patient Location:: home Provider Location:: home office Interpreter Needed?: No Pre-visit prep was completed: yes AWV questionnaire completed by patient prior to visit?: no Living arrangements:: with family/others Patient's Overall Health Status Rating: very good Typical amount of pain: none Does pain affect daily life?: no Are you currently prescribed opioids?: no  Dietary Habits and Nutritional Risks How many meals a day?: 3 Eats fruit and vegetables daily?: yes Most meals are obtained by: preparing own meals Diabetic:: no  Functional Status Activities of Daily Living (to include ambulation/medication): Independent Ambulation: Independent Medication Administration: Independent Home Management (perform basic housework or laundry): Independent Manage your own finances?: (!) no (pt's daughter) Primary transportation is: family / friends (pt's daughter) Concerns about vision?: (!) yes (supposed to wear glasses/been over a yr--suggest to update vision exam) Concerns about hearing?: (!) yes (somestimes) Uses hearing aids?: no  Fall Screening Falls in the past year?:  0 Number of falls in past year: 0 Was there an injury with Fall?: 0 Fall Risk Category Calculator: 0 Patient Fall Risk Level: Low Fall Risk  Fall Risk Patient at Risk for Falls Due to: No Fall Risks Fall risk Follow up: Falls evaluation completed; Education provided  Home and Transportation Safety: All rugs have non-skid backing?: yes All stairs or steps have railings?: yes Grab bars in the bathtub or shower?: (!) no Have non-skid surface in bathtub or shower?: yes Good home lighting?: yes Regular seat belt use?: yes Hospital stays in the last year:: no  Cognitive Assessment Difficulty concentrating, remembering, or making decisions? : yes (memory getting bad) Will 6CIT or Mini Cog be Completed: no 6CIT or Mini Cog Declined: patient has a diagnosis of dementia or cognitive impairment  Advance Directives (For Healthcare) Does Patient Have a Medical Advance Directive?: No Would patient like information on creating a medical advance directive?: No - Patient declined  Reviewed/Updated  Reviewed/Updated: Reviewed All (Medical, Surgical, Family, Medications, Allergies, Care Teams, Patient Goals); Medical History; Surgical History; Family History; Medications; Allergies; Care Teams; Patient Goals    Allergies (verified) Codeine, Darvocet [propoxyphene n-acetaminophen], and Propoxyphene   Current Medications (verified) Outpatient Encounter Medications as of 04/19/2024  Medication Sig   amLODipine  (NORVASC ) 10 MG tablet Take 1 tablet (10 mg total) by mouth daily.   aspirin  EC 81 MG tablet Take 1 tablet (81 mg total) by mouth daily as needed. For pain   atorvastatin  (LIPITOR) 20 MG tablet Take 1 tablet (20 mg total) by mouth daily.   citalopram  (CELEXA ) 20 MG tablet Take 1 tablet (20 mg total) by mouth daily.   diclofenac  Sodium (VOLTAREN ) 1 % GEL Apply 2 g topically 4 (four) times daily.   memantine  (NAMENDA ) 10 MG tablet Take 1 tablet (10 mg total) by mouth 2 (two) times daily.  mirtazapine  (REMERON ) 7.5 MG tablet Take 1 tablet (7.5 mg total) by mouth at bedtime.   ondansetron  (ZOFRAN ) 4 MG tablet Take 1 tablet (4 mg total) by mouth every 8 (eight) hours as needed for nausea or vomiting.   pantoprazole  (PROTONIX ) 20 MG tablet Take 1 tablet (20 mg total) by mouth daily.   baclofen  (LIORESAL ) 10 MG tablet Take 1 tablet (10 mg total) by mouth 3 (three) times daily. (Patient not taking: Reported on 04/19/2024)   cholecalciferol (VITAMIN D3) 25 MCG (1000 UNIT) tablet Take 1,000 Units by mouth daily. (Patient not taking: Reported on 04/19/2024)   donepezil  (ARICEPT ) 10 MG tablet Take 1 tablet (10 mg total) by mouth at bedtime. (Patient not taking: Reported on 04/19/2024)   No facility-administered encounter medications on file as of 04/19/2024.    History: Past Medical History:  Diagnosis Date   Abnormal heart rate    after birth of 3rd child   Anemia    Anorexia    Anxiety    Arthritis    Colon polyps    Depression    Dyspepsia    Gastritis    found on EGD   GERD (gastroesophageal reflux disease)    Hyperlipidemia    Hypertension    Marital relationship problem    Osteoporosis    Postmenopausal    Vertigo    Vertigo due to previous cerebellar infarction    Past Surgical History:  Procedure Laterality Date   BREAST SURGERY Left    biopsy benign   CESAREAN SECTION     per pt/ no c-section/had 5 vaginal births   COLONOSCOPY     MOUTH SURGERY     bone extraction   TUBAL LIGATION     Family History  Problem Relation Age of Onset   Colon cancer Mother    Diabetes Mother    Cancer Mother    Heart disease Mother    Hypertension Mother    Stroke Mother    Cancer Brother        throat - smoker   Kidney disease Brother    Prostate cancer Brother    Heart disease Sister    Cancer Sister        breast   Diabetes Sister    Liver disease Brother    Diabetes Brother    Hypertension Brother    Drug abuse Brother    Stroke Father 16   Diabetes Sister     Heart disease Sister    Hypertension Sister    Diabetes Sister    Cancer Sister    Cancer Brother        prostate   Stomach cancer Other        niece   Kidney cancer Other        niece   Cancer Daughter        kidney   Alcohol abuse Brother    Early death Sister    Heart disease Sister    Hypertension Sister    Hypertension Sister    Diabetes Sister    Mental illness Daughter    Social History   Occupational History   Occupation: disability/ retired    Associate Professor: [employer]  Tobacco Use   Smoking status: Never   Smokeless tobacco: Never  Vaping Use   Vaping status: Never Used  Substance and Sexual Activity   Alcohol use: No   Drug use: No   Sexual activity: Yes   Tobacco Counseling Counseling given: Yes  SDOH Screenings  Food Insecurity: Food Insecurity Present (04/19/2024)  Housing: Unknown (04/19/2024)  Transportation Needs: No Transportation Needs (04/19/2024)  Utilities: Not At Risk (04/19/2024)  Alcohol Screen: Low Risk  (04/15/2023)  Depression (PHQ2-9): Low Risk  (04/19/2024)  Financial Resource Strain: Low Risk  (04/15/2023)  Physical Activity: Inactive (04/19/2024)  Social Connections: Moderately Isolated (04/19/2024)  Stress: No Stress Concern Present (04/19/2024)  Tobacco Use: Low Risk  (04/19/2024)  Health Literacy: Inadequate Health Literacy (04/19/2024)   See flowsheets for full screening details  Depression Screen PHQ 2 & 9 Depression Scale- Over the past 2 weeks, how often have you been bothered by any of the following problems? Little interest or pleasure in doing things: 0 Feeling down, depressed, or hopeless (PHQ Adolescent also includes...irritable): 0 PHQ-2 Total Score: 0 Trouble falling or staying asleep, or sleeping too much: 0 Feeling tired or having little energy: 0 Poor appetite or overeating (PHQ Adolescent also includes...weight loss): 0 Feeling bad about yourself - or that you are a failure or have let yourself or your family down:  0 Trouble concentrating on things, such as reading the newspaper or watching television (PHQ Adolescent also includes...like school work): 0 Moving or speaking so slowly that other people could have noticed. Or the opposite - being so fidgety or restless that you have been moving around a lot more than usual: 0 Thoughts that you would be better off dead, or of hurting yourself in some way: 0 PHQ-9 Total Score: 0 If you checked off any problems, how difficult have these problems made it for you to do your work, take care of things at home, or get along with other people?: Not difficult at all  Depression Treatment Depression Interventions/Treatment : Currently on Treatment     Goals Addressed             This Visit's Progress    Prevent falls   On track            Objective:    There were no vitals filed for this visit. There is no height or weight on file to calculate BMI.  Hearing/Vision screen No results found. Immunizations and Health Maintenance Health Maintenance  Topic Date Due   Zoster Vaccines- Shingrix  (2 of 2) 11/06/2022   Bone Density Scan  03/14/2024   COVID-19 Vaccine (3 - 2025-26 season) 02/10/2025 (Originally 01/11/2024)   Medicare Annual Wellness (AWV)  04/19/2025   DTaP/Tdap/Td (5 - Td or Tdap) 05/02/2032   Pneumococcal Vaccine: 50+ Years  Completed   Influenza Vaccine  Completed   Hepatitis C Screening  Completed   Meningococcal B Vaccine  Aged Out   Mammogram  Discontinued   Colonoscopy  Discontinued        Assessment/Plan:  This is a routine wellness examination for [place].  Patient Care Team: Lavell Bari LABOR, FNP as PCP - General (Nurse Practitioner) Teressa Toribio SQUIBB, MD (Inactive) as Attending Physician (Gastroenterology) Golda Claudis PENNER, MD (Inactive) as Consulting Physician (Gastroenterology)  I have personally reviewed and noted the following in the patient's chart:   Medical and social history Use of alcohol, tobacco or illicit  drugs  Current medications and supplements including opioid prescriptions. Functional ability and status Nutritional status Physical activity Advanced directives List of other physicians Hospitalizations, surgeries, and ER visits in previous 12 months Vitals Screenings to include cognitive, depression, and falls Referrals and appointments  Orders Placed This Encounter  Procedures   DG [facility] DEXA    Standing Status:   Future  Expiration Date:   04/19/2025    Reason for Exam (SYMPTOM  OR DIAGNOSIS REQUIRED):   osteoporosis   In addition, I have reviewed and discussed with patient certain preventive protocols, quality metrics, and best practice recommendations. A written personalized care plan for preventive services as well as general preventive health recommendations were provided to patient.   Ozie Ned, CMA   04/19/2024   Return in 1 year (on 04/19/2025).  After Visit Summary: (MyChart) Due to this being a telephonic visit, the after visit summary with patients personalized plan was offered to patient via MyChart   Nurse Notes: pt is aware and due the following: 2nd shingles vaccine, Dexa --order placed

## 2024-04-19 NOTE — Patient Instructions (Signed)
 Kathryn Stein,  Thank you for taking the time for your Medicare Wellness Visit. I appreciate your continued commitment to your health goals. Please review the care plan we discussed, and feel free to reach out if I can assist you further.  Please note that Annual Wellness Visits do not include a physical exam. Some assessments may be limited, especially if the visit was conducted virtually. If needed, we may recommend an in-person follow-up with your provider.  Ongoing Care Seeing your primary care provider every 3 to 6 months helps us  monitor your health and provide consistent, personalized care.   Referrals If a referral was made during today's visit and you haven't received any updates within two weeks, please contact the referred provider directly to check on the status.  Recommended Screenings:  Health Maintenance  Topic Date Due   Zoster (Shingles) Vaccine (2 of 2) 11/06/2022   Osteoporosis screening with Bone Density Scan  03/14/2024   Medicare Annual Wellness Visit  04/14/2024   COVID-19 Vaccine (3 - 2025-26 season) 02/10/2025*   DTaP/Tdap/Td vaccine (5 - Td or Tdap) 05/02/2032   Pneumococcal Vaccine for age over 68  Completed   Flu Shot  Completed   Hepatitis C Screening  Completed   Meningitis B Vaccine  Aged Out   Breast Cancer Screening  Discontinued   Colon Cancer Screening  Discontinued  *Topic was postponed. The date shown is not the original due date.       04/19/2024   10:21 AM  Advanced Directives  Does Patient Have a Medical Advance Directive? No  Would patient like information on creating a medical advance directive? No - Patient declined    Vision: Annual vision screenings are recommended for early detection of glaucoma, cataracts, and diabetic retinopathy. These exams can also reveal signs of chronic conditions such as diabetes and high blood pressure.  Dental: Annual dental screenings help detect early signs of oral cancer, gum disease, and other conditions  linked to overall health, including heart disease and diabetes.  Please see the attached documents for additional preventive care recommendations.

## 2024-04-26 ENCOUNTER — Ambulatory Visit: Payer: Self-pay | Admitting: Family

## 2024-04-26 ENCOUNTER — Other Ambulatory Visit

## 2024-04-26 ENCOUNTER — Encounter: Payer: Self-pay | Admitting: Family

## 2024-05-10 ENCOUNTER — Telehealth: Payer: Self-pay

## 2024-05-10 NOTE — Progress Notes (Signed)
 Complex Care Management Note  Care Guide Note 05/10/2024 Name: Kathryn Stein MRN: [id number] DOB: 12-07-1945  Kathryn Stein is a 78 y.o. year old female who sees Lavell, Bari LABOR, FNP for primary care. I reached out to Kathryn Stein by phone today to offer complex care management services.  [name] was given information about Complex Care Management services today including:   The Complex Care Management services include support from the care team which includes your Nurse Care Manager, Clinical Social Worker, or Pharmacist.  The Complex Care Management team is here to help remove barriers to the health concerns and goals most important to you. Complex Care Management services are voluntary, and the patient may decline or stop services at any time by request to their care team member.   Complex Care Management Consent Status: Patient agreed to services and verbal consent obtained.   Follow up plan:  Telephone appointment with complex care management team member scheduled for:  05/23/2024  Encounter Outcome:  Patient Scheduled  Jeoffrey Buffalo , RMA     [place]  [facility] Guide  Direct Dial: [phone]  Website: [email or web address]

## 2024-05-23 ENCOUNTER — Other Ambulatory Visit: Payer: Self-pay

## 2024-05-23 DIAGNOSIS — F03918 Unspecified dementia, unspecified severity, with other behavioral disturbance: Secondary | ICD-10-CM

## 2024-05-23 NOTE — Patient Outreach (Addendum)
 Complex Care Management   Visit Note  05/23/2024  Name:  Kathryn Stein MRN: [id number] DOB: 1945-09-27  Situation: Referral received for Complex Care Management related to Dementia I obtained verbal consent from Patient.  Visit completed with Patient  on the phone. LCSW called patient for initial visit and reason for referral SDOH. Patient explained that she was having transportation concerns however her daughter is taking her now. Patient was amenable to being enrolled in [facility] services. Through the assessments, it was noted that pt may not be taking all of medications as well an unintentional weight loss. LCSW called patient's daughter Inocente and updated her about the services and concerns that was noted. LCSW encouraged Inocente to make an appointment with pt's PCP ASAP to address weight loss and reconcile medications. LCSW also messaged NP Hawks at PCP office with noted concerns.  LCSW scheduled visit for RN for 05/24/2024 at 2:00 PM , BSW for 05/26/2024 at 10:00 am with linkage for meals on wheels and LCSW to follow up about PCP in person visit on 05/26/2024 at  2:00 PM.   Background:   Past Medical History:  Diagnosis Date   Abnormal heart rate    after birth of 3rd child   Anemia    Anorexia    Anxiety    Arthritis    Colon polyps    Depression    Dyspepsia    Gastritis    found on EGD   GERD (gastroesophageal reflux disease)    Hyperlipidemia    Hypertension    Marital relationship problem    Osteoporosis    Postmenopausal    Vertigo    Vertigo due to previous cerebellar infarction     Assessment: Patient Reported Symptoms:  Cognitive Cognitive Status: Struggling with memory recall, Normal speech and language skills, Requires Assistance Decision Making, Other:, Confused or disoriented (Patient reports being forgetful, patient reports early onset of dementia) Cognitive/Intellectual Conditions Management [RPT]: None reported or documented in medical history or problem list       Neurological Neurological Review of Symptoms: No symptoms reported    HEENT HEENT Symptoms Reported: No symptoms reported, Other: (Patient reports wearing glasses)      Cardiovascular Cardiovascular Symptoms Reported: No symptoms reported Does patient have uncontrolled Hypertension?: No (Patient reports that blood pressure in normal range)    Respiratory Respiratory Symptoms Reported: No symptoms reported    Endocrine Endocrine Symptoms Reported: No symptoms reported Is patient diabetic?: No    Gastrointestinal Gastrointestinal Symptoms Reported: Unintentional weight loss Additional Gastrointestinal Details: Patient reports continued weight loss- patient is unsure of cause of weight      Genitourinary Genitourinary Symptoms Reported: No symptoms reported    Integumentary Integumentary Symptoms Reported: No symptoms reported    Musculoskeletal Musculoskelatal Symptoms Reviewed: No symptoms reported   Falls in the past year?: No Number of falls in past year: 1 or less Was there an injury with Fall?: No Fall Risk Category Calculator: 0 Patient Fall Risk Level: Low Fall Risk Patient at Risk for Falls Due to: No Fall Risks Fall risk Follow up: Falls evaluation completed  Psychosocial Psychosocial Symptoms Reported: No symptoms reported Additional Psychological Details: Patient reports depression in the past however currently not symptoms, Patient reports no anxiety symptoms, Behavioral Management Strategies: Medication therapy (Patient reports that she is not taking her depression medication)   Quality of Family Relationships: helpful, supportive Do you feel physically threatened by others?: No    05/23/2024    PHQ2-9 Depression Screening  Little interest or pleasure in doing things Not at all  Feeling down, depressed, or hopeless Not at all  PHQ-2 - Total Score 0  Trouble falling or staying asleep, or sleeping too much    Feeling tired or having little energy    Poor  appetite or overeating     Feeling bad about yourself - or that you are a failure or have let yourself or your family down    Trouble concentrating on things, such as reading the newspaper or watching television    Moving or speaking so slowly that other people could have noticed.  Or the opposite - being so fidgety or restless that you have been moving around a lot more than usual    Thoughts that you would be better off dead, or hurting yourself in some way    PHQ2-9 Total Score    If you checked off any problems, how difficult have these problems made it for you to do your work, take care of things at home, or get along with other people    Depression Interventions/Treatment      There were no vitals filed for this visit. Pain Scale: 0-10 Pain Score: 0-No pain  Medications Reviewed Today     Reviewed by Sherren Olam BRAVO, LCSW (Social Worker) on 05/23/24 at 1044  Med List Status: <None>   Medication Order Taking? Sig Documenting Provider Last Dose Status Informant  amLODipine  (NORVASC ) 10 MG tablet [id number] Yes Take 1 tablet (10 mg total) by mouth daily. Lavell Bari LABOR, FNP  Active   aspirin  EC 81 MG tablet [id number] Yes Take 1 tablet (81 mg total) by mouth daily as needed. For pain Lavell Bari A, FNP  Active   atorvastatin  (LIPITOR) 20 MG tablet [id number]  Take 1 tablet (20 mg total) by mouth daily. Lavell Bari A, FNP  Active   baclofen  (LIORESAL ) 10 MG tablet [id number]  Take 1 tablet (10 mg total) by mouth 3 (three) times daily.  Patient not taking: Reported on 04/19/2024   Lavell Bari A, FNP  Active   cholecalciferol (VITAMIN D3) 25 MCG (1000 UNIT) tablet [id number]  Take 1,000 Units by mouth daily.  Patient not taking: Reported on 04/19/2024   [provider]  Active   citalopram  (CELEXA ) 20 MG tablet [id number] Yes Take 1 tablet (20 mg total) by mouth daily. Lavell Bari A, FNP  Active   diclofenac  Sodium (VOLTAREN ) 1 % GEL [id number]  Apply 2 g topically 4 (four)  times daily.  Patient not taking: Reported on 05/23/2024   Lavell Bari A, FNP  Active   donepezil  (ARICEPT ) 10 MG tablet [id number]  Take 1 tablet (10 mg total) by mouth at bedtime.  Patient not taking: Reported on 04/19/2024   Lavell Bari LABOR, FNP  Active   memantine  (NAMENDA ) 10 MG tablet [id number]  Take 1 tablet (10 mg total) by mouth 2 (two) times daily. Lavell Bari A, FNP  Active   mirtazapine  (REMERON ) 7.5 MG tablet [id number]  Take 1 tablet (7.5 mg total) by mouth at bedtime. Lavell Bari A, FNP  Active   ondansetron  (ZOFRAN ) 4 MG tablet [id number] Yes Take 1 tablet (4 mg total) by mouth every 8 (eight) hours as needed for nausea or vomiting. [place] Hummer, Nena, NP  Active   pantoprazole  (PROTONIX ) 20 MG tablet [id number] Yes Take 1 tablet (20 mg total) by mouth daily. [place], Sandra, NP  Active  Recommendation:   PCP Follow-up Continue Current Plan of Care  Follow Up Plan:   Telephone follow-up RN follow up 05/24/2024 at 10:00am. BSW follow up on 05/26/2024 at 10:00am and LCSW on 05/26/2024 at 2:00 PM.  Olam Ally, MSW, LCSW [place]  [facility], [facility] Licensed Clinical Social Worker Direct Dial: [phone]

## 2024-05-23 NOTE — Patient Instructions (Signed)
 Visit Information  Thank you for taking time to visit with me today. Please don't hesitate to contact me if I can be of assistance to you before our next scheduled appointment.  Our next appointment is by telephone on 05/26/2024 at 2:00 PM Please call the care guide team at [phone] if you need to cancel or reschedule your appointment.   Following is a copy of your care plan:   Goals Addressed             This Visit's Progress    VBCI Social Work Care Plan:LCSW       Problems:   Cognitive Deficits  CSW Clinical Goal(s):   Over the next 90 days the Patient will work with Child Psychotherapist to address concerns related to cognitive deficits related to dementia.  Over the next two weeks, patient will coordinate with daughter to schedule a face to face visit with PCP. Patient will attend VBCI RN visit that has been scheduled.  Interventions:  Dementia Care:   Current level of care: Home with other family or significant other(s): family member: adult daughter.  Evaluation of patient safety in current living environment and review of Dementia resources and support: LCSW encouraged patient not to use stove when she is home alone.Patient reports leaving the stove on the past. LCSW suggested patient using microwave. Patient reports being forgetful. LCSW completed medication reconciliation and it appears that patient is not taking some of her medications.( Atorvastation and Memantine ). Due to concerns about medication and safety, patient gave LCSW verbal permission to call daughter Inocente. LCSW called patient's daughter Inocente and updated her about the services and concerns that was noted. LCSW encouraged Inocente to make an appointment with pt's PCP ASAP to address weight loss and reconcile medications.LCSW noted safety concern about patient cooking when no one is home.  Inocente was in agreement with strategies. It was dicussed patient's unintentional weight loss. It was decided that patient would  meet with VBCI RN [place] on 05/24/2024 at 10:00 a m.  ADL's Assessed needs, level of care concerns, how currently meeting needs and barriers to care Discussed family support and building support system : Patient lives with adult daughter. Meals on wheels (Will make a referral to BSW [place]) Problem Solving/Task Center strategies reviewed Solution-Focused Strategies employed:  Mental Health Active listening / Reflection utilized Behavioral Activation reviewed Emotional Support Provided Patient reports no symptom of depression or anxiety at this time.  Patient Goals/Self-Care Activities:  Collaborate with Meals on Wheels. Continue taking your medication as prescribed.   Coordinate with LCSW to assist with Cognitive deficits-dementia. Scheduled in person PCP appointment. Plan:   Telephone follow up appointment with care management team member scheduled for:  RN appointment on 05/24/2024 at 10:00a m. BSW will call patient on 05/26/2024 at 10:00 am, LCSW will call patient on 05/26/2024 at 2:00 PM.         Please call 911 if you are experiencing a Mental Health or Behavioral Health Crisis or need someone to talk to.  Patient verbalized understanding of Care plan and visit instructions communicated this visit  Olam Ally, MSW, LCSW [place]  [facility], [facility] Licensed Clinical Social Worker Direct Dial: [phone]

## 2024-05-24 ENCOUNTER — Telehealth

## 2024-05-24 ENCOUNTER — Telehealth: Payer: Self-pay | Admitting: *Deleted

## 2024-05-24 ENCOUNTER — Encounter: Payer: Self-pay | Admitting: *Deleted

## 2024-05-24 NOTE — Patient Instructions (Signed)
 [name] - I am sorry I was unable to reach you today for our scheduled appointment. I work with Lavell Bari LABOR, FNP and am calling to support your healthcare needs. Please contact me at [phone] at your earliest convenience. I look forward to speaking with you soon.   Thank you,  Rosina Forte, BSN RN [facility], [facility] RN Care Manager Direct Dial: [phone]  Fax: [phone]

## 2024-05-25 ENCOUNTER — Telehealth: Payer: Self-pay | Admitting: *Deleted

## 2024-05-25 NOTE — Progress Notes (Signed)
 Complex Care Management Note Care Guide Note  05/25/2024 Name: [name] MRN: [id number] DOB: 06-15-1945  [name] is a 79 y.o. year old female who is a primary care patient of Lavell Bari LABOR, FNP . The community resource team was consulted for assistance with Food Insecurity  SDOH screenings and interventions completed:  Yes  Social Drivers of Health From This Encounter   Food Insecurity: Food Insecurity Present (05/25/2024)   Epic    Worried About Programme Researcher, Broadcasting/film/video in the Last Year: Sometimes true    Barista in the Last Year: Sometimes true    SDOH Interventions Today    Flowsheet Row Most Recent Value  SDOH Interventions   Food Insecurity Interventions Community Resources Provided  Social Connections Interventions Community Resources Provided  [employer] mail  information on [place] senior services]     Care guide performed the following interventions: Patient provided with information about care guide support team and interviewed to confirm resource needs.  Follow Up Plan:  No further follow up planned at this time. The patient has been provided with needed resources.  Encounter Outcome:  Patient Visit Completed [place]  [facility] Health[facility] Health Care Guide  Direct [phone] Fax:[phone] Website: [email or web address]

## 2024-05-26 ENCOUNTER — Other Ambulatory Visit: Payer: Self-pay

## 2024-05-26 NOTE — Patient Outreach (Signed)
 Social Drivers of Health  Community Resource and Care Coordination Visit Note   05/26/2024  Name: Kathryn Stein MRN: [id number] DOB:[birth year]  Situation: Referral received for [facility] needs assessment and assistance related to Preparing meals due to dementia. I obtained verbal consent from Patient.  Visit completed with Patient and daughter Inocente Overlie on the phone.   Background:   SDOH Interventions Today    Flowsheet Row Most Recent Value  SDOH Interventions   Food Insecurity Interventions Other (Comment)  [Referral completed for Meals on Wheels. 1.5 yr waiting list. SW spoke to pt and daughter for microwave healthy options. Daughters help with food if patient runs low.]  Financial Strain Interventions Intervention Not Indicated  [Daughter manages bills and help when needed]     Assessment:   Goals Addressed             This Visit's Progress    BSW Goals       Current SDOH Barriers:  Meal preparation due to dementia  Interventions: Patient interviewed and appropriate screenings performed Referred patient to community resources  Provided patient with information about Meals on Wheels and healthy food options for microwave Advised patient to purchase steam bags vegetables in the frozen food section, along with meals that are microwaveable.  Spoke to daughter who agreed to assist with making these purchases. Collaborated with [facility] (community agency) re: to add patient to Meals on Wheels waiting list.  The wait is 1.5 years and patients daughter will be contacted. Patient reports burning food on the stove due to memory.  Her daughters have asked that she no longer cook.   Patient does not request a follow up.          Recommendation:   Patient will seek out food options that can be prepared in the microwave as she awaits reply from Meals on Wheels.  Follow Up Plan:   Patient declines further calls or assistance. [employer] will be closed.  Patient has been provided contact information should new needs arise.   Tillman Gardener, BSW [place]  [facility], [facility] Social Worker Direct Dial: [phone]  Fax: [phone] Website: [email or web address]

## 2024-05-26 NOTE — Patient Outreach (Signed)
 LCSW called pt at scheduled time and was unable to reach pt. LCSW was unable to leave voicemail as it is not set up. LCSW scheduled another outreach for 06/06/2024 at 10:00 am. It appears that patient has a PCP appointment scheduled for 06/03/2024.  Olam Ally, MSW, LCSW [place]  [facility], [facility] Licensed Clinical Social Worker Direct Dial: [phone]

## 2024-05-26 NOTE — Patient Instructions (Signed)
 Visit Information  Thank you for taking time to visit with me today. Please don't hesitate to contact me if I can be of assistance to you before our next scheduled appointment.   Following is a copy of your care plan:   Goals Addressed             This Visit's Progress    BSW Goals       Current SDOH Barriers:  Meal preparation due to dementia  Interventions: Patient interviewed and appropriate screenings performed Referred patient to community resources  Provided patient with information about Meals on Wheels and healthy food options for microwave Advised patient to purchase steam bags vegetables in the frozen food section, along with meals that are microwaveable.  Spoke to daughter who agreed to assist with making these purchases. Collaborated with [facility] (community agency) re: to add patient to Meals on Wheels waiting list.  The wait is 1.5 years and patients daughter will be contacted. Patient reports burning food on the stove due to memory.  Her daughters have asked that she no longer cook.   Patient does not request a follow up.          Please call 911 if you are experiencing a Mental Health or Behavioral Health Crisis or need someone to talk to.  Patient verbalized understanding of Care plan and visit instructions communicated this visit  Tillman Gardener, BSW [place]  [facility] Social Worker Direct Dial: [phone]  Fax: [phone] Website: [email or web address]

## 2024-05-26 NOTE — Patient Instructions (Signed)
 Kathryn Stein - I am sorry I was unable to reach you today for our scheduled appointment. I work with Lavell Bari LABOR, FNP and am calling to support your healthcare needs. Please contact me at [phone] at your earliest convenience. I look forward to speaking with you soon.   Another outreach was scheduled for 06/06/2024 at 10:00am  Thank you,  Olam Ally, MSW, LCSW [place]  [facility], [facility] Licensed Clinical Social Worker Direct Dial: [phone]

## 2024-05-27 ENCOUNTER — Telehealth: Payer: Self-pay | Admitting: *Deleted

## 2024-05-27 ENCOUNTER — Encounter: Payer: Self-pay | Admitting: *Deleted

## 2024-05-27 NOTE — Patient Instructions (Signed)
 Kathryn Stein - I am sorry I was unable to reach you today for our scheduled appointment. I work with Lavell Bari LABOR, FNP and am calling to support your healthcare needs. Please contact me at [phone] at your earliest convenience. I look forward to speaking with you soon.   Thank you,  Rosina Forte, BSN RN [facility], [facility] RN Care Manager Direct Dial: [phone]  Fax: [phone]

## 2024-06-03 ENCOUNTER — Ambulatory Visit: Admitting: Family

## 2024-06-03 ENCOUNTER — Encounter: Payer: Self-pay | Admitting: Family

## 2024-06-03 VITALS — BP 173/74 | HR 65 | Temp 97.8°F | Ht 62.0 in | Wt 118.0 lb

## 2024-06-03 DIAGNOSIS — M810 Age-related osteoporosis without current pathological fracture: Secondary | ICD-10-CM

## 2024-06-03 DIAGNOSIS — E559 Vitamin D deficiency, unspecified: Secondary | ICD-10-CM

## 2024-06-03 DIAGNOSIS — F02B18 Dementia in other diseases classified elsewhere, moderate, with other behavioral disturbance: Secondary | ICD-10-CM

## 2024-06-03 DIAGNOSIS — K2900 Acute gastritis without bleeding: Secondary | ICD-10-CM | POA: Diagnosis not present

## 2024-06-03 DIAGNOSIS — G300 Alzheimer's disease with early onset: Secondary | ICD-10-CM | POA: Diagnosis not present

## 2024-06-03 DIAGNOSIS — K219 Gastro-esophageal reflux disease without esophagitis: Secondary | ICD-10-CM | POA: Diagnosis not present

## 2024-06-03 DIAGNOSIS — E441 Mild protein-calorie malnutrition: Secondary | ICD-10-CM | POA: Diagnosis not present

## 2024-06-03 DIAGNOSIS — E782 Mixed hyperlipidemia: Secondary | ICD-10-CM

## 2024-06-03 DIAGNOSIS — R252 Cramp and spasm: Secondary | ICD-10-CM

## 2024-06-03 DIAGNOSIS — I10 Essential (primary) hypertension: Secondary | ICD-10-CM | POA: Diagnosis not present

## 2024-06-03 DIAGNOSIS — F331 Major depressive disorder, recurrent, moderate: Secondary | ICD-10-CM

## 2024-06-03 MED ORDER — MEMANTINE HCL 10 MG PO TABS: 10.0000 mg | ORAL_TABLET | Freq: Two times a day (BID) | ORAL | 1 refills | Status: AC

## 2024-06-03 MED ORDER — DONEPEZIL HCL 10 MG PO TABS: 10.0000 mg | ORAL_TABLET | Freq: Every day | ORAL | 2 refills | Status: AC

## 2024-06-03 MED ORDER — ATORVASTATIN CALCIUM 20 MG PO TABS: 20.0000 mg | ORAL_TABLET | Freq: Every day | ORAL | 1 refills | Status: AC

## 2024-06-03 MED ORDER — PANTOPRAZOLE SODIUM 20 MG PO TBEC: 20.0000 mg | DELAYED_RELEASE_TABLET | Freq: Every day | ORAL | 0 refills | Status: AC

## 2024-06-03 MED ORDER — CITALOPRAM HYDROBROMIDE 20 MG PO TABS: 20.0000 mg | ORAL_TABLET | Freq: Every day | ORAL | 2 refills | Status: AC

## 2024-06-03 MED ORDER — AMLODIPINE BESYLATE 10 MG PO TABS: 10.0000 mg | ORAL_TABLET | Freq: Every day | ORAL | 1 refills | Status: AC

## 2024-06-03 MED ORDER — BACLOFEN 10 MG PO TABS: 10.0000 mg | ORAL_TABLET | Freq: Three times a day (TID) | ORAL | 0 refills | Status: AC

## 2024-06-03 MED ORDER — ASPIRIN EC 81 MG PO TBEC: 81.0000 mg | DELAYED_RELEASE_TABLET | Freq: Every day | ORAL | 2 refills | Status: AC | PRN

## 2024-06-03 NOTE — Progress Notes (Signed)
 "  Subjective:    Patient ID: Kathryn Stein, female    DOB: 1945/05/18, 79 y.o.   MRN: [id number]  Chief Complaint  Patient presents with   Weight Loss    PT presents to the office today for chronic follow up.   She has osteoporosis. She stopped her Evista . Her last dexa scan 03/14/22.   Her husband passed in March 2023 and has had trouble sleeping. She lost 20lb+ while grieving. She has gained 28 lbs since starting her on Remeron  15 mg for protein malnutrition. Her weight is stable today at 118 lb.     06/03/2024   10:44 AM 04/19/2024   12:55 PM 02/02/2024   11:11 AM  Last 3 Weights  Weight (lbs) 118 lb 119 lb 119 lb 3.2 oz  Weight (kg) 53.524 kg 53.978 kg 54.069 kg    She has dementia and taking namenda  and Aricpet. Her daughter is living with her.   Hypertension This is a chronic problem. The current episode started more than 1 year ago. The problem has been waxing and waning since onset. The problem is uncontrolled. Associated symptoms include malaise/fatigue. Pertinent negatives include no peripheral edema or shortness of breath. Risk factors for coronary artery disease include dyslipidemia and sedentary lifestyle. The current treatment provides moderate improvement.  Hyperlipidemia This is a chronic problem. The current episode started more than 1 year ago. The problem is uncontrolled. Recent lipid tests were reviewed and are high. Pertinent negatives include no shortness of breath. Current antihyperlipidemic treatment includes statins. The current treatment provides moderate improvement of lipids. Risk factors for coronary artery disease include dyslipidemia, hypertension, a sedentary lifestyle and post-menopausal.  Depression        This is a chronic problem.  The current episode started more than 1 year ago.   The problem occurs intermittently.  Associated symptoms include no helplessness, no hopelessness and not sad.  Past treatments include SSRIs - Selective serotonin reuptake  inhibitors.     Review of Systems  Constitutional:  Positive for malaise/fatigue.  Respiratory:  Negative for shortness of breath.   All other systems reviewed and are negative.  Family History  Problem Relation Age of Onset   Colon cancer Mother    Diabetes Mother    Cancer Mother    Heart disease Mother    Hypertension Mother    Stroke Mother    Cancer Brother        throat - smoker   Kidney disease Brother    Prostate cancer Brother    Heart disease Sister    Cancer Sister        breast   Diabetes Sister    Liver disease Brother    Diabetes Brother    Hypertension Brother    Drug abuse Brother    Stroke Father 91   Diabetes Sister    Heart disease Sister    Hypertension Sister    Diabetes Sister    Cancer Sister    Cancer Brother        prostate   Stomach cancer Other        niece   Kidney cancer Other        niece   Cancer Daughter        kidney   Alcohol abuse Brother    Early death Sister    Heart disease Sister    Hypertension Sister    Hypertension Sister    Diabetes Sister    Mental illness Daughter  Social History   Socioeconomic History   Marital status: Widowed    Spouse name: Not on file   Number of children: 5   Years of education: 6   Highest education level: 6th grade  Occupational History   Occupation: disability/ retired    Associate Professor: [employer]  Tobacco Use   Smoking status: Never   Smokeless tobacco: Never  Vaping Use   Vaping status: Never Used  Substance and Sexual Activity   Alcohol use: No   Drug use: No   Sexual activity: Yes  Other Topics Concern   Not on file  Social History Narrative   Not on file   Social Drivers of Health   Tobacco Use: Low Risk (06/03/2024)   Patient History    Smoking Tobacco Use: Never    Smokeless Tobacco Use: Never    Passive Exposure: Not on file  Financial Resource Strain: Low Risk (05/26/2024)   Overall Financial Resource Strain (CARDIA)    Difficulty of Paying Living Expenses: Not  very hard  Food Insecurity: Food Insecurity Present (05/25/2024)   Epic    Worried About Programme Researcher, Broadcasting/film/video in the Last Year: Sometimes true    Ran Out of Food in the Last Year: Sometimes true  Transportation Needs: No Transportation Needs (05/23/2024)   Epic    Lack of Transportation (Medical): No    Lack of Transportation (Non-Medical): No  Physical Activity: Inactive (04/19/2024)   Exercise Vital Sign    Days of Exercise per Week: 0 days    Minutes of Exercise per Session: 0 min  Stress: No Stress Concern Present (04/19/2024)   [employer] of Occupational Health - Occupational Stress Questionnaire    Feeling of Stress: Only a little  Social Connections: Moderately Isolated (04/19/2024)   Social Connection and Isolation Panel    Frequency of Communication with Friends and Family: More than three times a week    Frequency of Social Gatherings with Friends and Family: Three times a week    Attends Religious Services: More than 4 times per year    Active Member of Clubs or Organizations: No    Attends Banker Meetings: Never    Marital Status: Widowed  Depression (PHQ2-9): Low Risk (06/03/2024)   Depression (PHQ2-9)    PHQ-2 Score: 0  Alcohol Screen: Low Risk (04/15/2023)   Alcohol Screen    Last Alcohol Screening Score (AUDIT): 0  Housing: Low Risk (05/23/2024)   Epic    Unable to Pay for Housing in the Last Year: No    Number of Times Moved in the Last Year: 0    Homeless in the Last Year: No  Utilities: Not At Risk (04/19/2024)   Epic    Threatened with loss of utilities: No  Health Literacy: Inadequate Health Literacy (04/19/2024)   B1300 Health Literacy    Frequency of need for help with medical instructions: Sometimes        Objective:   Physical Exam Vitals reviewed.  Constitutional:      General: She is not in acute distress.    Appearance: She is well-developed.  HENT:     Head: Normocephalic and atraumatic.     Right Ear: Tympanic membrane  normal.     Left Ear: Tympanic membrane normal.  Eyes:     Pupils: Pupils are equal, round, and reactive to light.  Neck:     Thyroid : No thyromegaly.  Cardiovascular:     Rate and Rhythm: Normal rate and regular rhythm.  Heart sounds: Normal heart sounds. No murmur heard. Pulmonary:     Effort: Pulmonary effort is normal. No respiratory distress.     Breath sounds: Normal breath sounds. No wheezing.  Abdominal:     General: Bowel sounds are normal. There is no distension.     Palpations: Abdomen is soft.     Tenderness: There is no abdominal tenderness.  Musculoskeletal:        General: No tenderness. Normal range of motion.     Cervical back: Normal range of motion and neck supple.  Skin:    General: Skin is warm and dry.  Neurological:     Mental Status: She is alert and oriented to person, place, and time.     Cranial Nerves: No cranial nerve deficit.     Deep Tendon Reflexes: Reflexes are normal and symmetric.  Psychiatric:        Behavior: Behavior normal.        Thought Content: Thought content normal.        Judgment: Judgment normal.       BP (!) 179/89   Pulse 65   Temp 97.8 F (36.6 C) (Temporal)   Ht 5' 2 (1.575 m)   Wt 118 lb (53.5 kg)   SpO2 100%   BMI 21.58 kg/m      Assessment & Plan:   JESSIAH WOJNAR comes in today with chief complaint of Weight Loss   Diagnosis and orders addressed:  1. Essential hypertension, benign - amLODipine  (NORVASC ) 10 MG tablet; Take 1 tablet (10 mg total) by mouth daily.  Dispense: 90 tablet; Refill: 1 - CMP14+EGFR - CBC with Differential/Platelet  2. Mixed hyperlipidemia - aspirin  EC 81 MG tablet; Take 1 tablet (81 mg total) by mouth daily as needed. For pain  Dispense: 90 tablet; Refill: 2 - atorvastatin  (LIPITOR) 20 MG tablet; Take 1 tablet (20 mg total) by mouth daily.  Dispense: 90 tablet; Refill: 1 - CMP14+EGFR - CBC with Differential/Platelet  3. Leg cramps - baclofen  (LIORESAL ) 10 MG tablet; Take 1  tablet (10 mg total) by mouth 3 (three) times daily.  Dispense: 30 each; Refill: 0 - CMP14+EGFR - CBC with Differential/Platelet  4. Moderate episode of recurrent major depressive disorder (HCC) - citalopram  (CELEXA ) 20 MG tablet; Take 1 tablet (20 mg total) by mouth daily.  Dispense: 90 tablet; Refill: 2 - CMP14+EGFR - CBC with Differential/Platelet  5. Moderate early onset Alzheimer's dementia with other behavioral disturbance (HCC) - donepezil  (ARICEPT ) 10 MG tablet; Take 1 tablet (10 mg total) by mouth at bedtime.  Dispense: 90 tablet; Refill: 2 - memantine  (NAMENDA ) 10 MG tablet; Take 1 tablet (10 mg total) by mouth 2 (two) times daily.  Dispense: 180 tablet; Refill: 1 - CMP14+EGFR - CBC with Differential/Platelet  6. Acute gastritis without hemorrhage, unspecified gastritis type  - CMP14+EGFR - CBC with Differential/Platelet  7. Mild protein-calorie malnutrition (Primary) - CMP14+EGFR - CBC with Differential/Platelet  8. Age-related osteoporosis without current pathological fracture - CMP14+EGFR - CBC with Differential/Platelet  9. Vitamin D  deficiency  - CMP14+EGFR - CBC with Differential/Platelet  10. Gastroesophageal reflux disease, unspecified whether esophagitis present - pantoprazole  (PROTONIX ) 20 MG tablet; Take 1 tablet (20 mg total) by mouth daily.  Dispense: 90 tablet; Refill: 0 - CMP14+EGFR - CBC with Differential/Platelet   Labs pending Has not taken Norvasc  10 mg today.  -Dash diet information given -Exercise encouraged - Stress Management  -Continue current meds -RTO in 2 week s  Health Maintenance reviewed Diet  and exercise encouraged  Follow up plan: 2 weeks to recheck HTN  Bari Learn, FNP  "

## 2024-06-03 NOTE — Patient Instructions (Signed)
 Hypertension, Adult High blood pressure (hypertension) is when the force of blood pumping through the arteries is too strong. The arteries are the blood vessels that carry blood from the heart throughout the body. Hypertension forces the heart to work harder to pump blood and may cause arteries to become narrow or stiff. Untreated or uncontrolled hypertension can lead to a heart attack, heart failure, a stroke, kidney disease, and other problems. A blood pressure reading consists of a higher number over a lower number. Ideally, your blood pressure should be below 120/80. The first ("top") number is called the systolic pressure. It is a measure of the pressure in your arteries as your heart beats. The second ("bottom") number is called the diastolic pressure. It is a measure of the pressure in your arteries as the heart relaxes. What are the causes? The exact cause of this condition is not known. There are some conditions that result in high blood pressure. What increases the risk? Certain factors may make you more likely to develop high blood pressure. Some of these risk factors are under your control, including: Smoking. Not getting enough exercise or physical activity. Being overweight. Having too much fat, sugar, calories, or salt (sodium) in your diet. Drinking too much alcohol. Other risk factors include: Having a personal history of heart disease, diabetes, high cholesterol, or kidney disease. Stress. Having a family history of high blood pressure and high cholesterol. Having obstructive sleep apnea. Age. The risk increases with age. What are the signs or symptoms? High blood pressure may not cause symptoms. Very high blood pressure (hypertensive crisis) may cause: Headache. Fast or irregular heartbeats (palpitations). Shortness of breath. Nosebleed. Nausea and vomiting. Vision changes. Severe chest pain, dizziness, and seizures. How is this diagnosed? This condition is diagnosed by  measuring your blood pressure while you are seated, with your arm resting on a flat surface, your legs uncrossed, and your feet flat on the floor. The cuff of the blood pressure monitor will be placed directly against the skin of your upper arm at the level of your heart. Blood pressure should be measured at least twice using the same arm. Certain conditions can cause a difference in blood pressure between your right and left arms. If you have a high blood pressure reading during one visit or you have normal blood pressure with other risk factors, you may be asked to: Return on a different day to have your blood pressure checked again. Monitor your blood pressure at home for 1 week or longer. If you are diagnosed with hypertension, you may have other blood or imaging tests to help your health care provider understand your overall risk for other conditions. How is this treated? This condition is treated by making healthy lifestyle changes, such as eating healthy foods, exercising more, and reducing your alcohol intake. You may be referred for counseling on a healthy diet and physical activity. Your health care provider may prescribe medicine if lifestyle changes are not enough to get your blood pressure under control and if: Your systolic blood pressure is above 130. Your diastolic blood pressure is above 80. Your personal target blood pressure may vary depending on your medical conditions, your age, and other factors. Follow these instructions at home: Eating and drinking  Eat a diet that is high in fiber and potassium, and low in sodium, added sugar, and fat. An example of this eating plan is called the DASH diet. DASH stands for Dietary Approaches to Stop Hypertension. To eat this way: Eat  plenty of fresh fruits and vegetables. Try to fill one half of your plate at each meal with fruits and vegetables. Eat whole grains, such as whole-wheat pasta, brown rice, or whole-grain bread. Fill about one  fourth of your plate with whole grains. Eat or drink low-fat dairy products, such as skim milk or low-fat yogurt. Avoid fatty cuts of meat, processed or cured meats, and poultry with skin. Fill about one fourth of your plate with lean proteins, such as fish, chicken without skin, beans, eggs, or tofu. Avoid pre-made and processed foods. These tend to be higher in sodium, added sugar, and fat. Reduce your daily sodium intake. Many people with hypertension should eat less than 1,500 mg of sodium a day. Do not drink alcohol if: Your health care provider tells you not to drink. You are pregnant, may be pregnant, or are planning to become pregnant. If you drink alcohol: Limit how much you have to: 0-1 drink a day for women. 0-2 drinks a day for men. Know how much alcohol is in your drink. In the [place] one drink equals one 12 oz bottle of beer (355 mL), one 5 oz glass of wine (148 mL), or one 1 oz glass of hard liquor (44 mL). Lifestyle  Work with your health care provider to maintain a healthy body weight or to lose weight. Ask what an ideal weight is for you. Get at least 30 minutes of exercise that causes your heart to beat faster (aerobic exercise) most days of the week. Activities may include walking, swimming, or biking. Include exercise to strengthen your muscles (resistance exercise), such as Pilates or lifting weights, as part of your weekly exercise routine. Try to do these types of exercises for 30 minutes at least 3 days a week. Do not use any products that contain nicotine or tobacco. These products include cigarettes, chewing tobacco, and vaping devices, such as e-cigarettes. If you need help quitting, ask your health care provider. Monitor your blood pressure at home as told by your health care provider. Keep all follow-up visits. This is important. Medicines Take over-the-counter and prescription medicines only as told by your health care provider. Follow directions carefully. Blood  pressure medicines must be taken as prescribed. Do not skip doses of blood pressure medicine. Doing this puts you at risk for problems and can make the medicine less effective. Ask your health care provider about side effects or reactions to medicines that you should watch for. Contact a health care provider if you: Think you are having a reaction to a medicine you are taking. Have headaches that keep coming back (recurring). Feel dizzy. Have swelling in your ankles. Have trouble with your vision. Get help right away if you: Develop a severe headache or confusion. Have unusual weakness or numbness. Feel faint. Have severe pain in your chest or abdomen. Vomit repeatedly. Have trouble breathing. These symptoms may be an emergency. Get help right away. Call 911. Do not wait to see if the symptoms will go away. Do not drive yourself to the hospital. Summary Hypertension is when the force of blood pumping through your arteries is too strong. If this condition is not controlled, it may put you at risk for serious complications. Your personal target blood pressure may vary depending on your medical conditions, your age, and other factors. For most people, a normal blood pressure is less than 120/80. Hypertension is treated with lifestyle changes, medicines, or a combination of both. Lifestyle changes include losing weight, eating a healthy,  low-sodium diet, exercising more, and limiting alcohol. This information is not intended to replace advice given to you by your health care provider. Make sure you discuss any questions you have with your health care provider. Document Revised: 03/05/2021 Document Reviewed: 03/05/2021 Elsevier Patient Education  2024 [employer].

## 2024-06-04 LAB — CMP14+EGFR
ALT: 8 [IU]/L (ref 0–32)
AST: 17 [IU]/L (ref 0–40)
Albumin: 4.5 g/dL (ref 3.8–4.8)
Alkaline Phosphatase: 73 [IU]/L (ref 49–135)
BUN/Creatinine Ratio: 27 (ref 12–28)
BUN: 22 mg/dL (ref 8–27)
Bilirubin Total: 0.7 mg/dL (ref 0.0–1.2)
CO2: 25 mmol/L (ref 20–29)
Calcium: 9.7 mg/dL (ref 8.7–10.3)
Chloride: 104 mmol/L (ref 96–106)
Creatinine, Ser: 0.83 mg/dL (ref 0.57–1.00)
Globulin, Total: 2.3 g/dL (ref 1.5–4.5)
Glucose: 72 mg/dL (ref 70–99)
Potassium: 4.5 mmol/L (ref 3.5–5.2)
Sodium: 143 mmol/L (ref 134–144)
Total Protein: 6.8 g/dL (ref 6.0–8.5)
eGFR: 72 mL/min/{1.73_m2}

## 2024-06-04 LAB — CBC WITH DIFFERENTIAL/PLATELET
Basophils Absolute: 0.1 10*3/uL (ref 0.0–0.2)
Basos: 1 %
EOS (ABSOLUTE): 0 10*3/uL (ref 0.0–0.4)
Eos: 1 %
Hematocrit: 39.3 % (ref 34.0–46.6)
Hemoglobin: 12.1 g/dL (ref 11.1–15.9)
Immature Grans (Abs): 0 10*3/uL (ref 0.0–0.1)
Immature Granulocytes: 0 %
Lymphocytes Absolute: 1.5 10*3/uL (ref 0.7–3.1)
Lymphs: 36 %
MCH: 28.8 pg (ref 26.6–33.0)
MCHC: 30.8 g/dL — ABNORMAL LOW (ref 31.5–35.7)
MCV: 94 fL (ref 79–97)
Monocytes Absolute: 0.4 10*3/uL (ref 0.1–0.9)
Monocytes: 9 %
Neutrophils Absolute: 2.3 10*3/uL (ref 1.4–7.0)
Neutrophils: 53 %
Platelets: 314 10*3/uL (ref 150–450)
RBC: 4.2 x10E6/uL (ref 3.77–5.28)
RDW: 14 % (ref 11.7–15.4)
WBC: 4.2 10*3/uL (ref 3.4–10.8)

## 2024-06-06 ENCOUNTER — Other Ambulatory Visit: Payer: Self-pay

## 2024-06-06 NOTE — Patient Outreach (Signed)
 Complex Care Management   Visit Note  06/06/2024  Name:  Kathryn Stein MRN: [id number] DOB: 1945-07-15  Situation: Referral received for Complex Care Management related to Dementia I obtained verbal consent from Patient.  Visit completed with Patient  on the phone. LCSW called patient to follow up in regards to recent PCP visit on 06/03/2024. Patient reports that weight loss is less than patient had originally thought. It was decided that LCSW would call daughter at next visit to complete a check in about patient. Patient has been put on meals on wheels waiting list.  Background:   Past Medical History:  Diagnosis Date   Abnormal heart rate    after birth of 3rd child   Anemia    Anorexia    Anxiety    Arthritis    Colon polyps    Depression    Dyspepsia    Gastritis    found on EGD   GERD (gastroesophageal reflux disease)    Hyperlipidemia    Hypertension    Marital relationship problem    Osteoporosis    Postmenopausal    Vertigo    Vertigo due to previous cerebellar infarction     Assessment: Patient Reported Symptoms:  Cognitive Cognitive Status: Struggling with memory recall, Requires Assistance Decision Making, Normal speech and language skills, Confused or disoriented Cognitive/Intellectual Conditions Management [RPT]: None reported or documented in medical history or problem list      Neurological Neurological Review of Symptoms: Not assessed    HEENT HEENT Symptoms Reported: Not assessed      Cardiovascular Cardiovascular Symptoms Reported: No symptoms reported Does patient have uncontrolled Hypertension?: No (Patient reports a high blood pressure reading at PCP visit) Cardiovascular Management Strategies: Adequate rest, Medication therapy  Respiratory Respiratory Symptoms Reported: Not assesed    Endocrine Endocrine Symptoms Reported: Not assessed    Gastrointestinal Gastrointestinal Symptoms Reported: Unintentional weight loss Additional  Gastrointestinal Details: Patient reports that weight loss was less than she thought      Genitourinary Genitourinary Symptoms Reported: Not assessed    Integumentary Integumentary Symptoms Reported: Not assessed    Musculoskeletal Musculoskelatal Symptoms Reviewed: Not assessed        Psychosocial Psychosocial Symptoms Reported: No symptoms reported Additional Psychological Details: Patient reports no mental health symptoms Behavioral Management Strategies: Medication therapy        06/06/2024    PHQ2-9 Depression Screening   Little interest or pleasure in doing things    Feeling down, depressed, or hopeless    PHQ-2 - Total Score    Trouble falling or staying asleep, or sleeping too much    Feeling tired or having little energy    Poor appetite or overeating     Feeling bad about yourself - or that you are a failure or have let yourself or your family down    Trouble concentrating on things, such as reading the newspaper or watching television    Moving or speaking so slowly that other people could have noticed.  Or the opposite - being so fidgety or restless that you have been moving around a lot more than usual    Thoughts that you would be better off dead, or hurting yourself in some way    PHQ2-9 Total Score    If you checked off any problems, how difficult have these problems made it for you to do your work, take care of things at home, or get along with other people    Depression Interventions/Treatment  There were no vitals filed for this visit.    Medications Reviewed Today     Reviewed by Sherren Olam FORBES KEN (Social Worker) on 06/06/24 at 1003  Med List Status: <None>   Medication Order Taking? Sig Documenting Provider Last Dose Status Informant  amLODipine  (NORVASC ) 10 MG tablet [id number]  Take 1 tablet (10 mg total) by mouth daily. Lavell Lye A, FNP  Active   aspirin  EC 81 MG tablet [id number]  Take 1 tablet (81 mg total) by mouth daily as needed. For  pain Lavell Lye A, FNP  Active   atorvastatin  (LIPITOR) 20 MG tablet [id number]  Take 1 tablet (20 mg total) by mouth daily. Lavell Lye A, FNP  Active   baclofen  (LIORESAL ) 10 MG tablet [id number]  Take 1 tablet (10 mg total) by mouth 3 (three) times daily. Lavell Lye A, FNP  Active   cholecalciferol (VITAMIN D3) 25 MCG (1000 UNIT) tablet [id number]  Take 1,000 Units by mouth daily.  Patient not taking: Reported on 06/03/2024   [provider]  Active   citalopram  (CELEXA ) 20 MG tablet [id number]  Take 1 tablet (20 mg total) by mouth daily. Lavell Lye A, FNP  Active   diclofenac  Sodium (VOLTAREN ) 1 % GEL [id number]  Apply 2 g topically 4 (four) times daily.  Patient not taking: Reported on 06/03/2024   Lavell Lye LABOR, FNP  Active   donepezil  (ARICEPT ) 10 MG tablet [id number]  Take 1 tablet (10 mg total) by mouth at bedtime. Lavell Lye A, FNP  Active   memantine  (NAMENDA ) 10 MG tablet [id number]  Take 1 tablet (10 mg total) by mouth 2 (two) times daily. Lavell Lye A, FNP  Active   mirtazapine  (REMERON ) 7.5 MG tablet [id number]  Take 1 tablet (7.5 mg total) by mouth at bedtime.  Patient not taking: Reported on 06/03/2024   Lavell Lye LABOR, FNP  Active   ondansetron  (ZOFRAN ) 4 MG tablet [id number]  Take 1 tablet (4 mg total) by mouth every 8 (eight) hours as needed for nausea or vomiting.  Patient not taking: Reported on 06/03/2024   Deitra Morton Sebastian Nena, NP  Active   pantoprazole  (PROTONIX ) 20 MG tablet [id number]  Take 1 tablet (20 mg total) by mouth daily. Lavell Lye LABOR, FNP  Active             Recommendation:   PCP Follow-up 06/21/2024 Continue Current Plan of Care  Follow Up Plan:   Telephone follow-up 06/20/2024 at 10:00 am  Olam Sherren, MSW, [facility]  [facility], [facility] Licensed Clinical Social Worker Direct Dial: [phone]

## 2024-06-07 ENCOUNTER — Telehealth: Payer: Self-pay

## 2024-06-07 NOTE — Progress Notes (Unsigned)
 Complex Care Management Care Guide Note  06/07/2024 Name: Kathryn Stein MRN: [id number] DOB: 1945-05-27  Kathryn Stein is a 79 y.o. year old female who is a primary care patient of Lavell Bari LABOR, FNP and is actively engaged with the care management team. I reached out to Kathryn Stein by phone today to assist with re-scheduling  with the RN Case Manager.  Follow up plan: Unsuccessful telephone outreach attempt made. A HIPAA compliant phone message was left for the patient providing contact information and requesting a return call.  Jeoffrey Buffalo , RMA     [facility]  [facility], [facility] Guide  Direct Dial: [phone]  Website: [email or web address]

## 2024-06-09 ENCOUNTER — Ambulatory Visit: Payer: Self-pay | Admitting: Family

## 2024-06-09 NOTE — Progress Notes (Signed)
 Complex Care Management Care Guide Note  06/09/2024 Name: Kathryn Stein MRN: [id number] DOB: 02-06-1946  Kathryn Stein is a 79 y.o. year old female who is a primary care patient of Lavell Bari LABOR, FNP and is actively engaged with the care management team. I reached out to Kathryn Stein by phone today to assist with re-scheduling  with the RN Case Manager.  Follow up plan: Telephone appointment with complex care management team member scheduled for:  06/24/2024  Jeoffrey Buffalo , RMA     [place]  [facility], [facility] Guide  Direct Dial: [phone]  Website: [email or web address]

## 2024-06-20 ENCOUNTER — Telehealth

## 2024-06-21 ENCOUNTER — Other Ambulatory Visit

## 2024-06-21 ENCOUNTER — Ambulatory Visit: Admitting: Family

## 2024-06-24 ENCOUNTER — Telehealth: Payer: Self-pay | Admitting: *Deleted

## 2025-04-20 ENCOUNTER — Ambulatory Visit
